# Patient Record
Sex: Male | Born: 1990 | Race: White | Hispanic: No | Marital: Single | Smoking: Current every day smoker
Health system: Southern US, Community
[De-identification: ages and names within clinical notes are randomized; demographics above are authoritative.]

## PROBLEM LIST (undated history)

## (undated) DIAGNOSIS — F1910 Other psychoactive substance abuse, uncomplicated: Secondary | ICD-10-CM

## (undated) DIAGNOSIS — F329 Major depressive disorder, single episode, unspecified: Secondary | ICD-10-CM

## (undated) DIAGNOSIS — R569 Unspecified convulsions: Secondary | ICD-10-CM

## (undated) DIAGNOSIS — F32A Depression, unspecified: Secondary | ICD-10-CM

## (undated) DIAGNOSIS — F419 Anxiety disorder, unspecified: Secondary | ICD-10-CM

## (undated) DIAGNOSIS — F1120 Opioid dependence, uncomplicated: Secondary | ICD-10-CM

## (undated) DIAGNOSIS — F1110 Opioid abuse, uncomplicated: Secondary | ICD-10-CM

## (undated) HISTORY — DX: Other psychoactive substance abuse, uncomplicated: F19.10

## (undated) HISTORY — PX: NO PAST SURGERIES: SHX2092

---

## 2015-12-03 ENCOUNTER — Encounter (HOSPITAL_COMMUNITY): Payer: Self-pay | Admitting: *Deleted

## 2015-12-03 ENCOUNTER — Observation Stay (HOSPITAL_COMMUNITY)
Admission: EM | Admit: 2015-12-03 | Discharge: 2015-12-03 | Disposition: A | Discharge status: Home / Self Care | Payer: Self-pay | Attending: Internal Medicine | Admitting: Internal Medicine

## 2015-12-03 DIAGNOSIS — F17210 Nicotine dependence, cigarettes, uncomplicated: Secondary | ICD-10-CM | POA: Insufficient documentation

## 2015-12-03 DIAGNOSIS — G928 Other toxic encephalopathy: Secondary | ICD-10-CM

## 2015-12-03 DIAGNOSIS — G929 Unspecified toxic encephalopathy: Secondary | ICD-10-CM | POA: Diagnosis present

## 2015-12-03 DIAGNOSIS — G92 Toxic encephalopathy: Secondary | ICD-10-CM | POA: Insufficient documentation

## 2015-12-03 DIAGNOSIS — T50902A Poisoning by unspecified drugs, medicaments and biological substances, intentional self-harm, initial encounter: Secondary | ICD-10-CM | POA: Diagnosis present

## 2015-12-03 DIAGNOSIS — T50992A Poisoning by other drugs, medicaments and biological substances, intentional self-harm, initial encounter: Principal | ICD-10-CM | POA: Insufficient documentation

## 2015-12-03 LAB — RAPID URINE DRUG SCREEN, HOSP PERFORMED
AMPHETAMINES: NOT DETECTED
BENZODIAZEPINES: NOT DETECTED
Barbiturates: NOT DETECTED
Cocaine: NOT DETECTED
Opiates: NOT DETECTED
TETRAHYDROCANNABINOL: NOT DETECTED

## 2015-12-03 LAB — COMPREHENSIVE METABOLIC PANEL
ALBUMIN: 4.8 g/dL (ref 3.5–5.0)
ALK PHOS: 57 U/L (ref 38–126)
ALT: 9 U/L — AB (ref 17–63)
AST: 19 U/L (ref 15–41)
Anion gap: 10 (ref 5–15)
BUN: 13 mg/dL (ref 6–20)
CALCIUM: 9.3 mg/dL (ref 8.9–10.3)
CHLORIDE: 105 mmol/L (ref 101–111)
CO2: 25 mmol/L (ref 22–32)
CREATININE: 1.08 mg/dL (ref 0.61–1.24)
GFR calc non Af Amer: >60 mL/min (ref >60)
GLUCOSE: 117 mg/dL — AB (ref 65–99)
Potassium: 4.3 mmol/L (ref 3.5–5.1)
SODIUM: 140 mmol/L (ref 135–145)
Total Bilirubin: 0.7 mg/dL (ref 0.3–1.2)
Total Protein: 7.7 g/dL (ref 6.5–8.1)

## 2015-12-03 LAB — CBC WITH DIFFERENTIAL/PLATELET
BASOS ABS: 0 10*3/uL (ref 0.0–0.1)
Basophils Relative: 0 %
EOS ABS: 0 10*3/uL (ref 0.0–0.7)
Eosinophils Relative: 0 %
HCT: 46.2 % (ref 39.0–52.0)
HEMOGLOBIN: 15.9 g/dL (ref 13.0–17.0)
LYMPHS ABS: 1.1 10*3/uL (ref 0.7–4.0)
Lymphocytes Relative: 19 %
MCH: 30.7 pg (ref 26.0–34.0)
MCHC: 34.4 g/dL (ref 30.0–36.0)
MCV: 89.2 fL (ref 78.0–100.0)
Monocytes Absolute: 0.4 10*3/uL (ref 0.1–1.0)
Monocytes Relative: 7 %
NEUTROS PCT: 74 %
Neutro Abs: 4.3 10*3/uL (ref 1.7–7.7)
Platelets: 247 10*3/uL (ref 150–400)
RBC: 5.18 MIL/uL (ref 4.22–5.81)
RDW: 12.1 % (ref 11.5–15.5)
WBC: 5.8 10*3/uL (ref 4.0–10.5)

## 2015-12-03 LAB — I-STAT CG4 LACTIC ACID, ED: Lactic Acid, Venous: 0.71 mmol/L (ref 0.5–2.0)

## 2015-12-03 LAB — ETHANOL: Alcohol, Ethyl (B): <5 mg/dL (ref <5)

## 2015-12-03 LAB — ACETAMINOPHEN LEVEL

## 2015-12-03 LAB — SALICYLATE LEVEL

## 2015-12-03 MED ORDER — SODIUM CHLORIDE 0.9 % IV SOLN
INTRAVENOUS | Status: DC
  Administered 2015-12-03: 08:00:00 via INTRAVENOUS

## 2015-12-03 MED ORDER — SODIUM CHLORIDE 0.9 % IV BOLUS (SEPSIS)
1000.0000 mL | Freq: Once | INTRAVENOUS | Status: AC
  Administered 2015-12-03: 1000 mL via INTRAVENOUS

## 2015-12-03 MED ORDER — ONDANSETRON HCL 4 MG/2ML IJ SOLN: 4.0000 mg | Freq: Four times a day (QID) | INTRAMUSCULAR | Status: DC | PRN

## 2015-12-03 MED ORDER — ENOXAPARIN SODIUM 40 MG/0.4ML ~~LOC~~ SOLN
40.0000 mg | SUBCUTANEOUS | Status: DC
  Filled 2015-12-03: qty 0.4

## 2015-12-03 MED ORDER — ALBUTEROL SULFATE (2.5 MG/3ML) 0.083% IN NEBU: 2.5000 mg | INHALATION_SOLUTION | RESPIRATORY_TRACT | Status: DC | PRN

## 2015-12-03 NOTE — ED Notes (Signed)
Pt comes to the ER for evaluation for ingesting a medication he ordered from a [facility]; pt states that he thinks it is "crystalized gabapentin"; pt staying at [facility] for [facility]; friends at house advised that pt was "not acting himself and was hallucinating"; pt states that it was liquid and he drank it; pt denies SI / HI; pt denies hallucinating at present; pt denies pain

## 2015-12-03 NOTE — Discharge Summary (Signed)
Mark Strickland, is a 24 y.o. male  DOB 23-Jul-1991  MRN [id number].  Admission date:  12/03/2015  Admitting Physician  Hillary BowJared M Gardner, DO  Discharge Date:  12/03/2015   Primary MD  No primary care provider on file.  Recommendations for primary care physician for things to follow:   Monitor recreational and prescription drug abuse   Admission Diagnosis  Drug overdose, intentional, initial encounter (HCC) [T50.902A] Drug-induced encephalopathy [G92]   Discharge Diagnosis  Drug overdose, intentional, initial encounter (HCC) [T50.902A] Drug-induced encephalopathy [G92]     Principal Problem:   Encephalopathy, toxic Active Problems:   Drug overdose, intentional (HCC)      History reviewed. No pertinent past medical history.  History reviewed. No pertinent past surgical history.     HPI  from the history and physical done on the day of admission:    Mark Strickland is a 24 y.o. male, with no past medical or surgical history except substance abuse, who was living in a group home which is called [facility] with his friends, apparently they ordered a mail order medication from [place] called Phenibut and he and his friend intentionally overdosed on this medication to get high, they had no intentions of hurting themselves whatsoever, he denies any depression, suicidal or homicidal ideation.  Thereafter he became more somnolent and was brought to the ER, in the ER his EKG, lab work were all unremarkable, liver panel was stable, salicylate and acetaminophen level were normal, ER physician discussed his case with poison control who recommended 36 hours monitoring for bradycardia and somnolence. Patient currently is minimally somnolent, easily arousable answering all questions and following all commands. Completely  symptom free. Again he denies any suicidal intent or ideation.     Hospital Course:     1. Intentional, recreational prescription medication overdose with Phenibut - per poison control this medication acts similarly to Neurontin - patient with a few other supportive care back to baseline, alert awake oriented 3, no problems or subjective complaints whatsoever. He again denies any suicidal ideation or intent. I also called and talked to his mother in detail. She agrees that this was recreational. He wishes to be discharged right away as he has to go for a job. Nurse as a witness in the room. He will be discharged with social work to counsel him prior to his discharge. I have consult him as well again states using any recreational drugs or abusing prescription drugs. He is already living at the addiction facility and he intends to go back there. He does not use this drug chronically he says. And it was given to him by a friend yesterday.  His case was closed by poison control as well.      Discharge Condition: Stable  Follow UP  Follow-up Information    Follow up with PCP and Psychiatry. Schedule an appointment as soon as possible for a visit in 1 week.       Consults obtained - S Work  Diet and Activity recommendation: See Discharge  Instructions below  Discharge Instructions       Discharge Instructions    Diet - low sodium heart healthy    Complete by:  As directed      Discharge instructions    Complete by:  As directed   Follow with Primary MD in 7 days   Get CBC, CMP, 2 view Chest X ray checked  by Primary MD next visit.    Activity: As tolerated with Full fall precautions use walker/cane & assistance as needed   Disposition Home     Diet:   Heart Healthy    For Heart failure patients - Check your Weight same time everyday, if you gain over 2 pounds, or you develop in leg swelling, experience more shortness of breath or chest pain, call your Primary MD immediately.  Follow Cardiac Low Salt Diet and 1.5 lit/day fluid restriction.   On your next visit with your primary care physician please Get Medicines reviewed and adjusted.   Please request your Prim.MD to go over all Hospital Tests and Procedure/Radiological results at the follow up, please get all Hospital records sent to your Prim MD by signing hospital release before you go home.   If you experience worsening of your admission symptoms, develop shortness of breath, life threatening emergency, suicidal or homicidal thoughts you must seek medical attention immediately by calling 911 or calling your MD immediately  if symptoms less severe.  You Must read complete instructions/literature along with all the possible adverse reactions/side effects for all the Medicines you take and that have been prescribed to you. Take any new Medicines after you have completely understood and accpet all the possible adverse reactions/side effects.   Do not drive, operating heavy machinery, perform activities at heights, swimming or participation in water activities or provide baby sitting services if your were admitted for syncope or siezures until you have seen by Primary MD or a Neurologist and advised to do so again.  Do not drive when taking Pain medications.    Do not take more than prescribed Pain, Sleep and Anxiety Medications  Special Instructions: If you have smoked or chewed Tobacco  in the last 2 yrs please stop smoking, stop any regular Alcohol  and or any Recreational drug use.  Wear Seat belts while driving.   Please note  You were cared for by a hospitalist during your hospital stay. If you have any questions about your discharge medications or the care you received while you were in the hospital after you are discharged, you can call the unit and asked to speak with the hospitalist on call if the hospitalist that took care of you is not available. Once you are discharged, your primary care physician  will handle any further medical issues. Please note that NO REFILLS for any discharge medications will be authorized once you are discharged, as it is imperative that you return to your primary care physician (or establish a relationship with a primary care physician if you do not have one) for your aftercare needs so that they can reassess your need for medications and monitor your lab values.     Increase activity slowly    Complete by:  As directed              Discharge Medications       Medication List    Notice    You have not been prescribed any medications.      Major procedures and Radiology Reports - PLEASE review detailed  and final reports for all details, in brief -    No results found.  Micro Results    No results found for this or any previous visit (from the past 240 hour(s)).  Today   Subjective    Mark Strickland today has no headache,no chest abdominal pain,no new weakness tingling or numbness, feels much better wants to go home today.     Objective   Blood pressure 123/70, pulse 83, temperature 98 F (36.7 C), temperature source Oral, resp. rate 15, weight 74.4 kg (164 lb 0.4 oz), SpO2 97 %.  No intake or output data in the 24 hours ending 12/03/15 1201  Exam Awake Alert, Oriented x 3, No new F.N deficits, Normal affect [place].AT,PERRAL Supple Neck,No JVD, No cervical lymphadenopathy appriciated.  Symmetrical Chest wall movement, Good air movement bilaterally, CTAB RRR,No Gallops,Rubs or new Murmurs, No Parasternal Heave +ve B.Sounds, Abd Soft, Non tender, No organomegaly appriciated, No rebound -guarding or rigidity. No Cyanosis, Clubbing or edema, No new Rash or bruise   Data Review   CBC w Diff:  Lab Results  Component Value Date   WBC 5.8 12/03/2015   HGB 15.9 12/03/2015   HCT 46.2 12/03/2015   PLT 247 12/03/2015   LYMPHOPCT 19 12/03/2015   MONOPCT 7 12/03/2015   EOSPCT 0 12/03/2015   BASOPCT 0 12/03/2015    CMP:  Lab Results    Component Value Date   NA 140 12/03/2015   K 4.3 12/03/2015   CL 105 12/03/2015   CO2 25 12/03/2015   BUN 13 12/03/2015   CREATININE 1.08 12/03/2015   PROT 7.7 12/03/2015   ALBUMIN 4.8 12/03/2015   BILITOT 0.7 12/03/2015   ALKPHOS 57 12/03/2015   AST 19 12/03/2015   ALT 9* 12/03/2015  .   Total Time in preparing paper work, data evaluation and todays exam - 35 minutes  Leroy Sea M.D on 12/03/2015 at 12:01 PM  [facility]   Office  [phone]

## 2015-12-03 NOTE — ED Provider Notes (Signed)
Patient presents from [facility] with a roommate after taking phenibut which they ordered online from [place]. They're roommates state they took it about 5 PM.  Patient is lying quietly in the stretcher. When he is addressed he keeps repeating the same sentence over and over again which sounds like "they went that away". When asked if he knows where he is he states yes however he states the same sentence over again.  Medical screening examination/treatment/procedure(s) were conducted as a shared visit with non-physician practitioner(s) and myself.  I personally evaluated the patient during the encounter.   EKG Interpretation   Date/Time:  Thursday December 03 2015 02:15:25 EST Ventricular Rate:  67 PR Interval:  128 QRS Duration: 97 QT Interval:  432 QTC Calculation: 456 R Axis:   76 Text Interpretation:  Sinus rhythm Baseline wander in lead(s) III aVL aVF  Otherwise within normal limits No old tracing to compare Confirmed by  Hermenegildo Clausen  MD-I, Kamarius Buckbee ([id number]) on 12/03/2015 2:25:49 AM       Devoria AlbeIva Deigo Alonso, MD, Concha PyoFACEP   Analiese Krupka, MD 12/03/15 [phone]

## 2015-12-03 NOTE — ED Notes (Signed)
Pt. Made aware for the need of urine. 

## 2015-12-03 NOTE — Progress Notes (Signed)
Mark Strickland to be D/C'd Home per MD order.  Discussed prescriptions and follow up appointments with the patient. Prescriptions given to patient, medication list explained in detail. Pt verbalized understanding.    Medication List    Notice    You have not been prescribed any medications.      Filed Vitals:   12/03/15 0600 12/03/15 0653  BP: 112/70 123/70  Pulse: 70 83  Temp:  98 F (36.7 C)  Resp: 11 15    Skin clean, dry and intact without evidence of skin break down, no evidence of skin tears noted. IV catheter discontinued intact. Site without signs and symptoms of complications. Dressing and pressure applied. Pt denies pain at this time. No complaints noted.  An After Visit Summary was printed and given to the patient. Patient escorted via WC, and D/C home via private auto.  Mark Strickland, Mark Strickland 12/03/2015 12:25 PM

## 2015-12-03 NOTE — H&P (Signed)
Patient Demographics:    Mark Strickland, is a 24 y.o. male  MRN: [id number]   DOB - [birth year]  Admit Date - 12/03/2015  Outpatient Primary MD for the patient is No primary care provider on file.   With History of -  History reviewed. No pertinent past medical history.    History reviewed. No pertinent past surgical history.  in for   Chief Complaint  Patient presents with  . Ingestion      HPI:    Mark Strickland  is a 24 y.o. male, with no past medical or surgical history except substance abuse, who was living in a group home which is called [facility] with his friends, apparently they ordered a mail order medication from [place] called Phenibut and he and his friend intentionally overdosed on this medication to get high, they had no intentions of hurting themselves whatsoever, he denies any depression, suicidal or homicidal ideation.  Thereafter he became more somnolent and was brought to the ER, in the ER his EKG, lab work were all unremarkable, liver panel was stable, salicylate and acetaminophen level were normal, ER physician discussed his case with poison control who recommended 36 hours monitoring for bradycardia and somnolence. Patient currently is minimally somnolent, easily arousable answering all questions and following all commands. Completely symptom free. Again he denies any suicidal intent or ideation.    Review of systems:    In addition to the HPI above,  No Fever-chills, No Headache, No changes with Vision or hearing, No problems swallowing food or Liquids, No Chest pain, Cough or Shortness of Breath, No Abdominal pain, No Nausea or Vommitting, Bowel movements are regular, No Blood in stool or Urine, No dysuria, No new skin rashes or bruises, No new joints pains-aches,  No new  weakness, tingling, numbness in any extremity, No recent weight gain or loss, No polyuria, polydypsia or polyphagia, No significant Mental Stressors.  A full 10 point Review of Systems was done, except as stated above, all other Review of Systems were negative.    Social History:     Social History  Substance Use Topics  . Smoking status: Current Every Day Smoker -- 1.00 packs/day    Types: Cigarettes  . Smokeless tobacco: Not on file  . Alcohol Use: Yes    Lives - at a sober house with friends      Family History :   Denies any depression history   Home Medications:   Prior to Admission medications   Not on File     Allergies:    No Known Allergies   Physical Exam:   Vitals  Blood pressure 123/70, pulse 83, temperature 98 F (36.7 C), temperature source Oral, resp. rate 15, weight 74.4 kg (164 lb 0.4 oz), SpO2 97 %.   1. General young white male lying in bed in NAD, mildly somnolent  2. Normal affect and insight, Not Suicidal or Homicidal, Awake  Alert, Oriented X 3.  3. No F.N deficits, ALL C.Nerves Intact, Strength 5/5 all 4 extremities, Sensation intact all 4 extremities, Plantars down going.  4. Ears and Eyes appear Normal, Conjunctivae clear, PERRLA. Moist Oral Mucosa.  5. Supple Neck, No JVD, No cervical lymphadenopathy appriciated, No Carotid Bruits.  6. Symmetrical Chest wall movement, Good air movement bilaterally, CTAB.  7. RRR, No Gallops, Rubs or Murmurs, No Parasternal Heave.  8. Positive Bowel Sounds, Abdomen Soft, No tenderness, No organomegaly appriciated,No rebound -guarding or rigidity.  9.  No Cyanosis, Normal Skin Turgor, No Skin Rash or Bruise.  10. Good muscle tone,  joints appear normal , no effusions, Normal ROM.  11. No Palpable Lymph Nodes in Neck or Axillae     Data Review:    CBC  Recent Labs Lab 12/03/15 0234  WBC 5.8  HGB 15.9  HCT 46.2  PLT 247  MCV 89.2  MCH 30.7  MCHC 34.4  RDW 12.1  LYMPHSABS 1.1    MONOABS 0.4  EOSABS 0.0  BASOSABS 0.0   ------------------------------------------------------------------------------------------------------------------  Chemistries   Recent Labs Lab 12/03/15 0234  NA 140  K 4.3  CL 105  CO2 25  GLUCOSE 117*  BUN 13  CREATININE 1.08  CALCIUM 9.3  AST 19  ALT 9*  ALKPHOS 57  BILITOT 0.7   ------------------------------------------------------------------------------------------------------------------ CrCl cannot be calculated (Unknown ideal weight.). ------------------------------------------------------------------------------------------------------------------ No results for input(s): TSH, T4TOTAL, T3FREE, THYROIDAB in the last 72 hours.  Invalid input(s): FREET3   Coagulation profile No results for input(s): INR, PROTIME in the last 168 hours. ------------------------------------------------------------------------------------------------------------------- No results for input(s): DDIMER in the last 72 hours. -------------------------------------------------------------------------------------------------------------------  Cardiac Enzymes No results for input(s): CKMB, TROPONINI, MYOGLOBIN in the last 168 hours.  Invalid input(s): CK ------------------------------------------------------------------------------------------------------------------ Invalid input(s): POCBNP   ---------------------------------------------------------------------------------------------------------------  Urinalysis No results found for: COLORURINE, APPEARANCEUR, LABSPEC, PHURINE, GLUCOSEU, HGBUR, BILIRUBINUR, KETONESUR, PROTEINUR, UROBILINOGEN, NITRITE, LEUKOCYTESUR  ----------------------------------------------------------------------------------------------------------------   Imaging Results:    No results found.  My personal review of EKG: Rhythm NSR,  no Acute ST changes   Assessment & Plan:     1. Intentional, recreational  prescription medication overdose with Phenibut - per poison control this medication acts similarly to Neurontin, patient to be monitored for 36 hours for somnolence and bradycardia. We will observe him for his 3 hours with IV fluids, keep him on clear liquids still he is more awake. Monitor on telemetry. Denies any suicidal ideation or intent. Counseled not to abuse medications. Likely discharge in the morning.   DVT Prophylaxis  Lovenox   AM Labs Ordered, also please review Full Orders  Family Communication: Admission, patients condition and plan of care including tests being ordered have been discussed with the patient who indicates understanding and agree with the plan and Code Status.  Code Status Full  Likely DC to  Home 1-2 days  Condition Fair  Time spent in minutes : 35    SINGH,PRASHANT K M.D on 12/03/2015 at 8:33 AM  Between 7am to 7pm - Pager - [phone]  After 7pm go to [email or web address] - password [email or web address]  [facility] - Office  [phone]

## 2015-12-03 NOTE — ED Notes (Addendum)
Spoke with Lorri from Poison Control, monitor -Medical clearance labs -EKG -Seizure precautions -Benzo's for any seizure activities -Lorri will call back with further information 

## 2015-12-03 NOTE — ED Provider Notes (Signed)
CSN: [id number]     Arrival date & time 12/03/15  0200 History   First MD Initiated Contact with Patient 12/03/15 0203     Chief Complaint  Patient presents with  . Ingestion    Level V caveat applies secondary to altered mental status from overdose  (Consider location/radiation/quality/duration/timing/severity/associated sxs/prior Treatment) HPI Comments: 24 year old male with no known past medical history presents to the emergency department secondary to overdose. Patient reports ingesting "crystallized gabapentin". It was later discovered from friends with whom the patient lives that he took a large amount of Phenibut. Friends report the patient taking "about 10 scoops mixed with water". Patient reports that ingestion was 8 hours ago. Friends state that he ingested Phenibut at approximately 1700 yesterday. Patient denies pain or hallucinations. He states he "doesn't know" why he ingested this, but he denies SI. Friends report that the patient is on multiple medications for depression and PTSD. Patient denies hospitalizations for such. He reports ETOH ingestion tonight, but friends "doubt it".   Patient is a 24 y.o. male presenting with Ingested Medication. The history is provided by the patient and a friend. No language interpreter was used.  Ingestion Pertinent negatives include no abdominal pain or chest pain.    History reviewed. No pertinent past medical history. History reviewed. No pertinent past surgical history. No family history on file. Social History  Substance Use Topics  . Smoking status: Current Every Day Smoker -- 1.00 packs/day    Types: Cigarettes  . Smokeless tobacco: None  . Alcohol Use: Yes    Review of Systems  Unable to perform ROS: Mental status change  Respiratory: Negative for shortness of breath.   Cardiovascular: Negative for chest pain.  Gastrointestinal: Negative for abdominal pain.  Psychiatric/Behavioral: Negative for suicidal ideas.  AMS  secondary to overdose   Allergies  Review of patient's allergies indicates no known allergies.  Home Medications   Prior to Admission medications   Not on File   BP 121/85 mmHg  Pulse 66  Temp(Src) 97.5 F (36.4 C) (Oral)  Resp 15  SpO2 97%   Physical Exam  Constitutional: He is oriented to person, place, and time. He appears well-developed and well-nourished. No distress.  Nontoxic/nonseptic appearing  HENT:  Head: Normocephalic and atraumatic.  Eyes: Conjunctivae and EOM are normal. No scleral icterus.  Dilated, reactive pupils b/l  Neck: Normal range of motion.  Cardiovascular: Normal rate, regular rhythm and intact distal pulses.   Pulmonary/Chest: Effort normal. No respiratory distress.  Respirations even and unlabored  Abdominal: Soft. He exhibits no distension. There is no tenderness.  Soft, nontender abdomen  Musculoskeletal: Normal range of motion.  Neurological: He is alert and oriented to person, place, and time. He exhibits normal muscle tone. Coordination normal.  Speech is goal oriented. Patient ambulatory, unassisted.  Skin: Skin is warm and dry. No rash noted. He is not diaphoretic. No erythema. No pallor.  Psychiatric: His speech is normal and behavior is normal. He exhibits a depressed mood. He expresses no homicidal and no suicidal ideation.  Patient appears dazed.  Nursing note and vitals reviewed.   ED Course  Procedures (including critical care time) Labs Review Labs Reviewed  COMPREHENSIVE METABOLIC PANEL - Abnormal; Notable for the following:    Glucose, Bld 117 (*)    ALT 9 (*)    All other components within normal limits  ACETAMINOPHEN LEVEL - Abnormal; Notable for the following:    Acetaminophen (Tylenol), Serum <10 (*)    All other components  within normal limits  CBC WITH DIFFERENTIAL/PLATELET  ETHANOL  SALICYLATE LEVEL  URINE RAPID DRUG SCREEN, HOSP PERFORMED  I-STAT CG4 LACTIC ACID, ED  I-STAT CG4 LACTIC ACID, ED    Imaging  Review No results found.   I have personally reviewed and evaluated these images and lab results as part of my medical decision-making.   EKG Interpretation   Date/Time:  Thursday December 03 2015 02:15:25 EST Ventricular Rate:  67 PR Interval:  128 QRS Duration: 97 QT Interval:  432 QTC Calculation: 456 R Axis:   76 Text Interpretation:  Sinus rhythm Baseline wander in lead(s) III aVL aVF  Otherwise within normal limits No old tracing to compare Confirmed by  KNAPP  MD-I, IVA ([id number]) on 12/03/2015 2:25:49 AM      0230 - Spoke with Patty of poison control. She reports that there is no concern for cardiac toxicity. She states that there are few cases of overdose on this substance, that supportive care is usually adequate. Poison control recommends admission to telemetry on pulse oximetry if patient is not back to baseline within 6 hours postingestion. Phenibut has been known to modulate the function of some epilepsy medications. Benzodiazepines recommended for any seizure-like activity.  [id number] - Patient easily arousable to voice. His speech is clear, though he still seems a bit altered; "dazed". Consulted with hospitalist for admission. Dr. Julian ReilGardner to assess; however on further thought, I believe the patient may be appropriate for discharge closer to 0600 if allowed to further sober and return to baseline.  Vestalis.[email or web address] - Patient has been reassessed. He will answer "yes"/"no" questions, but will freely speak in garbled speech that is nonsensical. I believe the patient has worsened since his arrival in the ED. I have spoken to Dr. Julian ReilGardner about this patient and his worsening condition as he is performing duties in our ED physician room. He will attempt to assess patient for admission. Patient with no respiratory distress or depression. No airway compromise.  MDM   Final diagnoses:  Drug overdose, intentional, initial encounter (HCC)  Drug-induced encephalopathy    Patient presenting for  Phenibut overdose. Ingestion at approximately 1700. Patient denies SI. He is in no respiratory distress and has no airway compromise, though his mentation and speech have worsened during ED observation. Will admit for monitoring until drug metabolized; can take up to 36 hours per poison control. Medication works similar to gabapentin and baclofen, per research. No concern for cardiac toxicity though bradycardia has been noted to be associated with overdose. Per poison control, intubation is a RARE occurrence and supportive care is usually adequate.   Filed Vitals:   12/03/15 0219  BP: 121/85  Pulse: 66  Temp: 97.5 F (36.4 C)  TempSrc: Oral  Resp: 15  SpO2: 97%     Antony MaduraKelly Edwina Grossberg, PA-C 12/03/15 0530  Devoria AlbeIva Knapp, MD 12/03/15 [phone]

## 2015-12-03 NOTE — ED Notes (Signed)
RN called floor to give report; no answer

## 2015-12-03 NOTE — Clinical Social Work Note (Signed)
Clinical Social Work Assessment  Patient Details  Name: Mark Strickland MRN: [id number] Date of Birth: 03-08-1991  Date of referral:  12/03/15               Reason for consult:  Substance Use/ETOH Abuse                Permission sought to share information with:  Other (Not Applicable ) Permission granted to share information::  No  Name::        Agency::     Relationship::     Contact Information:     Housing/Transportation Living arrangements for the past 2 months:  [place] (Pt resides the [facility]) Source of Information:  Patient Patient Interpreter Needed:  None Criminal Activity/Legal Involvement Pertinent to Current Situation/Hospitalization:  No - Comment as needed Significant Relationships:  Parents Lives with:  Roommate Do you feel safe going back to the place where you live?  Yes Need for family participation in patient care:  No (Coment)  Care giving concerns: Pt does not have any care giving concerns at this time.   Social Worker assessment / plan: CSW met with the Pt at the bedside for assessment and d/c planning. CSW introduced self and reason for referral. Pt was very receptive to CSW and was willing to explain the circumstances for this admission. Pt began explaining reason for hospitalization and immediately became tearful. Pt stated that he was with his roommate yesterday evening after work. Pt stated that when he came home there was a package for his roommate on the doorstep and Pt brought the package in to his roommate. Pt denied any knowledge of what "herbal supplement" was in the package. Pt stated that Pt's roommate offered the Pt some of the supplement to assist with his anxiety. Pt's roommate "instructed the Pt on how much to take" and Pt did not read medication/herbal supplement label. Pt stated that medication/supplement was in a powder form and was to be mixed with water. Pt stated he added "10 one ounce scoops to water and drank it."Pt claimed he was,  "able to go to work and complete his shift." Pt stated he began to feel "uneasy" and began having some unsteadiness. Pt went home after his shift and thru up several times. Pt then stated he went to his nightly meeting. Pt was not able to remain at the meeting and returned home where he began throwing up again. Pt stated that while he was talking with someone at the [facility] he began having a difficult time speaking and "became worse." Pt allowed someone to call the ambulance for him. Pt was crying while describing the events and when CSW asked what was upsetting him, Pt explained that Pt did not want anyone to think he "relapsed." Pt did express several close relationships with the house members and is concerned with what their view of him might be. CSW provided solution focused interventions that will assist with interacting appropriately with those house members. Pt does want to continue with his sobriety and also wants to return to the [facility]. Pt denies any use of alcohol at the facility and while in the program. Pt is able to continue working and has strong relational bond with his mother. Pt is able to be d/c'd back to facility today. CSW sees no barriers to discharge.  Employment status:  [employer] information:  Self Pay (Medicaid Pending) PT Recommendations:  Not assessed at this time Information / Referral to community resources:  Other (Comment Required) (None needed as Pt will be returning to a treatment facility for further rehab and support with sobriety)  Patient/Family's Response to care: Pt is visibly distraught about the decision to take medication and was tearful throughout the assessment.   Patient/Family's Understanding of and Emotional Response to Diagnosis, Current Treatment, and Prognosis:  Pt understands the seriousness of what happened and "knows now not to trust his roommate."   Emotional Assessment Appearance:  Appears stated age Attitude/Demeanor/Rapport:   Crying, Other (Feeling shame and regret. Pt feels as though he will "be looked down on" when he returns. ) Affect (typically observed):  Anxious, Sad, Tearful/Crying Orientation:  Oriented to Self, Oriented to Place, Oriented to  Time, Oriented to Situation Alcohol / Substance use:  Illicit Drugs (Pt stated he believed "he had taken a herbal supliment and did not know , "it was what it was." Pt did not claim to know what the medication was he had taken. ) Psych involvement (Current and /or in the community):  No (Comment)  Discharge Needs  Concerns to be addressed:  Decision making concerns, Coping/Stress Concerns, Substance Abuse Concerns Readmission within the last 30 days:  No Current discharge risk:  None Barriers to Discharge:  No Barriers Identified   Mark Strickland 12/03/2015, 12:35 PM

## 2015-12-03 NOTE — Discharge Instructions (Signed)

## 2016-06-21 ENCOUNTER — Emergency Department
Admission: EM | Admit: 2016-06-21 | Discharge: 2016-06-21 | Disposition: A | Discharge status: Home / Self Care | Payer: MEDICAID | Attending: Emergency Medicine | Admitting: Emergency Medicine

## 2016-06-21 ENCOUNTER — Encounter: Payer: Self-pay | Admitting: *Deleted

## 2016-06-21 DIAGNOSIS — F17210 Nicotine dependence, cigarettes, uncomplicated: Secondary | ICD-10-CM | POA: Insufficient documentation

## 2016-06-21 DIAGNOSIS — Z79899 Other long term (current) drug therapy: Secondary | ICD-10-CM | POA: Insufficient documentation

## 2016-06-21 DIAGNOSIS — F1320 Sedative, hypnotic or anxiolytic dependence, uncomplicated: Secondary | ICD-10-CM

## 2016-06-21 DIAGNOSIS — F1120 Opioid dependence, uncomplicated: Secondary | ICD-10-CM | POA: Insufficient documentation

## 2016-06-21 DIAGNOSIS — F329 Major depressive disorder, single episode, unspecified: Secondary | ICD-10-CM | POA: Insufficient documentation

## 2016-06-21 HISTORY — DX: Anxiety disorder, unspecified: F41.9

## 2016-06-21 HISTORY — DX: Major depressive disorder, single episode, unspecified: F32.9

## 2016-06-21 HISTORY — DX: Depression, unspecified: F32.A

## 2016-06-21 LAB — CBC WITH DIFFERENTIAL/PLATELET
Basophils Absolute: 0 10*3/uL (ref 0–0.1)
Basophils Relative: 0 %
EOS ABS: 0 10*3/uL (ref 0–0.7)
Eosinophils Relative: 1 %
HCT: 39.8 % — ABNORMAL LOW (ref 40.0–52.0)
HEMOGLOBIN: 13.3 g/dL (ref 13.0–18.0)
LYMPHS ABS: 1.5 10*3/uL (ref 1.0–3.6)
LYMPHS PCT: 27 %
MCH: 29.5 pg (ref 26.0–34.0)
MCHC: 33.4 g/dL (ref 32.0–36.0)
MCV: 88.4 fL (ref 80.0–100.0)
Monocytes Absolute: 0.5 10*3/uL (ref 0.2–1.0)
Monocytes Relative: 10 %
NEUTROS PCT: 62 %
Neutro Abs: 3.4 10*3/uL (ref 1.4–6.5)
Platelets: 355 10*3/uL (ref 150–440)
RBC: 4.5 MIL/uL (ref 4.40–5.90)
RDW: 12.9 % (ref 11.5–14.5)
WBC: 5.5 10*3/uL (ref 3.8–10.6)

## 2016-06-21 LAB — URINE DRUG SCREEN, QUALITATIVE (ARMC ONLY)
AMPHETAMINES, UR SCREEN: NOT DETECTED
Barbiturates, Ur Screen: NOT DETECTED
Benzodiazepine, Ur Scrn: POSITIVE — AB
CANNABINOID 50 NG, UR ~~LOC~~: NOT DETECTED
COCAINE METABOLITE, UR ~~LOC~~: NOT DETECTED
MDMA (ECSTASY) UR SCREEN: NOT DETECTED
Methadone Scn, Ur: NOT DETECTED
Opiate, Ur Screen: POSITIVE — AB
Phencyclidine (PCP) Ur S: NOT DETECTED
TRICYCLIC, UR SCREEN: NOT DETECTED

## 2016-06-21 LAB — BASIC METABOLIC PANEL
ANION GAP: 8 (ref 5–15)
BUN: 20 mg/dL (ref 6–20)
CHLORIDE: 101 mmol/L (ref 101–111)
CO2: 28 mmol/L (ref 22–32)
Calcium: 8.6 mg/dL — ABNORMAL LOW (ref 8.9–10.3)
Creatinine, Ser: 1.53 mg/dL — ABNORMAL HIGH (ref 0.61–1.24)
GFR calc Af Amer: >60 mL/min (ref >60)
GFR calc non Af Amer: >60 mL/min (ref >60)
Glucose, Bld: 98 mg/dL (ref 65–99)
POTASSIUM: 4.2 mmol/L (ref 3.5–5.1)
SODIUM: 137 mmol/L (ref 135–145)

## 2016-06-21 LAB — ETHANOL: Alcohol, Ethyl (B): <5 mg/dL (ref <5)

## 2016-06-21 LAB — SALICYLATE LEVEL

## 2016-06-21 LAB — ACETAMINOPHEN LEVEL

## 2016-06-21 MED ORDER — ACETAMINOPHEN 500 MG PO TABS: 1000.0000 mg | ORAL_TABLET | Freq: Once | ORAL | Status: DC

## 2016-06-21 MED ORDER — NICOTINE 21 MG/24HR TD PT24
21.0000 mg | MEDICATED_PATCH | Freq: Once | TRANSDERMAL | Status: DC
  Administered 2016-06-21: 21 mg via TRANSDERMAL
  Filled 2016-06-21: qty 1

## 2016-06-21 NOTE — ED Notes (Signed)
States he is looking for medical clearance for [facility], states he is seeking detox for benzos and opiates, states he last took pills this AM

## 2016-06-21 NOTE — BH Assessment (Signed)
Tele Assessment Note   Mark Strickland is an 25 y.o. maleCaucasian, single who [facility] for medical clearance for acceptance detox/ residential at [facility]. Patient states does have history of depression and is prescribed anti-depressant. Patient primary compliant is of S.A. And inability to prevent relapse. Patient currently resides at [facility]. Patient states that he relapsed and started on heroin, and has been using heroin intravenously for 2 weeks with 1 gram per day and last use on 06-20-16 with 1 gram heroin. Patient states that he uses Xanex x 4 pills per day for years since the age of 25 with last use on 06-20-16.  Patient reports current change in sleep patterns with loss of sleep nightly.  Patient denies current SI/HI or history of and AVH. Patient acknowledges history of substance abuse with no history of inpatient or outpatient psychiatric treatment.  Patient is dressed in scrubs and is alert and oriented x4. Patient speech was within normal limits and motor behavior appeared normal. Patient thought process is coherent. Patient does not appear to be responding to internal stimuli. Patient was cooperative throughout the assessment.  Diagnosis: Major Depressive Disorder, Opiate Use, Severe, Benzo Use Disorder, Severe  Past Medical History:  Past Medical History  Diagnosis Date  . Depressed   . Anxiety     History reviewed. No pertinent past surgical history.  Family History: History reviewed. No pertinent family history.  Social History:  reports that he has been smoking Cigarettes.  He has been smoking about 1.00 pack per day. He does not have any smokeless tobacco history on file. He reports that he drinks alcohol. He reports that he uses illicit drugs.  Additional Social History:  Alcohol / Drug Use Pain Medications: SEE MAR Prescriptions: SEE MAR Over the Counter: SEE MAR History of alcohol / drug use?: Yes Longest period of sobriety (when/how long): 9  months 2016 Negative Consequences of Use: Financial, Personal relationships, Armed forces operational officerLegal, Work / School Substance #1 Name of Substance 1: heroine 1 - Age of First Use: 25 1 - Amount (size/oz): 1 gram daily intravenously 1 - Frequency: daily 1 - Duration: month 1 - Last Use / Amount: 06-20-16 Substance #2 Name of Substance 2: Benzos Xanex 2 - Age of First Use: 13 2 - Amount (size/oz): x 4 pills 2 - Frequency: daily 2 - Duration: ongoing years 2 - Last Use / Amount: 06-20-16  CIWA: CIWA-Ar BP: 126/69 mmHg Pulse Rate: 94 COWS:    PATIENT STRENGTHS: (choose at least two) Active sense of humor Average or above average intelligence Capable of independent living  Allergies: No Known Allergies  Home Medications:  (Not in a hospital admission)  OB/GYN Status:  No LMP for male patient.  General Assessment Data [facility] Assessment: In system Is this a Tele or Face-to-Face Assessment?: Face-to-Face Is this an Initial Assessment or a Re-assessment for this encounter?: Initial Assessment Marital status: Single Maiden name: n/a Is patient pregnant?: No Pregnancy Status: No Living Arrangements: Other (Comment) ([facility]) Can pt return to current living arrangement?: Yes Admission Status: Voluntary Is patient capable of signing voluntary admission?: Yes Referral Source: Self/Family/Friend Insurance type: SP  Medical Screening Exam [facility] Walk-in ONLY) Medical Exam completed: Yes  Crisis Care Plan Living Arrangements: Other (Comment) ([facility]) Name of Psychiatrist: none Name of Therapist: none  Education Status Is patient currently in school?: Yes Current Grade: college Highest grade of school patient has completed: some college Name of  school: unspecified Contact person: mother, Marisa SeverinYolanda Collins  Risk to self with the past 6 months Suicidal Ideation: No Has patient been a risk to self within the past 6 months prior to  admission? : No Suicidal Intent: No-Not Currently/Within Last 6 Months Has patient had any suicidal intent within the past 6 months prior to admission? : No Is patient at risk for suicide?: No Suicidal Plan?: No Has patient had any suicidal plan within the past 6 months prior to admission? : No Access to Means: No What has been your use of drugs/alcohol within the last 12 months?: yes Previous Attempts/Gestures: No How many times?: 0 Other Self Harm Risks: none noted Triggers for Past Attempts: Unknown Intentional Self Injurious Behavior: None Family Suicide History: No Recent stressful life event(s): Conflict (Comment) Persecutory voices/beliefs?: No Depression: Yes Depression Symptoms: Despondent, Insomnia, Tearfulness, Isolating, Fatigue, Guilt, Loss of interest in usual pleasures, Feeling worthless/self pity Substance abuse history and/or treatment for substance abuse?: Yes Suicide prevention information given to non-admitted patients: Not applicable  Risk to Others within the past 6 months Homicidal Ideation: No Does patient have any lifetime risk of violence toward others beyond the six months prior to admission? : No Thoughts of Harm to Others: No Current Homicidal Intent: No Current Homicidal Plan: No Access to Homicidal Means: No Identified Victim: n/a History of harm to others?: No Assessment of Violence: None Noted Violent Behavior Description: n/a Does patient have access to weapons?: No Criminal Charges Pending?: No Does patient have a court date: No Is patient on probation?: No  Psychosis Hallucinations: None noted Delusions: None noted  Mental Status Report Appearance/Hygiene: In scrubs Eye Contact: Poor Motor Activity: Unsteady Speech: Logical/coherent Level of Consciousness: Alert Mood: Depressed, Anxious Affect: Anxious, Depressed Anxiety Level: Panic Attacks Panic attack frequency: weekly Most recent panic attack: 06-18-16 Thought Processes:  Coherent, Relevant Judgement: Partial Orientation: Person, Place, Time, Situation, Appropriate for developmental age Obsessive Compulsive Thoughts/Behaviors: None  Cognitive Functioning Concentration: Decreased Memory: Recent Intact, Remote Intact IQ: Average Insight: Poor Impulse Control: Poor Appetite: Fair Weight Loss: 0 Weight Gain: 0 Sleep: Decreased Total Hours of Sleep: 4 Vegetative Symptoms: None  ADLScreening [facility] Assessment Services) Patient's cognitive ability adequate to safely complete daily activities?: Yes Patient able to express need for assistance with ADLs?: Yes Independently performs ADLs?: Yes (appropriate for developmental age)  Prior Inpatient Therapy Prior Inpatient Therapy: No Prior Therapy Dates: n/a Prior Therapy Facilty/Provider(s): n/a Reason for Treatment: n/a  Prior Outpatient Therapy Prior Outpatient Therapy: No Prior Therapy Dates: n/a Prior Therapy Facilty/Provider(s): n/a Reason for Treatment: n/a Does patient have an ACCT team?: No Does patient have Intensive In-House Services?  : No Does patient have [facility] services? : No Does patient have P4CC services?: No  ADL Screening (condition at time of admission) Patient's cognitive ability adequate to safely complete daily activities?: Yes Is the patient deaf or have difficulty hearing?: No Does the patient have difficulty seeing, even when wearing glasses/contacts?: Yes Does the patient have difficulty concentrating, remembering, or making decisions?: No Patient able to express need for assistance with ADLs?: Yes Does the patient have difficulty dressing or bathing?: No Independently performs ADLs?: Yes (appropriate for developmental age) Does the patient have difficulty walking or climbing stairs?: No Weakness of Legs: None Weakness of Arms/Hands: None  Home Assistive Devices/Equipment Home Assistive Devices/Equipment: None    Abuse/Neglect Assessment (Assessment to be complete  while patient is alone) Physical Abuse: Denies Verbal Abuse: Denies Sexual Abuse: Denies Exploitation of patient/patient's resources: Denies  Self-Neglect: Denies Values / Beliefs Cultural Requests During Hospitalization: None Spiritual Requests During Hospitalization: None   Advance Directives (For Healthcare) Does patient have an advance directive?: No Would patient like information on creating an advanced directive?: No - patient declined information    Additional Information 1:1 In Past 12 Months?: No CIRT Risk: No Elopement Risk: No Does patient have medical clearance?: Yes     Disposition:  Disposition Initial Assessment Completed for this Encounter: Yes Disposition of Patient: Outpatient treatment Type of outpatient treatment: Adult  Mark Strickland 06/21/2016 7:36 PM

## 2016-06-21 NOTE — ED Notes (Signed)
Pt in via triage; pt reports being here for medical clearance to go to [facility].  Pt reports use of heroin and xanax.  Pt A/Ox4, calm, cooperative, no immediate distress at this time.

## 2016-06-21 NOTE — Discharge Instructions (Signed)
You have been seen in the Emergency Department (ED) today for substance abuse.  You have been evaluated by the behavioral medicine specialists and are being [facility]  Please return to the ED immediately if you have ANY thoughts of hurting yourself or anyone else, so that we may help you.  Please avoid alcohol and drug use.  Follow up with your doctor and/or therapist as soon as possible regarding today's ED  visit.   Please follow up any other recommendations and clinic appointments provided by the psychiatry team that saw you in the Emergency Department.   Opioid Use Disorder Opioid use disorder is a mental disorder. It is the continued nonmedical use of opioids in spite of risks to health and well-being. Misused opioids include the street drug heroin. They also include pain medicines such as morphine, hydrocodone, oxycodone, and fentanyl. Opioids are very addictive. People who misuse opioids get an exaggerated feeling of well-being. Opioid use disorder often disrupts activities at home, work, or school. It may cause mental or physical problems.  A family history of opioid use disorder puts you at higher risk of it. People with opioid use disorder often misuse other drugs or have mental illness such as depression, posttraumatic stress disorder, or antisocial personality disorder. They also are at risk of suicide and death from overdose. SIGNS AND SYMPTOMS  Signs and symptoms of opioid use disorder include:  Use of opioids in larger amounts or over a longer period than intended.  Unsuccessful attempts to cut down or control opioid use.  A lot of time spent obtaining, using, or recovering from the effects of opioids.  A strong desire or urge to use opioids (craving).  Continued use of opioids in spite of major problems at work, school, or home because of use.  Continued use of opioids in spite of relationship problems because of use.  Giving up or  cutting down on important life activities because of opioid use.  Use of opioids over and over in situations when it is physically hazardous, such as driving a car.  Continued use of opioids in spite of a physical problem that is likely related to use. Physical problems can include:  Severe constipation.  Poor nutrition.  Infertility.  Tuberculosis.  Aspiration pneumonia.  Infections such as human immunodeficiency virus (HIV) and hepatitis (from injecting opioids).  Continued use of opioids in spite of a mental problem that is likely related to use. Mental problems can include:  Depression.  Anxiety.  Hallucinations.  Sleep problems.  Loss of sexual function.  Need to use more and more opioids to get the same effect, or lessened effect over time with use of the same amount (tolerance).  Having withdrawal symptoms when opioid use is stopped, or using opioids to reduce or avoid withdrawal symptoms. Withdrawal symptoms include:  Depressed, anxious, or irritable mood.  Nausea, vomiting, diarrhea, or intestinal cramping.  Muscle aches or spasms.  Excessive tearing or runny nose.  Dilated pupils, sweating, or hairs standing on end.  Yawning.  Fever, raised blood pressure, or fast pulse.  Restlessness or trouble sleeping. This does not apply to people taking opioids for medical reasons only. DIAGNOSIS Opioid use disorder is diagnosed by your health care provider. You may be asked questions about your opioid use and and how it affects your life. A physical exam may be done. A drug screen may be ordered. You may be referred to a mental health professional. The diagnosis of opioid use disorder requires  at least two symptoms within 12 months. The type of opioid use disorder you have depends on the number of signs and symptoms you have. The type may be:  Mild. Two or three signs and symptoms.   Moderate. Four or five signs and symptoms.   Severe. Six or more signs and  symptoms. TREATMENT  Treatment is usually provided by mental health professionals with training in substance use disorders.The following options are available:  Detoxification.This is the first step in treatment for withdrawal. It is medically supervised withdrawal with the use of medicines. These medicines lessen withdrawal symptoms. They also raise the chance of becoming opioid free.  Counseling, also known as talk therapy. Talk therapy addresses the reasons you use opioids. It also addresses ways to keep you from using again (relapse). The goals of talk therapy are to avoid relapse by:  Identifying and avoiding triggers for use.  Finding healthy ways to cope with stress.  Learning how to handle cravings.  Support groups. Support groups provide emotional support, advice, and guidance.  A medicine that blocks opioid receptors in your brain. This medicine can reduce opioid cravings that lead to relapse. This medicine also blocks the desired opioid effect when relapse occurs.  Opioids that are taken by mouth in place of the misused opioid (opioid maintenance treatment). These medicines satisfy cravings but are safer than commonly misused opioids. This often is the best option for people who continue to relapse with other treatments. HOME CARE INSTRUCTIONS   Take medicines only as directed by your health care provider.  Check with your health care provider before starting new medicines.  Keep all follow-up visits as directed by your health care provider. SEEK MEDICAL CARE IF:  You are not able to take your medicines as directed.  Your symptoms get worse. SEEK IMMEDIATE MEDICAL CARE IF:  You have serious thoughts about hurting yourself or others.  You may have taken an overdose of opioids. FOR MORE INFORMATION  National Institute on Drug Abuse: http://www.price-smith.com/www.drugabuse.gov  Substance Abuse and Mental Health Services Administration: SkateOasis.com.ptwww.samhsa.gov   This information is not intended to  replace advice given to you by your health care provider. Make sure you discuss any questions you have with your health care provider.   Document Released: 10/02/2007 Document Revised: 12/26/2014 Document Reviewed: 12/18/2013 Elsevier Interactive Patient Education 2016 Elsevier Inc.  Chemical Dependency Chemical dependency is an addiction to drugs or alcohol. It is characterized by the repeated behavior of seeking out and using drugs and alcohol despite harmful consequences to the health and safety of ones self and others.  RISK FACTORS There are certain situations or behaviors that increase a person's risk for chemical dependency. These include:  A family history of chemical dependency.  A history of mental health issues, including depression and anxiety.  A home environment where drugs and alcohol are easily available to you.  Drug or alcohol use at a young age. SYMPTOMS  The following symptoms can indicate chemical dependency:  Inability to limit the use of drugs or alcohol.  Nausea, sweating, shakiness, and anxiety that occurs when alcohol or drugs are not being used.  An increase in amount of drugs or alcohol that is necessary to get drunk or high. People who experience these symptoms can assess their use of drugs and alcohol by asking themselves the following questions:  Have you been told by friends or family that they are worried about your use of alcohol or drugs?  Do friends and family ever tell you  about things you did while drinking alcohol or using drugs that you do not remember?  Do you lie about using alcohol or drugs or about the amounts you use?  Do you have difficulty completing daily tasks unless you use alcohol or drugs?  Is the level of your work or school performance lower because of your drug or alcohol use?  Do you get sick from using drugs or alcohol but keep using anyway?  Do you feel uncomfortable in social situations unless you use alcohol or  drugs?  Do you use drugs or alcohol to help forget problems? An answer of yes to any of these questions may indicate chemical dependency. Professional evaluation is suggested.   This information is not intended to replace advice given to you by your health care provider. Make sure you discuss any questions you have with your health care provider.   Document Released: 11/29/2001 Document Revised: 02/27/2012 Document Reviewed: 02/10/2011 Elsevier Interactive Patient Education 2016 [employer].  Finding Treatment for Addiction WHAT IS ADDICTION? Addiction is a complex disease of the brain. It causes an uncontrollable (compulsive) need for a substance. You can be addicted to alcohol, illegal drugs, or prescription medicines such as painkillers. Addiction can also be a behavior, like gambling or shopping. The need for the drug or activity can become so strong that you think about it all the time. You can also become physically dependent on a substance. Addiction can change the way your brain works. Because of these changes, getting more of whatever you are addicted to becomes the most important thing to you and feels better than other activities or relationships. Addiction can lead to changes in health, behavior, emotions, relationships, and choices that affect you and everyone around you. HOW DO I KNOW IF I NEED TREATMENT FOR ADDICTION? Addiction is a progressive disease. Without treatment, addiction can get worse. Living with addiction puts you at higher risk for injury, poor health, lost employment, loss of money, and even death. You might need treatment for addiction if:  You have tried to stop or cut down, but you cannot.  Your addiction is causing physical health problems.  You find it annoying that your friends and family are concerned about your alcohol or substance use.  You feel guilty about substance abuse or a compulsive behavior.  You have lied or tried to hide your  addiction.  You need a particular substance or activity to start your day or to calm down.  You are getting in trouble at school, work, home, or with the police.  You have done something illegal to support your addiction.  You are running out of money because of your addiction.  You have no time for anything other than your addiction. WHAT TYPES OF TREATMENT ARE AVAILABLE? The treatment program that is right for you will depend on many factors, including the type of addiction you have. Treatment programs can be outpatient or inpatient. In an outpatient program, you live at home and go to work or school, but you also go to a clinic for treatment. With an inpatient program, you live and sleep at the program facility during treatment. After treatment, you might need a plan for support during recovery. Other treatment options include:   Medicine.  Some addictions may be treated with prescription medicines.  You might also need medicine to treat anxiety or depression.  Counseling and behavior therapy. Therapy can help individuals and families behave in healthier ways and relate more effectively.  Support groups. Confidential  group therapy, such as a 12-step program, can help individuals and families during treatment and recovery. No single type of program is right for everyone. Many treatment programs involve a combination of education, counseling, and a 12-step, spiritually-based approach. Some treatment programs are government sponsored. They are geared for patients who do not have private insurance. Treatment programs can vary in many respects, such as:  Cost and types of insurance that are accepted.  Types of on-site medical services that are offered.  Length of stay, setting, and size.  Overall philosophy of treatment. WHAT SHOULD I CONSIDER WHEN SELECTING A TREATMENT PROGRAM? It is important to think about your individual requirements when selecting a treatment program. There are a  number of things to consider, such as:  If the program is certified by the appropriate government agency. Even private programs must be certified and employ certified professionals.  If the program is covered by your insurance. If finances are a concern, the first call you should make is to your insurance company, if you have health insurance. Ask for a list of treatment programs that are in your network, and confirm any copayments and deductibles that you may have to pay.  If you do not have insurance, or if you choose to attend a program that does not accept your insurance, discuss whether a payment plan can be set up.  If treatment is available in languages other than English, if needed.  If the program offers detoxification treatment, if needed.  If 12-step meetings are held at the center or if transport is available for patients to attend meetings at other locations.  If the program is professional, organized, and clean.  If the program meets all of your needs, including physical and cultural needs.  If the facility offers specific treatment for your particular addiction.  If support continues to be offered after you have left the program.  If your treatment plan is continually looked at to make sure you are receiving the right treatment at the right time.  If mental health counseling is part of your treatment.  If medicine is included in treatment, if needed.  If your family is included in your treatment plan and if support is offered to them throughout the treatment process.  How the treatment works to prevent relapse. WHERE ELSE CAN I GET HELP?  Your health care provider. Ask him or her to help you find addiction treatment. These discussions are confidential.  The [employer] Council on Alcoholism and Drug Dependence (NCADD). This group has information about treatment centers and programs for people who have an addiction and for family members.  The telephone number is  1-800-NCA-CALL (850-313-60671-828 773 7140).  The website is https://ncadd.org/about-ncadd/our-affiliates  The Substance Abuse and Mental Health Services Administration [facility](SAMHSA). This group will help you find publicly funded treatment centers, help hotlines, and counseling services near you.  The telephone number is 1-800-662-HELP ((507)631-16211-(606) 368-1610).  The website is www.findtreatment.RockToxic.plsamhsa.gov In countries outside of the [place], look in [employer] directories for contact information for services in your area.   This information is not intended to replace advice given to you by your health care provider. Make sure you discuss any questions you have with your health care provider.   Document Released: 11/03/2005 Document Revised: 08/26/2015 Document Reviewed: 09/23/2014 Elsevier Interactive Patient Education [employer]2016 Elsevier Inc.

## 2016-06-21 NOTE — [facility] Counselor (Signed)
Referral packet submitted to [facility] (fax #[phone])

## 2016-06-21 NOTE — ED Provider Notes (Signed)
[facility] Emergency Department Provider Note  ____________________________________________  Time seen: Approximately 5:09 PM  I have reviewed the triage vital signs and the nursing notes.   HISTORY  Chief Complaint Medical Clearance    HPI Mark Strickland is a 25 y.o. 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  He presents today after calling [facility] for help and being told that he has to come to the [facility] emergency department for medical clearance before they can offer him a bed.  He states that he had been clean for a few months but then relapsed about a week and a half ago and that is negatively affecting his life and he knows that everything mall "falling apart" if he does not quit using again.  He states that he takes pills and shoots heroin.  He last took some pills this morning.  He also smokes tobacco.  He denies any other drug abuse and any other substance ingestion including alcohol.  He denies having any other chronic medical issues.  He was hospitalized at [facility] about 7 months ago after an overdose for what was described as drug-induced encephalopathy.  He denies wanting to kill himself or anyone else and denies hallucinations.  Past Medical History  Diagnosis Date  . Depressed   . Anxiety     Patient Active Problem List   Diagnosis Date Noted  . Drug overdose, intentional (HCC) 12/03/2015  . Encephalopathy, toxic 12/03/2015    History reviewed. No pertinent past surgical history.  Current Outpatient Rx  Name  Route  Sig  Dispense  Refill  . gabapentin (NEURONTIN) 600 MG tablet   Oral   Take 600 mg by mouth 3 (three) times daily.         . sertraline (ZOLOFT) 100 MG tablet   Oral   Take 150 mg by mouth every morning.           Allergies Review of patient's allergies indicates no known allergies.  History reviewed. No pertinent family history.  Social History Social  History  Substance Use Topics  . Smoking status: Current Every Day Smoker -- 1.00 packs/day    Types: Cigarettes  . Smokeless tobacco: None  . Alcohol Use: Yes    Review of Systems Constitutional: No fever/chills Eyes: No visual changes. ENT: No sore throat. Cardiovascular: Denies chest pain. Respiratory: Denies shortness of breath. Gastrointestinal: No abdominal pain.  No nausea, no vomiting.  No diarrhea.  No constipation. Genitourinary: Negative for dysuria. Musculoskeletal: Negative for back pain. Skin: Negative for rash. Neurological: Negative for headaches, focal weakness or numbness. Psych:  Ongoing drug abuse, denies SI/HI  10-point ROS otherwise negative.  ____________________________________________   PHYSICAL EXAM:  VITAL SIGNS: ED Triage Vitals  Enc Vitals Group     BP 06/21/16 1653 126/69 mmHg     Pulse Rate 06/21/16 1653 94     Resp 06/21/16 1653 18     Temp 06/21/16 1653 98.4 F (36.9 C)     Temp Source 06/21/16 1653 Oral     SpO2 06/21/16 1653 95 %     Weight 06/21/16 1653 173 lb (78.472 kg)     Height 06/21/16 1653  (1.803 m)     Head Cir --      Peak Flow --      Pain Score --      Pain Loc --      Pain Edu? --      Excl.  in GC? --     Constitutional: Alert and oriented. Well appearing and in no acute distress. Eyes: Conjunctivae are normal. PERRL. EOMI. Head: Atraumatic. Nose: No congestion/rhinnorhea. Mouth/Throat: Mucous membranes are moist.  Oropharynx non-erythematous. Neck: No stridor.  No meningeal signs.   Cardiovascular: Normal rate, regular rhythm. Good peripheral circulation. Grossly normal heart sounds.   Respiratory: Normal respiratory effort.  No retractions. Lungs CTAB. Gastrointestinal: Soft and nontender. No distention.  Musculoskeletal: No lower extremity tenderness nor edema. No gross deformities of extremities. Neurologic:  Normal speech and language. No gross focal neurologic deficits are appreciated.  Skin:  Skin  is warm, dry and intact. No rash noted. Psychiatric: Mood and affect are normal. Speech and behavior are normal. Capacity to make his own decision.  No SI/HI.  Good insight and judgment.  ____________________________________________   LABS (all labs ordered are listed, but only abnormal results are displayed)  Labs Reviewed  CBC WITH DIFFERENTIAL/PLATELET - Abnormal; Notable for the following:    HCT 39.8 (*)    All other components within normal limits  BASIC METABOLIC PANEL - Abnormal; Notable for the following:    Creatinine, Ser 1.53 (*)    Calcium 8.6 (*)    All other components within normal limits  URINE DRUG SCREEN, QUALITATIVE ([facility] ONLY) - Abnormal; Notable for the following:    Opiate, Ur Screen POSITIVE (*)    Benzodiazepine, Ur Scrn POSITIVE (*)    All other components within normal limits  ACETAMINOPHEN LEVEL - Abnormal; Notable for the following:    Acetaminophen (Tylenol), Serum <10 (*)    All other components within normal limits  ETHANOL  SALICYLATE LEVEL   ____________________________________________  EKG  None ____________________________________________  RADIOLOGY   No results found.  ____________________________________________   PROCEDURES  Procedure(s) performed:   Procedures   ____________________________________________   INITIAL IMPRESSION / ASSESSMENT AND PLAN / ED COURSE  Pertinent labs & imaging results that were available during my care of the patient were reviewed by me and considered in my medical decision making (see chart for details).  Shean with intake assessment called and spoke with [facility].  A representative named Misty StanleyLisa confirmed that although they may have a bed for him, they will not take him unless we perform "medical clearance" and labs that at a minimum include a metabolic panel and a urine drug screen.  In an effort to assist the patient with his recovery I will order these tests although from my perspective he is  medically clear; his vital signs are normal and stable, he is alert and oriented 4, he has no tremor, he has good insight and judgment, and he has the capacity to make his own decisions.  His mother has accompanied him to the emergency department and they both understand the plan.   (Note that documentation was delayed due to multiple ED patients requiring immediate care.)   Lab work is unremarkable. He has a mild creatinine  Elevation of about 1.5 but he is young and muscular and still has a GFR greater than 60.Marland Kitchen. He is capable of tolerating PO fluids without difficulty.  There is no evidence of acute renal failure.  The patient has been discharged to go directly to [facility]. ____________________________________________  FINAL CLINICAL IMPRESSION(S) / ED DIAGNOSES  Final diagnoses:  Uncomplicated opioid dependence (HCC)  Benzodiazepine dependence (HCC)     MEDICATIONS GIVEN DURING THIS VISIT:  Medications - No data to display   NEW OUTPATIENT MEDICATIONS STARTED DURING THIS VISIT:  Discharge Medication  List as of 06/21/2016  8:37 PM        Note:  This document was prepared using Dragon voice recognition software and may include unintentional dictation errors.   Loleta Roseory Dana Dorner, MD 06/22/16 0200

## 2016-09-28 ENCOUNTER — Encounter (HOSPITAL_BASED_OUTPATIENT_CLINIC_OR_DEPARTMENT_OTHER): Payer: Self-pay | Admitting: *Deleted

## 2016-09-28 ENCOUNTER — Emergency Department (HOSPITAL_BASED_OUTPATIENT_CLINIC_OR_DEPARTMENT_OTHER)
Admission: EM | Admit: 2016-09-28 | Discharge: 2016-09-28 | Disposition: A | Discharge status: Home / Self Care | Payer: Self-pay | Attending: Emergency Medicine | Admitting: Emergency Medicine

## 2016-09-28 DIAGNOSIS — Z0000 Encounter for general adult medical examination without abnormal findings: Secondary | ICD-10-CM | POA: Insufficient documentation

## 2016-09-28 DIAGNOSIS — F17210 Nicotine dependence, cigarettes, uncomplicated: Secondary | ICD-10-CM | POA: Insufficient documentation

## 2016-09-28 HISTORY — DX: Opioid dependence, uncomplicated: F11.20

## 2016-09-28 NOTE — Discharge Instructions (Signed)
Your blood pressures here have all been normal. I suspect that your blood pressure with a systolic in the 90s was likely normal as well. Young healthy people can have blood pressures in the 90s without cause or being abnormal as they fluctuate throughout the day. I have attached a copy of all the blood pressures that we have done here and none were anywhere near abnormal.  You also have no symptoms of low blood pressures so I suspect these are all your normal.  I would consider you medically [facility].

## 2016-09-28 NOTE — ED Triage Notes (Addendum)
Pt states sent here from [facility] for low bp , during admission to [facility] . [facility] is holding a bed.

## 2016-09-29 NOTE — ED Provider Notes (Signed)
[facility]-EMERGENCY DEPT Provider Note   CSN: [id number] Arrival date & time: 09/28/16  1300     History   Chief Complaint Chief Complaint  Patient presents with  . Medical Clearance    HPI Mark Strickland is a 25 y.o. male.  Sent from [facility] to have an one low blood pressure for medical clearance. Patient without any cough, shortness breath, chest pain, abdominal pain, nausea, vomiting, sweating, back pain or other new symptoms. No history of low blood pressures. Is reportedly 98 systolic over 63 diastolic. No other symptoms. No associated symptoms. No modifying factors. No history of the same.      Past Medical History:  Diagnosis Date  . Anxiety   . Depressed   . Opiate addiction [facility])     Patient Active Problem List   Diagnosis Date Noted  . Drug overdose, intentional (HCC) 12/03/2015  . Encephalopathy, toxic 12/03/2015    History reviewed. No pertinent surgical history.     Home Medications    Prior to Admission medications   Medication Sig Start Date End Date Taking? Authorizing Provider  gabapentin (NEURONTIN) 600 MG tablet Take 400 mg by mouth 3 (three) times daily.     Historical Provider, MD  sertraline (ZOLOFT) 100 MG tablet Take 200 mg by mouth every morning.     Historical Provider, MD    Family History History reviewed. No pertinent family history.  Social History Social History  Substance Use Topics  . Smoking status: Current Every Day Smoker    Packs/day: 1.00    Types: Cigarettes  . Smokeless tobacco: Not on file  . Alcohol use Yes     Allergies   Review of patient's allergies indicates no known allergies.   Review of Systems Review of Systems  All other systems reviewed and are negative.    Physical Exam Updated Vital Signs BP 114/73 (BP Location: Right Arm)   Pulse 71   Temp 98.2 F (36.8 C) (Oral)   Resp 16   Ht 5\' 11"  (1.803 m)   Wt 162 lb (73.5 kg)   SpO2 96%   BMI 22.59 kg/m   Physical Exam    Constitutional: He is oriented to person, place, and time. He appears well-developed and well-nourished.  HENT:  Head: Normocephalic and atraumatic.  Eyes: Conjunctivae and EOM are normal.  Neck: Normal range of motion.  Cardiovascular: Normal rate.   Pulmonary/Chest: Effort normal. No respiratory distress.  Abdominal: He exhibits no distension.  Musculoskeletal: Normal range of motion. He exhibits no deformity.  Neurological: He is alert and oriented to person, place, and time.  No altered mental status, able to give full seemingly accurate history.  Face is symmetric, EOM's intact, pupils equal and reactive, vision intact, tongue and uvula midline without deviation Upper and Lower extremity motor 5/5, intact pain perception in distal extremities, 2+ reflexes in biceps, patella and achilles tendons. Finger to nose normal, heel to shin normal. Walks without assistance or evident ataxia.   Nursing note and vitals reviewed.    ED Treatments / Results  Labs (all labs ordered are listed, but only abnormal results are displayed) Labs Reviewed - No data to display  EKG  EKG Interpretation None       Radiology No results found.  Procedures Procedures (including critical care time)  Medications Ordered in ED Medications - No data to display   Initial Impression / Assessment and Plan / ED Course  I have reviewed the triage vital signs and the nursing notes.  Pertinent labs & imaging results that were available during my care of the patient were reviewed by me and considered in my medical decision making (see chart for details).  Clinical Course    No e/o low BP here. No indication for further workup. Medically cleared to go back to [facility] for continued treatment.   Final Clinical Impressions(s) / ED Diagnoses   Final diagnoses:  Well adult exam    New Prescriptions Discharge Medication List as of 09/28/2016  3:42 PM       Marily MemosJason Ledia Hanford, MD 09/29/16 1549

## 2016-12-07 ENCOUNTER — Encounter (HOSPITAL_COMMUNITY): Payer: Self-pay | Admitting: Emergency Medicine

## 2016-12-07 ENCOUNTER — Emergency Department (HOSPITAL_COMMUNITY)
Admission: EM | Admit: 2016-12-07 | Discharge: 2016-12-07 | Discharge status: Against Medical Advice | Payer: Self-pay | Attending: Emergency Medicine | Admitting: Emergency Medicine

## 2016-12-07 DIAGNOSIS — F19922 Other psychoactive substance use, unspecified with intoxication with perceptual disturbance: Secondary | ICD-10-CM | POA: Insufficient documentation

## 2016-12-07 DIAGNOSIS — F17210 Nicotine dependence, cigarettes, uncomplicated: Secondary | ICD-10-CM | POA: Insufficient documentation

## 2016-12-07 DIAGNOSIS — Z79899 Other long term (current) drug therapy: Secondary | ICD-10-CM | POA: Insufficient documentation

## 2016-12-07 LAB — CBC WITH DIFFERENTIAL/PLATELET
Basophils Absolute: 0 10*3/uL (ref 0.0–0.1)
Basophils Relative: 0 %
EOS ABS: 0.2 10*3/uL (ref 0.0–0.7)
Eosinophils Relative: 4 %
HEMATOCRIT: 37.2 % — AB (ref 39.0–52.0)
HEMOGLOBIN: 12.1 g/dL — AB (ref 13.0–17.0)
LYMPHS ABS: 1.4 10*3/uL (ref 0.7–4.0)
LYMPHS PCT: 31 %
MCH: 27.2 pg (ref 26.0–34.0)
MCHC: 32.5 g/dL (ref 30.0–36.0)
MCV: 83.6 fL (ref 78.0–100.0)
MONOS PCT: 10 %
Monocytes Absolute: 0.5 10*3/uL (ref 0.1–1.0)
NEUTROS PCT: 55 %
Neutro Abs: 2.6 10*3/uL (ref 1.7–7.7)
Platelets: 395 10*3/uL (ref 150–400)
RBC: 4.45 MIL/uL (ref 4.22–5.81)
RDW: 15.6 % — ABNORMAL HIGH (ref 11.5–15.5)
WBC: 4.7 10*3/uL (ref 4.0–10.5)

## 2016-12-07 LAB — COMPREHENSIVE METABOLIC PANEL
ALK PHOS: 60 U/L (ref 38–126)
ALT: 273 U/L — AB (ref 17–63)
ANION GAP: 8 (ref 5–15)
AST: 125 U/L — ABNORMAL HIGH (ref 15–41)
Albumin: 3.8 g/dL (ref 3.5–5.0)
BILIRUBIN TOTAL: 0.9 mg/dL (ref 0.3–1.2)
BUN: 12 mg/dL (ref 6–20)
CALCIUM: 9 mg/dL (ref 8.9–10.3)
CO2: 29 mmol/L (ref 22–32)
CREATININE: 1.47 mg/dL — AB (ref 0.61–1.24)
Chloride: 103 mmol/L (ref 101–111)
Glucose, Bld: 100 mg/dL — ABNORMAL HIGH (ref 65–99)
Potassium: 4.7 mmol/L (ref 3.5–5.1)
Sodium: 140 mmol/L (ref 135–145)
TOTAL PROTEIN: 7 g/dL (ref 6.5–8.1)

## 2016-12-07 LAB — ACETAMINOPHEN LEVEL

## 2016-12-07 LAB — SALICYLATE LEVEL

## 2016-12-07 MED ORDER — SODIUM CHLORIDE 0.9 % IV BOLUS (SEPSIS): 1000.0000 mL | Freq: Once | INTRAVENOUS | Status: DC

## 2016-12-07 NOTE — ED Notes (Signed)
Bed: [id number] Expected date:  Expected time:  Means of arrival:  Comments: EMS- 25yo M, opiate OD

## 2016-12-07 NOTE — ED Notes (Signed)
Patient stated he wanted to leave against medical advise.  Stated he did not want any treatment or further testing.  Patient signed out AMA.

## 2016-12-07 NOTE — ED Provider Notes (Addendum)
[facility]-EMERGENCY DEPT Provider Note   CSN: [id number] Arrival date & time: 12/07/16  1719     History   Chief Complaint Chief Complaint  Patient presents with  . Altered Mental Status    HPI Hanley Benicolas Carolan is a 25 y.o. male.  25 yo M with intoxication.  Picked up by police for DUI.  Patient denies etoh or illegal drug use.  Denies narcotic use.  Denies fevers, chills.  Denies heroin use.     The history is provided by the patient.  Altered Mental Status   This is a new problem. The current episode started 3 to 5 hours ago. The problem has not changed since onset.Pertinent negatives include no confusion. Risk factors include illicit drug use.  Illness  Pertinent negatives include no chest pain, no abdominal pain, no headaches and no shortness of breath. Nothing aggravates the symptoms. Nothing relieves the symptoms. He has tried nothing for the symptoms.    Past Medical History:  Diagnosis Date  . Anxiety   . Depressed   . Opiate addiction [facility](HCC)     Patient Active Problem List   Diagnosis Date Noted  . Drug overdose, intentional (HCC) 12/03/2015  . Encephalopathy, toxic 12/03/2015    History reviewed. No pertinent surgical history.     Home Medications    Prior to Admission medications   Medication Sig Start Date End Date Taking? Authorizing Provider  ClonazePAM (KLONOPIN PO) Take by mouth.   Yes Historical Provider, MD  gabapentin (NEURONTIN) 600 MG tablet Take 400 mg by mouth 3 (three) times daily.     Historical Provider, MD  sertraline (ZOLOFT) 100 MG tablet Take 200 mg by mouth every morning.     Historical Provider, MD    Family History No family history on file.  Social History Social History  Substance Use Topics  . Smoking status: Current Every Day Smoker    Packs/day: 1.00    Types: Cigarettes  . Smokeless tobacco: Never Used  . Alcohol use Yes     Allergies   Patient has no known allergies.   Review of Systems Review of Systems    Constitutional: Negative for chills and fever.  HENT: Negative for congestion and facial swelling.   Eyes: Negative for discharge and visual disturbance.  Respiratory: Negative for shortness of breath.   Cardiovascular: Negative for chest pain and palpitations.  Gastrointestinal: Negative for abdominal pain, diarrhea and vomiting.  Musculoskeletal: Negative for arthralgias and myalgias.  Skin: Negative for color change and rash.  Neurological: Negative for tremors, syncope and headaches.  Psychiatric/Behavioral: Negative for confusion and dysphoric mood.     Physical Exam Updated Vital Signs BP 129/79 (BP Location: Right Arm)   Pulse 92   Temp 98.7 F (37.1 C)   Resp 13   Wt 165 lb (74.8 kg)   SpO2 (!) 89%   BMI 23.01 kg/m   Physical Exam  Constitutional: He is oriented to person, place, and time. He appears well-developed and well-nourished.  Appears intoxicated  HENT:  Head: Normocephalic and atraumatic.  Pin point pupils  Eyes: EOM are normal. Pupils are equal, round, and reactive to light.  Neck: Normal range of motion. Neck supple. No JVD present.  Cardiovascular: Normal rate and regular rhythm.  Exam reveals no gallop and no friction rub.   No murmur heard. Pulmonary/Chest: No respiratory distress. He has no wheezes.  Abdominal: He exhibits no distension and no mass. There is no tenderness. There is no rebound and no guarding.  Musculoskeletal: Normal range of motion.  Neurological: He is alert and oriented to person, place, and time.  Mild sleepiness  Skin: No rash noted. No pallor.  Psychiatric: He has a normal mood and affect. His behavior is normal.  Nursing note and vitals reviewed.    ED Treatments / Results  Labs (all labs ordered are listed, but only abnormal results are displayed) Labs Reviewed  CBC WITH DIFFERENTIAL/PLATELET - Abnormal; Notable for the following:       Result Value   Hemoglobin 12.1 (*)    HCT 37.2 (*)    RDW 15.6 (*)    All  other components within normal limits  COMPREHENSIVE METABOLIC PANEL - Abnormal; Notable for the following:    Glucose, Bld 100 (*)    Creatinine, Ser 1.47 (*)    AST 125 (*)    ALT 273 (*)    All other components within normal limits  ACETAMINOPHEN LEVEL - Abnormal; Notable for the following:    Acetaminophen (Tylenol), Serum <10 (*)    All other components within normal limits  SALICYLATE LEVEL  RAPID URINE DRUG SCREEN, HOSP PERFORMED    EKG  EKG Interpretation  Date/Time:  Wednesday December 07 2016 17:32:31 EST Ventricular Rate:  105 PR Interval:    QRS Duration: 88 QT Interval:  317 QTC Calculation: 419 R Axis:   49 Text Interpretation:  Sinus tachycardia No significant change since last tracing Confirmed by Jahniya Duzan MD, Reuel BoomANIEL ([id number]) on 12/07/2016 6:16:16 PM       Radiology No results found.  Procedures Procedures (including critical care time)  Medications Ordered in ED Medications  sodium chloride 0.9 % bolus 1,000 mL (1,000 mLs Intravenous Refused 12/07/16 1905)     Initial Impression / Assessment and Plan / ED Course  I have reviewed the triage vital signs and the nursing notes.  Pertinent labs & imaging results that were available during my care of the patient were reviewed by me and considered in my medical decision making (see chart for details).  Clinical Course     25 yo M with intoxication.  Labs drawn as has hx of opiate overdose with admission and patient refusing that he took anything new.  Labs unremarkable.  Patient left [facility] once the police left his side.     Medications given during this visit Medications  sodium chloride 0.9 % bolus 1,000 mL (1,000 mLs Intravenous Refused 12/07/16 1905)     The patient appears reasonably screen and/or stabilized for discharge and I doubt any other medical condition or other [facility] requiring further screening, evaluation, or treatment in the ED at this time prior to discharge.    Final Clinical  Impressions(s) / ED Diagnoses   Final diagnoses:  Drug intoxication with perceptual disturbance [facility](HCC)    New Prescriptions Discharge Medication List as of 12/07/2016  7:08 PM       Melene Planan Leler Brion, DO 12/07/16 2126    Melene Planan Khamryn Calderone, DO 12/07/16 2127

## 2016-12-07 NOTE — ED Notes (Signed)
OFFICER ASKED TO HOLD OFF ON BLOOD IN CASE WE NEED TO USE HIS BLOOD KIT.  

## 2016-12-07 NOTE — ED Triage Notes (Signed)
Pt brought in by [employer] after being pulled for driving under apparent influence.  Pt states he did not take anything other than what he is prescribed. States he has taken 1.5 mg of klonopin today since he woke up at noon.  [employer] at bedside.  Pt very drowsy.  Saturating at 88% and placed on [place] 2 L.  Pt's pupils pinpoint and non reactive.

## 2016-12-07 NOTE — ED Notes (Signed)
Blood drawn for police purposes as per, Emergency planning/management officerolice Officer [employer]

## 2017-01-14 ENCOUNTER — Emergency Department (HOSPITAL_COMMUNITY): Payer: Self-pay

## 2017-01-14 ENCOUNTER — Emergency Department (HOSPITAL_COMMUNITY)
Admission: EM | Admit: 2017-01-14 | Discharge: 2017-01-14 | Disposition: A | Discharge status: Home / Self Care | Payer: Self-pay | Attending: Emergency Medicine | Admitting: Emergency Medicine

## 2017-01-14 ENCOUNTER — Encounter (HOSPITAL_COMMUNITY): Payer: Self-pay | Admitting: Emergency Medicine

## 2017-01-14 DIAGNOSIS — F17210 Nicotine dependence, cigarettes, uncomplicated: Secondary | ICD-10-CM | POA: Insufficient documentation

## 2017-01-14 DIAGNOSIS — J0110 Acute frontal sinusitis, unspecified: Secondary | ICD-10-CM

## 2017-01-14 DIAGNOSIS — Z79899 Other long term (current) drug therapy: Secondary | ICD-10-CM | POA: Insufficient documentation

## 2017-01-14 MED ORDER — METOCLOPRAMIDE HCL 5 MG/ML IJ SOLN
10.0000 mg | Freq: Once | INTRAMUSCULAR | Status: AC
  Administered 2017-01-14: 10 mg via INTRAVENOUS
  Filled 2017-01-14: qty 2

## 2017-01-14 MED ORDER — SODIUM CHLORIDE 0.9 % IV BOLUS (SEPSIS)
1000.0000 mL | Freq: Once | INTRAVENOUS | Status: AC
  Administered 2017-01-14: 1000 mL via INTRAVENOUS

## 2017-01-14 MED ORDER — DIPHENHYDRAMINE HCL 50 MG/ML IJ SOLN
12.5000 mg | Freq: Once | INTRAMUSCULAR | Status: AC
  Administered 2017-01-14: 12.5 mg via INTRAVENOUS
  Filled 2017-01-14: qty 1

## 2017-01-14 MED ORDER — KETOROLAC TROMETHAMINE 30 MG/ML IJ SOLN
30.0000 mg | Freq: Once | INTRAMUSCULAR | Status: AC
  Administered 2017-01-14: 30 mg via INTRAVENOUS
  Filled 2017-01-14: qty 1

## 2017-01-14 MED ORDER — HALOPERIDOL LACTATE 5 MG/ML IJ SOLN
5.0000 mg | Freq: Once | INTRAMUSCULAR | Status: DC
  Filled 2017-01-14: qty 1

## 2017-01-14 MED ORDER — DEXAMETHASONE SODIUM PHOSPHATE 10 MG/ML IJ SOLN
10.0000 mg | Freq: Once | INTRAMUSCULAR | Status: AC
  Administered 2017-01-14: 10 mg via INTRAVENOUS
  Filled 2017-01-14: qty 1

## 2017-01-14 NOTE — ED Triage Notes (Signed)
Pt began having headache 4 days ago.  No injury.  Rates pain 10/10 with rare nausea, no vomiting.  Denies light sensitivity.

## 2017-01-14 NOTE — ED Provider Notes (Signed)
[facility]-EMERGENCY DEPT Provider Note   CSN: [id number] Arrival date & time: 01/14/17  1047     History   Chief Complaint Chief Complaint  Patient presents with  . Headache    HPI Mark Strickland is a 26 y.o. male.   Headache   Associated symptoms include nausea. Pertinent negatives include no fever, no shortness of breath and no vomiting.    Mark Strickland is a 26 y.o. male  who presents to the Emergency Department complaining of progressively worsening, throbbing right-sided headache x 4 days. Worse when bending over. No alleviating factors noted. Patient has taken ibuprofen and Sudafed for 2 days with no relief. He fell and tried these powder which did not help. He then tried extra strength Tylenol as well as Excedrin Migraine. He took a few Percocet from a prior procedure which also provided little relief. He states that he has a history of migraines, however has not had one in several years and this does not feel similar to his previous. He endorses associated nausea, but has had no episodes of emesis. No visual changes or fevers. No neck pain.  Past Medical History:  Diagnosis Date  . Anxiety   . Depressed   . Opiate addiction [facility](HCC)     Patient Active Problem List   Diagnosis Date Noted  . Drug overdose, intentional (HCC) 12/03/2015  . Encephalopathy, toxic 12/03/2015    History reviewed. No pertinent surgical history.     Home Medications    Prior to Admission medications   Medication Sig Start Date End Date Taking? Authorizing Provider  ClonazePAM (KLONOPIN PO) Take by mouth.    Historical Provider, MD  gabapentin (NEURONTIN) 600 MG tablet Take 400 mg by mouth 3 (three) times daily.     Historical Provider, MD  sertraline (ZOLOFT) 100 MG tablet Take 200 mg by mouth every morning.     Historical Provider, MD    Family History History reviewed. No pertinent family history.  Social History Social History  Substance Use Topics  . Smoking status: Current Every  Day Smoker    Packs/day: 1.00    Types: Cigarettes  . Smokeless tobacco: Never Used  . Alcohol use Yes     Allergies   Patient has no known allergies.   Review of Systems Review of Systems  Constitutional: Negative for chills and fever.  HENT: Positive for sinus pressure. Negative for sore throat.   Eyes: Negative for visual disturbance.  Respiratory: Negative for cough and shortness of breath.   Cardiovascular: Negative.   Gastrointestinal: Positive for nausea. Negative for abdominal pain, blood in stool, diarrhea and vomiting.  Musculoskeletal: Negative for back pain and neck pain.  Skin: Negative for rash.  Neurological: Positive for headaches. Negative for dizziness and weakness.  Hematological: Does not bruise/bleed easily.     Physical Exam Updated Vital Signs BP 116/81 (BP Location: Right Arm)   Pulse 110   Temp 97.7 F (36.5 C) (Oral)   Resp 18   Ht 5\' 11"  (1.803 m)   Wt 79.4 kg   SpO2 98%   BMI 24.41 kg/m   Physical Exam  Constitutional: He is oriented to person, place, and time. He appears well-developed and well-nourished. No distress.  HENT:  Head: Normocephalic and atraumatic.  Mouth/Throat: Oropharynx is clear and moist.  Eyes: Conjunctivae and EOM are normal. Pupils are equal, round, and reactive to light. No scleral icterus.  No nystagmus   Neck: Normal range of motion. Neck supple.  Full active and passive  ROM without pain.  No midline or paraspinal tenderness. No nuchal rigidity or meningeal signs.  Cardiovascular: Normal rate, regular rhythm, normal heart sounds and intact distal pulses.   Pulmonary/Chest: Effort normal and breath sounds normal. No respiratory distress. He has no wheezes. He has no rales.  Abdominal: Soft. Bowel sounds are normal. There is no tenderness. There is no rebound and no guarding.  Musculoskeletal: Normal range of motion.  Lymphadenopathy:    He has no cervical adenopathy.  Neurological: He is alert and oriented to  person, place, and time. He has normal reflexes. No cranial nerve deficit. Coordination normal.  Mental Status: Alert, oriented, and thought content is appropriate. Speech is fluent without evidence of aphasia. Able to follow two-step commands without difficulty.  Cranial Nerves:  II - Peripheral visual fields grossly normal, pupils equal, round, reactive to light III, IV, VI - Bilateral EOM intact, no ptosis V - Facial light touch sensation intact and equal VII - Facial symmetry: smile, raised eyebrows ; Eyelids kept closed against resistance VIII - Hearing grossly normal bilaterally  IX, X - Uvula midline XI - Bilateral shoulder shrug equal and strong XII - Tongue extension midline Motor:  5/5 muscle strength of upper and lower extremities bilaterally including strong and equal grip strength and plantar/dorsiflexion.  Sensory:  Light touch sensory intact.   Skin: Skin is warm and dry. No rash noted. He is not diaphoretic.  Psychiatric: He has a normal mood and affect. His behavior is normal. Judgment and thought content normal.  Nursing note and vitals reviewed.    ED Treatments / Results  Labs (all labs ordered are listed, but only abnormal results are displayed) Labs Reviewed - No data to display  EKG  EKG Interpretation None       Radiology No results found.  Procedures Procedures (including critical care time)  Medications Ordered in ED Medications - No data to display   Initial Impression / Assessment and Plan / ED Course  I have reviewed the triage vital signs and the nursing notes.  Pertinent labs & imaging results that were available during my care of the patient were reviewed by me and considered in my medical decision making (see chart for details).    Mark Strickland is a 26 y.o. male who presents to ED for headache. No focal neuro deficits on exam. Patient also complaining of sinus pressure / congestion which is likely contributory. Migraine cocktail  given with little relief. Offered Haldol for continued relief, but patient declined. CT head reassuring. Continue sudafed. Follow up with PCP encouraged. All questions answered.    Final Clinical Impressions(s) / ED Diagnoses   Final diagnoses:  None    New Prescriptions New Prescriptions   No medications on file     [facility] Ward, PA-C 01/14/17 1606    Lyndal Pulley, MD 01/16/17 [phone]

## 2017-01-14 NOTE — ED Triage Notes (Signed)
Pt did not want to to wait for discharge instructions, Asher MuirJamie PA verbally discussed discharge with pt, he verbalized understanding, left prior to signing and receiving written instructions.

## 2017-01-15 ENCOUNTER — Emergency Department (HOSPITAL_COMMUNITY)
Admission: EM | Admit: 2017-01-15 | Discharge: 2017-01-15 | Disposition: A | Discharge status: Home / Self Care | Payer: No Typology Code available for payment source | Attending: Emergency Medicine | Admitting: Emergency Medicine

## 2017-01-15 ENCOUNTER — Encounter (HOSPITAL_COMMUNITY): Payer: Self-pay | Admitting: Nurse Practitioner

## 2017-01-15 ENCOUNTER — Emergency Department (HOSPITAL_COMMUNITY): Payer: No Typology Code available for payment source

## 2017-01-15 DIAGNOSIS — Y939 Activity, unspecified: Secondary | ICD-10-CM | POA: Diagnosis not present

## 2017-01-15 DIAGNOSIS — Y999 Unspecified external cause status: Secondary | ICD-10-CM | POA: Insufficient documentation

## 2017-01-15 DIAGNOSIS — Y92410 Unspecified street and highway as the place of occurrence of the external cause: Secondary | ICD-10-CM | POA: Insufficient documentation

## 2017-01-15 DIAGNOSIS — S39012A Strain of muscle, fascia and tendon of lower back, initial encounter: Secondary | ICD-10-CM | POA: Insufficient documentation

## 2017-01-15 DIAGNOSIS — F17210 Nicotine dependence, cigarettes, uncomplicated: Secondary | ICD-10-CM | POA: Insufficient documentation

## 2017-01-15 DIAGNOSIS — S3992XA Unspecified injury of lower back, initial encounter: Secondary | ICD-10-CM | POA: Diagnosis present

## 2017-01-15 DIAGNOSIS — S161XXA Strain of muscle, fascia and tendon at neck level, initial encounter: Secondary | ICD-10-CM | POA: Diagnosis not present

## 2017-01-15 MED ORDER — METHOCARBAMOL 500 MG PO TABS: 500.0000 mg | ORAL_TABLET | Freq: Two times a day (BID) | ORAL | 0 refills | Status: DC

## 2017-01-15 MED ORDER — IBUPROFEN 800 MG PO TABS: 800.0000 mg | ORAL_TABLET | Freq: Once | ORAL | Status: DC
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
## 2017-01-15 NOTE — ED Notes (Addendum)
Patient is upset that MD will not give him a narcotic. Patient states he does not want Ibuprofen.
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
## 2017-01-15 NOTE — Discharge Instructions (Signed)
1. Medications: robaxin for muscle spasm, usual home medications 2. Treatment: rest, drink plenty of fluids, gentle stretching as discussed, alternate ice and heat 3. Follow Up: Please followup with your primary doctor in 3 days for discussion of your diagnoses and further evaluation after today's visit; if you do not have a primary care doctor use the resource guide provided to find one;  Return to the ER for worsening back pain, difficulty walking, loss of bowel or bladder control or other concerning symptoms

## 2017-01-20 ENCOUNTER — Emergency Department (HOSPITAL_COMMUNITY): Payer: Self-pay

## 2017-01-20 ENCOUNTER — Encounter (HOSPITAL_COMMUNITY): Payer: Self-pay | Admitting: *Deleted

## 2017-01-20 ENCOUNTER — Emergency Department (HOSPITAL_COMMUNITY)
Admission: EM | Admit: 2017-01-20 | Discharge: 2017-01-20 | Disposition: A | Discharge status: Home / Self Care | Payer: Self-pay | Attending: Emergency Medicine | Admitting: Emergency Medicine

## 2017-01-20 DIAGNOSIS — J0100 Acute maxillary sinusitis, unspecified: Secondary | ICD-10-CM | POA: Insufficient documentation

## 2017-01-20 DIAGNOSIS — F17210 Nicotine dependence, cigarettes, uncomplicated: Secondary | ICD-10-CM | POA: Insufficient documentation

## 2017-01-20 DIAGNOSIS — R519 Headache, unspecified: Secondary | ICD-10-CM

## 2017-01-20 DIAGNOSIS — R51 Headache: Secondary | ICD-10-CM | POA: Insufficient documentation

## 2017-01-20 MED ORDER — DEXAMETHASONE SODIUM PHOSPHATE 10 MG/ML IJ SOLN
10.0000 mg | Freq: Once | INTRAMUSCULAR | Status: AC
  Administered 2017-01-20: 10 mg via INTRAMUSCULAR
  Filled 2017-01-20: qty 1

## 2017-01-20 MED ORDER — AMOXICILLIN-POT CLAVULANATE 875-125 MG PO TABS: 1.0000 | ORAL_TABLET | Freq: Two times a day (BID) | ORAL | 0 refills | Status: DC

## 2017-01-20 MED ORDER — BUTALBITAL-APAP-CAFFEINE 50-325-40 MG PO TABS: 1.0000 | ORAL_TABLET | Freq: Four times a day (QID) | ORAL | 0 refills | Status: DC | PRN

## 2017-01-20 MED ORDER — BUTALBITAL-APAP-CAFFEINE 50-325-40 MG PO TABS
1.0000 | ORAL_TABLET | Freq: Once | ORAL | Status: AC
  Administered 2017-01-20: 1 via ORAL
  Filled 2017-01-20: qty 1

## 2017-01-20 NOTE — ED Notes (Signed)
Joselyn Glassmanyler, [place] made aware of patient's Neuro findings.

## 2017-01-20 NOTE — Discharge Instructions (Signed)
Please read and follow all provided instructions.  Your diagnoses today include:  1. Nonintractable headache, unspecified chronicity pattern, unspecified headache type   2. Acute non-recurrent maxillary sinusitis     Tests performed today include: CT of your head which was normal and did not show any serious cause of your headache Vital signs. See below for your results today.   Medications:  In the Emergency Department you received: Reglan - antinausea/headache medication Benadryl - antihistamine to counteract potential side effects of reglan Toradol - NSAID medication similar to ibuprofen  Take any prescribed medications only as directed.  Additional information:  Follow any educational materials contained in this packet.  You are having a headache. No specific cause was found today for your headache. It may have been a migraine or other cause of headache. Stress, anxiety, fatigue, and depression are common triggers for headaches.   Your headache today does not appear to be life-threatening or require hospitalization, but often the exact cause of headaches is not determined in the emergency department. Therefore, follow-up with your doctor is very important to find out what may have caused your headache and whether or not you need any further diagnostic testing or treatment.   Sometimes headaches can appear benign (not harmful), but then more serious symptoms can develop which should prompt an immediate re-evaluation by your doctor or the emergency department.  BE VERY CAREFUL not to take multiple medicines containing Tylenol (also called acetaminophen). Doing so can lead to an overdose which can damage your liver and cause liver failure and possibly death.   Follow-up instructions: Please follow-up with your primary care provider in the next 3 days for further evaluation of your symptoms.   Return instructions:  Please return to the Emergency Department if you experience  worsening symptoms. Return if the medications do not resolve your headache, if it recurs, or if you have multiple episodes of vomiting or cannot keep down fluids. Return if you have a change from the usual headache. RETURN IMMEDIATELY IF you: Develop a sudden, severe headache Develop confusion or become poorly responsive or faint Develop a fever above 100.43F or problem breathing Have a change in speech, vision, swallowing, or understanding Develop new weakness, numbness, tingling, incoordination in your arms or legs Have a seizure Please return if you have any other emergent concerns.  Additional Information:  Your vital signs today were: BP 131/91 (BP Location: Right Arm)    Pulse 96    Temp 99.1 F (37.3 C) (Oral)    Resp 18    SpO2 98%  If your blood pressure (BP) was elevated above 135/85 this visit, please have this repeated by your doctor within one month. --------------

## 2017-01-20 NOTE — ED Notes (Signed)
Pt went to CT
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
## 2017-07-14 ENCOUNTER — Emergency Department (HOSPITAL_COMMUNITY)
Admission: EM | Admit: 2017-07-14 | Discharge: 2017-07-14 | Disposition: A | Discharge status: Home / Self Care | Payer: Self-pay | Attending: Emergency Medicine | Admitting: Emergency Medicine

## 2017-07-14 ENCOUNTER — Encounter (HOSPITAL_COMMUNITY): Payer: Self-pay | Admitting: Nurse Practitioner

## 2017-07-14 DIAGNOSIS — Y939 Activity, unspecified: Secondary | ICD-10-CM | POA: Insufficient documentation

## 2017-07-14 DIAGNOSIS — M25552 Pain in left hip: Secondary | ICD-10-CM | POA: Insufficient documentation

## 2017-07-14 DIAGNOSIS — Y999 Unspecified external cause status: Secondary | ICD-10-CM | POA: Insufficient documentation

## 2017-07-14 DIAGNOSIS — M25562 Pain in left knee: Secondary | ICD-10-CM | POA: Insufficient documentation

## 2017-07-14 DIAGNOSIS — Y92410 Unspecified street and highway as the place of occurrence of the external cause: Secondary | ICD-10-CM | POA: Insufficient documentation

## 2017-07-14 DIAGNOSIS — S91101A Unspecified open wound of right great toe without damage to nail, initial encounter: Secondary | ICD-10-CM | POA: Insufficient documentation

## 2017-07-14 DIAGNOSIS — F17210 Nicotine dependence, cigarettes, uncomplicated: Secondary | ICD-10-CM | POA: Insufficient documentation

## 2017-07-14 DIAGNOSIS — Z79899 Other long term (current) drug therapy: Secondary | ICD-10-CM | POA: Insufficient documentation

## 2017-07-14 HISTORY — DX: Unspecified convulsions: R56.9

## 2017-07-14 MED ORDER — METHOCARBAMOL 500 MG PO TABS: 500.0000 mg | ORAL_TABLET | Freq: Every evening | ORAL | 0 refills | Status: DC | PRN
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
## 2017-07-14 NOTE — Discharge Instructions (Signed)
Take Ibuprofen three times daily for the next week. Take this medicine with food. °Take muscle relaxer at bedtime to help you sleep. This medicine makes you drowsy so do not take before driving or work °Use a heating pad for sore muscles - use for 20 minutes several times a day °Return for worsening symptoms ° °
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
## 2017-08-06 ENCOUNTER — Encounter (HOSPITAL_COMMUNITY): Payer: Self-pay | Admitting: Nurse Practitioner

## 2017-08-06 ENCOUNTER — Emergency Department (HOSPITAL_COMMUNITY)
Admission: EM | Admit: 2017-08-06 | Discharge: 2017-08-06 | Disposition: A | Discharge status: Home / Self Care | Payer: Self-pay | Attending: Emergency Medicine | Admitting: Emergency Medicine

## 2017-08-06 DIAGNOSIS — F17210 Nicotine dependence, cigarettes, uncomplicated: Secondary | ICD-10-CM | POA: Insufficient documentation

## 2017-08-06 DIAGNOSIS — R4182 Altered mental status, unspecified: Secondary | ICD-10-CM | POA: Insufficient documentation

## 2017-08-06 DIAGNOSIS — Z79899 Other long term (current) drug therapy: Secondary | ICD-10-CM | POA: Insufficient documentation

## 2017-08-06 DIAGNOSIS — F1110 Opioid abuse, uncomplicated: Secondary | ICD-10-CM | POA: Insufficient documentation

## 2017-08-06 LAB — RAPID URINE DRUG SCREEN, HOSP PERFORMED
Amphetamines: NOT DETECTED
BENZODIAZEPINES: NOT DETECTED
Barbiturates: NOT DETECTED
Cocaine: NOT DETECTED
Opiates: POSITIVE — AB
Tetrahydrocannabinol: NOT DETECTED

## 2017-08-06 LAB — CBC WITH DIFFERENTIAL/PLATELET
BASOS ABS: 0 10*3/uL (ref 0.0–0.1)
BASOS PCT: 0 %
EOS PCT: 0 %
Eosinophils Absolute: 0 10*3/uL (ref 0.0–0.7)
HCT: 38.9 % — ABNORMAL LOW (ref 39.0–52.0)
Hemoglobin: 13.1 g/dL (ref 13.0–17.0)
Lymphocytes Relative: 25 %
Lymphs Abs: 1.9 10*3/uL (ref 0.7–4.0)
MCH: 29.7 pg (ref 26.0–34.0)
MCHC: 33.7 g/dL (ref 30.0–36.0)
MCV: 88.2 fL (ref 78.0–100.0)
MONO ABS: 0.7 10*3/uL (ref 0.1–1.0)
Monocytes Relative: 9 %
Neutro Abs: 4.9 10*3/uL (ref 1.7–7.7)
Neutrophils Relative %: 66 %
PLATELETS: 296 10*3/uL (ref 150–400)
RBC: 4.41 MIL/uL (ref 4.22–5.81)
RDW: 12.7 % (ref 11.5–15.5)
WBC: 7.5 10*3/uL (ref 4.0–10.5)

## 2017-08-06 LAB — BASIC METABOLIC PANEL
ANION GAP: 7 (ref 5–15)
BUN: 15 mg/dL (ref 6–20)
CALCIUM: 9.2 mg/dL (ref 8.9–10.3)
CO2: 28 mmol/L (ref 22–32)
Chloride: 106 mmol/L (ref 101–111)
Creatinine, Ser: 1.38 mg/dL — ABNORMAL HIGH (ref 0.61–1.24)
GLUCOSE: 103 mg/dL — AB (ref 65–99)
Potassium: 4.1 mmol/L (ref 3.5–5.1)
SODIUM: 141 mmol/L (ref 135–145)

## 2017-08-06 LAB — URINALYSIS, ROUTINE W REFLEX MICROSCOPIC
Bilirubin Urine: NEGATIVE
Glucose, UA: NEGATIVE mg/dL
HGB URINE DIPSTICK: NEGATIVE
Ketones, ur: NEGATIVE mg/dL
Leukocytes, UA: NEGATIVE
Nitrite: NEGATIVE
Protein, ur: NEGATIVE mg/dL
Specific Gravity, Urine: 1.016 (ref 1.005–1.030)
pH: 5 (ref 5.0–8.0)

## 2017-08-06 LAB — I-STAT TROPONIN, ED: TROPONIN I, POC: 0 ng/mL (ref 0.00–0.08)

## 2017-08-06 MED ORDER — SODIUM CHLORIDE 0.9 % IV BOLUS (SEPSIS)
1000.0000 mL | Freq: Once | INTRAVENOUS | Status: AC
  Administered 2017-08-06: 1000 mL via INTRAVENOUS

## 2017-08-06 MED ORDER — NALOXONE HCL 0.4 MG/ML IJ SOLN
0.4000 mg | Freq: Once | INTRAMUSCULAR | Status: AC
Start: 2017-08-06 — End: 2017-08-06
  Administered 2017-08-06: 0.4 mg via INTRAVENOUS
  Filled 2017-08-06: qty 1

## 2017-08-06 MED ORDER — NALOXONE HCL 0.4 MG/ML IJ SOLN
0.4000 mg | Freq: Once | INTRAMUSCULAR | Status: AC
  Administered 2017-08-06: 0.4 mg via INTRAVENOUS
  Filled 2017-08-06: qty 1

## 2017-08-06 MED ORDER — ONDANSETRON HCL 4 MG/2ML IJ SOLN
4.0000 mg | Freq: Once | INTRAMUSCULAR | Status: AC
  Administered 2017-08-06: 4 mg via INTRAVENOUS
  Filled 2017-08-06: qty 2

## 2017-08-06 NOTE — ED Notes (Addendum)
Pt provided with another tray per PA and provided with urinal

## 2017-08-06 NOTE — ED Provider Notes (Signed)
[facility]-EMERGENCY DEPT Provider Note   CSN: [id number] Arrival date & time: 08/06/17  0009     History   Chief Complaint Chief Complaint  Patient presents with  . Drug Overdose    LEVEL 5 CAVEAT SECONDARY TO AMS  HPI Mark Strickland is a 26 y.o. male.  26 year old male with history of opiate addiction, seizures during childhood, depression, and anxiety presents to the emergency department for alleged overdose. He was brought in by a friend to states that the patient began acting bizarre this afternoon. Patient with known history of IV heroin abuse, but friend is not convinced that the patient use this today. He notes similar types of symptoms with a Phenibut overdose in December 2017. Patient unable to contribute to history. He will wake to sternal rubbing and loud voice.   The history is provided by the patient. No language interpreter was used.  Drug Overdose     Past Medical History:  Diagnosis Date  . Anxiety   . Depressed   . Opiate addiction (HCC)   . Seizures (HCC)    childhood    Patient Active Problem List   Diagnosis Date Noted  . Drug overdose, intentional (HCC) 12/03/2015  . Encephalopathy, toxic 12/03/2015    History reviewed. No pertinent surgical history.     Home Medications    Prior to Admission medications   Medication Sig Start Date End Date Taking? Authorizing Provider  acetaminophen (TYLENOL) 500 MG tablet Take 500 mg by mouth every 6 (six) hours as needed for moderate pain.    [provider]  amoxicillin-clavulanate (AUGMENTIN) 875-125 MG tablet Take 1 tablet by mouth every 12 (twelve) hours. 01/20/17   Audry Pili, PA-C  butalbital-acetaminophen-caffeine (FIORICET, ESGIC) [phone] MG tablet Take 1 tablet by mouth every 6 (six) hours as needed for headache. 01/20/17 01/20/18  Audry Pili, PA-C  ibuprofen (ADVIL,MOTRIN) 200 MG tablet Take 400-600 mg by mouth every 6 (six) hours as needed for moderate pain.    [provider]    methocarbamol (ROBAXIN) 500 MG tablet Take 1 tablet (500 mg total) by mouth at bedtime and may repeat dose one time if needed. 07/14/17   Bethel Born, PA-C  sertraline (ZOLOFT) 100 MG tablet Take 200 mg by mouth every morning.     [provider]    Family History History reviewed. No pertinent family history.  Social History Social History  Substance Use Topics  . Smoking status: Current Every Day Smoker    Packs/day: 1.00    Types: Cigarettes  . Smokeless tobacco: Never Used  . Alcohol use Yes     Allergies   Patient has no known allergies.   Review of Systems Review of Systems  Unable to perform ROS: Mental status change     Physical Exam Updated Vital Signs BP (!) 99/53   Pulse 60   Resp 13   SpO2 94%   Physical Exam  Constitutional: He appears well-developed and well-nourished. No distress.  Patient somnolent, nontoxic appearing  HENT:  Head: Normocephalic and atraumatic.  Eyes: Conjunctivae and EOM are normal. No scleral icterus.  Neck: Normal range of motion.  Cardiovascular: Normal rate, regular rhythm and intact distal pulses.   Pulmonary/Chest: No respiratory distress. He has no wheezes.  Bradypnea without hypoxia.  Abdominal: Soft. He exhibits no distension. There is no tenderness.  Musculoskeletal: Normal range of motion.  Neurological: He is alert. He exhibits normal muscle tone. Coordination normal.  Alert to sternal rubbing and loud voice. Moving  all extremities.  Skin: Skin is warm and dry. No rash noted. He is not diaphoretic. No erythema. No pallor.  Psychiatric: He has a normal mood and affect. His behavior is normal.  Nursing note and vitals reviewed.    ED Treatments / Results  Labs (all labs ordered are listed, but only abnormal results are displayed) Labs Reviewed  CBC WITH DIFFERENTIAL/PLATELET - Abnormal; Notable for the following:       Result Value   HCT 38.9 (*)    All other components within normal limits   BASIC METABOLIC PANEL - Abnormal; Notable for the following:    Glucose, Bld 103 (*)    Creatinine, Ser 1.38 (*)    All other components within normal limits  RAPID URINE DRUG SCREEN, HOSP PERFORMED  URINALYSIS, ROUTINE W REFLEX MICROSCOPIC  I-STAT TROPONIN, ED    EKG  EKG Interpretation  Date/Time:  Sunday August 06 2017 00:33:04 EDT Ventricular Rate:  77 PR Interval:    QRS Duration: 103 QT Interval:  385 QTC Calculation: 436 R Axis:   60 Text Interpretation:  Sinus rhythm Baseline wander in lead(s) III Rate is slower Confirmed by Molpus, John ([id number]) on 08/06/2017 12:41:23 AM       Radiology No results found.  Procedures Procedures (including critical care time)  Medications Ordered in ED Medications  naloxone [facility]) injection 0.4 mg (0.4 mg Intravenous Given 08/06/17 0045)  ondansetron (ZOFRAN) injection 4 mg (4 mg Intravenous Given 08/06/17 0049)  sodium chloride 0.9 % bolus 1,000 mL (0 mLs Intravenous Stopped 08/06/17 0200)  naloxone (NARCAN) injection 0.4 mg (0.4 mg Intravenous Given 08/06/17 0049)  sodium chloride 0.9 % bolus 1,000 mL (0 mLs Intravenous Stopped 08/06/17 0330)  sodium chloride 0.9 % bolus 1,000 mL (0 mLs Intravenous Stopped 08/06/17 0610)     Initial Impression / Assessment and Plan / ED Course  I have reviewed the triage vital signs and the nursing notes.  Pertinent labs & imaging results that were available during my care of the patient were reviewed by me and considered in my medical decision making (see chart for details).     1:15 AM Patient presents for acute intoxication from an unknown substance. Friend brought the patient to the emergency department, noting that he has not been acting himself since this afternoon. He does have a history of IV heroin abuse. Pupils 5 mm bilaterally, minimally reactive. Patient also with mild respiratory depression, though he did not respond to Narcan 2. Will hydrate and monitor in the ED. Laboratory workup  at this time is reassuring.  6:45 AM Patient still with slurred speech, though he is more alert and easily awoken. He is able to tolerate water by mouth without difficulty. He has been well hydrated with multiple liters of IV fluids. Patient now A&Ox3, but is still obviously intoxicated; under the influence of an unknown substance. He will still not admit to any ingestion or overdose. Will allow to further sober in the ED. Anticipate eventual discharge.   7:10 AM Patient signed out to Leary Roca, PA-C at shift change who will assume care and disposition appropriately.   Final Clinical Impressions(s) / ED Diagnoses   Final diagnoses:  Altered mental status, unspecified altered mental status type    New Prescriptions New Prescriptions   No medications on file     Antony Madura, Cordelia Poche 08/06/17 0388    Paula Libra, MD 08/06/17 [phone]

## 2017-08-06 NOTE — Discharge Instructions (Signed)
If you develop worsening or new concerning symptoms you can return to the emergency department for re-evaluation.  ° °

## 2017-08-06 NOTE — ED Notes (Signed)
Pt brother at bedside.

## 2017-08-06 NOTE — ED Notes (Signed)
Pt sleeping quietly at this time with even, unlabored resp. Will continue to monitor 

## 2017-08-06 NOTE — ED Notes (Signed)
Pt pulled IV out. When questioned by staff regarding why he did that, he said "That was my only option". Pt also attempting to leave.

## 2017-08-06 NOTE — ED Notes (Signed)
Pt unable to use urinal on his own and refusing assistance. Staff attempted in and out catheter x3 but pt is Warden/ranger.

## 2017-08-06 NOTE — ED Notes (Signed)
Pt stated that he was ready to go and felt fine. Left AMA

## 2017-08-06 NOTE — ED Provider Notes (Signed)
This patient was handed off to me during change of shift by Antony Madura, PA-C.   Pertinent review is a 26 year old male who was brought in by friend for not acting himself. It was noted that these symptoms are similar to his Phenibut overdose in December 2017. At presentation the patient was able to wake to sternal rubbing and loud voice. Patient was noted to have 5 mm pupils bilaterally and minimally reactive. He was unresponsive to 2 rounds of Narcan. He was hydrated in the emergency department. At change of shift the patient was more alert with slurred speech, and at 3 and able to tolerate food and water by mouth. Patient signed out with the anticipation of eventual discharge.  Patient speech improved. He will not admit to what he took last night. Patient able to ambulate with difficulty.  Patient without signs of head trauma. Mentation appropriate. Follows commands. Pupils dilated but reactive. EOMI. Mild nystagmus noted. Normal peripheral fields. CN III-XII intact. Grossly moves all extremities 4.   Lungs clear. Heart RRR. Abdomen soft and non-tender.   10:23 AM Patient ambulates appropriately. The evaluation does not show pathology that would require ongoing emergent intervention or inpatient treatment. I am providing the patient resources for drug abuse. I advised the patient to return to the emergency department with new or worsening symptoms or new concerns. Specific return precautions discussed. The patient verbalized understanding and agreement with plan. All questions answered. No further questions at this time. The patient is hemodynamically stable, mentating appropriately and appears safe for discharge.      Jacinto Halim, PA-C 08/06/17 1104    Jacalyn Lefevre, MD 08/06/17 1501

## 2017-08-06 NOTE — ED Triage Notes (Signed)
Pt is brought in by a friend who reports that pt has been acting bizarre for several hours. Friend states is a known IVDU but his friend states he has never seen his like this before. He is non-verbal and shaking extremities during this triage assessment.

## 2017-08-06 NOTE — ED Notes (Addendum)
Pt ambulated to BR and back to room per hallway sitter w/o difficulty. Urine was not obtained

## 2017-08-14 ENCOUNTER — Emergency Department (HOSPITAL_COMMUNITY): Payer: Self-pay

## 2017-08-14 ENCOUNTER — Encounter (HOSPITAL_COMMUNITY): Payer: Self-pay

## 2017-08-14 ENCOUNTER — Inpatient Hospital Stay (HOSPITAL_COMMUNITY)
Admission: EM | Admit: 2017-08-14 | Discharge: 2017-08-17 | DRG: 917 | Disposition: A | Discharge status: Home / Self Care | Payer: Self-pay | Attending: Internal Medicine | Admitting: Internal Medicine

## 2017-08-14 DIAGNOSIS — G929 Unspecified toxic encephalopathy: Secondary | ICD-10-CM | POA: Diagnosis present

## 2017-08-14 DIAGNOSIS — E860 Dehydration: Secondary | ICD-10-CM | POA: Diagnosis present

## 2017-08-14 DIAGNOSIS — J9601 Acute respiratory failure with hypoxia: Secondary | ICD-10-CM | POA: Diagnosis present

## 2017-08-14 DIAGNOSIS — R945 Abnormal results of liver function studies: Secondary | ICD-10-CM

## 2017-08-14 DIAGNOSIS — R502 Drug induced fever: Secondary | ICD-10-CM

## 2017-08-14 DIAGNOSIS — R4182 Altered mental status, unspecified: Secondary | ICD-10-CM

## 2017-08-14 DIAGNOSIS — M6282 Rhabdomyolysis: Secondary | ICD-10-CM | POA: Diagnosis present

## 2017-08-14 DIAGNOSIS — Z791 Long term (current) use of non-steroidal anti-inflammatories (NSAID): Secondary | ICD-10-CM

## 2017-08-14 DIAGNOSIS — T424X2A Poisoning by benzodiazepines, intentional self-harm, initial encounter: Secondary | ICD-10-CM | POA: Diagnosis present

## 2017-08-14 DIAGNOSIS — F1120 Opioid dependence, uncomplicated: Secondary | ICD-10-CM | POA: Diagnosis present

## 2017-08-14 DIAGNOSIS — T402X2A Poisoning by other opioids, intentional self-harm, initial encounter: Principal | ICD-10-CM | POA: Diagnosis present

## 2017-08-14 DIAGNOSIS — F17210 Nicotine dependence, cigarettes, uncomplicated: Secondary | ICD-10-CM | POA: Diagnosis present

## 2017-08-14 DIAGNOSIS — T50902A Poisoning by unspecified drugs, medicaments and biological substances, intentional self-harm, initial encounter: Secondary | ICD-10-CM | POA: Diagnosis present

## 2017-08-14 DIAGNOSIS — T50901A Poisoning by unspecified drugs, medicaments and biological substances, accidental (unintentional), initial encounter: Secondary | ICD-10-CM | POA: Diagnosis present

## 2017-08-14 DIAGNOSIS — R509 Fever, unspecified: Secondary | ICD-10-CM | POA: Diagnosis present

## 2017-08-14 DIAGNOSIS — G9340 Encephalopathy, unspecified: Secondary | ICD-10-CM

## 2017-08-14 DIAGNOSIS — R7989 Other specified abnormal findings of blood chemistry: Secondary | ICD-10-CM | POA: Diagnosis present

## 2017-08-14 DIAGNOSIS — R946 Abnormal results of thyroid function studies: Secondary | ICD-10-CM | POA: Diagnosis present

## 2017-08-14 DIAGNOSIS — T50904A Poisoning by unspecified drugs, medicaments and biological substances, undetermined, initial encounter: Secondary | ICD-10-CM

## 2017-08-14 DIAGNOSIS — E876 Hypokalemia: Secondary | ICD-10-CM | POA: Diagnosis present

## 2017-08-14 DIAGNOSIS — G40909 Epilepsy, unspecified, not intractable, without status epilepticus: Secondary | ICD-10-CM | POA: Diagnosis present

## 2017-08-14 DIAGNOSIS — G92 Toxic encephalopathy: Secondary | ICD-10-CM | POA: Diagnosis present

## 2017-08-14 DIAGNOSIS — J690 Pneumonitis due to inhalation of food and vomit: Secondary | ICD-10-CM | POA: Diagnosis present

## 2017-08-14 LAB — URINALYSIS, ROUTINE W REFLEX MICROSCOPIC
BACTERIA UA: NONE SEEN
Glucose, UA: NEGATIVE mg/dL
KETONES UR: 20 mg/dL — AB
Leukocytes, UA: NEGATIVE
Nitrite: NEGATIVE
PROTEIN: 100 mg/dL — AB
RENAL EPITHELIAL (UACOMP): 15
SQUAMOUS EPITHELIAL / LPF: NONE SEEN
Specific Gravity, Urine: 1.02 (ref 1.005–1.030)
WBC UA: NONE SEEN WBC/hpf (ref 0–5)
pH: 8.5 — ABNORMAL HIGH (ref 5.0–8.0)

## 2017-08-14 LAB — CBC WITH DIFFERENTIAL/PLATELET
BASOS ABS: 0 10*3/uL (ref 0.0–0.1)
BASOS PCT: 0 %
EOS ABS: 0 10*3/uL (ref 0.0–0.7)
Eosinophils Relative: 0 %
HEMATOCRIT: 37.9 % — AB (ref 39.0–52.0)
HEMOGLOBIN: 12.4 g/dL — AB (ref 13.0–17.0)
Lymphocytes Relative: 6 %
Lymphs Abs: 0.6 10*3/uL — ABNORMAL LOW (ref 0.7–4.0)
MCH: 30 pg (ref 26.0–34.0)
MCHC: 32.7 g/dL (ref 30.0–36.0)
MCV: 91.5 fL (ref 78.0–100.0)
Monocytes Absolute: 0.9 10*3/uL (ref 0.1–1.0)
Monocytes Relative: 10 %
NEUTROS ABS: 7.7 10*3/uL (ref 1.7–7.7)
NEUTROS PCT: 84 %
Platelets: 258 10*3/uL (ref 150–400)
RBC: 4.14 MIL/uL — ABNORMAL LOW (ref 4.22–5.81)
RDW: 13.2 % (ref 11.5–15.5)
WBC: 9.2 10*3/uL (ref 4.0–10.5)

## 2017-08-14 LAB — RAPID URINE DRUG SCREEN, HOSP PERFORMED
AMPHETAMINES: NOT DETECTED
BARBITURATES: NOT DETECTED
Benzodiazepines: POSITIVE — AB
Cocaine: NOT DETECTED
Opiates: POSITIVE — AB
TETRAHYDROCANNABINOL: NOT DETECTED

## 2017-08-14 LAB — SALICYLATE LEVEL

## 2017-08-14 LAB — COMPREHENSIVE METABOLIC PANEL
ALK PHOS: 52 U/L (ref 38–126)
ALT: 94 U/L — AB (ref 17–63)
AST: 438 U/L — AB (ref 15–41)
Albumin: 4.1 g/dL (ref 3.5–5.0)
Anion gap: 9 (ref 5–15)
BILIRUBIN TOTAL: 0.8 mg/dL (ref 0.3–1.2)
BUN: 25 mg/dL — AB (ref 6–20)
CALCIUM: 9.2 mg/dL (ref 8.9–10.3)
CO2: 27 mmol/L (ref 22–32)
CREATININE: 1.24 mg/dL (ref 0.61–1.24)
Chloride: 106 mmol/L (ref 101–111)
GFR calc Af Amer: >60 mL/min (ref >60)
GLUCOSE: 107 mg/dL — AB (ref 65–99)
POTASSIUM: 3.9 mmol/L (ref 3.5–5.1)
Sodium: 142 mmol/L (ref 135–145)
TOTAL PROTEIN: 7.7 g/dL (ref 6.5–8.1)

## 2017-08-14 LAB — AMMONIA: AMMONIA: 14 umol/L (ref 9–35)

## 2017-08-14 LAB — ETHANOL: Alcohol, Ethyl (B): <5 mg/dL (ref <5)

## 2017-08-14 LAB — ACETAMINOPHEN LEVEL: Acetaminophen (Tylenol), Serum: <10 ug/mL — ABNORMAL LOW (ref 10–30)

## 2017-08-14 MED ORDER — LORAZEPAM 2 MG/ML IJ SOLN
INTRAMUSCULAR | Status: AC
  Filled 2017-08-14: qty 1

## 2017-08-14 MED ORDER — LIDOCAINE HCL (PF) 1 % IJ SOLN
INTRAMUSCULAR | Status: AC
  Administered 2017-08-15: 01:00:00
  Filled 2017-08-14: qty 30

## 2017-08-14 MED ORDER — LORAZEPAM 2 MG/ML IJ SOLN
1.0000 mg | Freq: Once | INTRAMUSCULAR | Status: AC
  Administered 2017-08-14: 1 mg via INTRAVENOUS
  Filled 2017-08-14: qty 1

## 2017-08-14 MED ORDER — THIAMINE HCL 100 MG/ML IJ SOLN
100.0000 mg | Freq: Every day | INTRAMUSCULAR | Status: DC
  Administered 2017-08-15: 100 mg via INTRAVENOUS
  Filled 2017-08-14: qty 2

## 2017-08-14 MED ORDER — SODIUM CHLORIDE 0.9 % IV SOLN: INTRAVENOUS | Status: DC

## 2017-08-14 NOTE — ED Notes (Signed)
Pt  ambulated with max assist.  Pt not able to void.

## 2017-08-14 NOTE — ED Notes (Signed)
Pt continually attempting to get out of bed, needing constant redirection. Patient is extremely unsteady on his feet. Explained to patient why he is unable to get up, continually attempting to get up.

## 2017-08-14 NOTE — ED Provider Notes (Signed)
[facility]-EMERGENCY DEPT Provider Note   CSN: [id number] Arrival date & time: 08/14/17  1707     History   Chief Complaint Chief Complaint  Patient presents with  . lethargic  . possible overdose    HPI Mark Strickland is a 26 y.o. male.  Patient is a 26 year old male with a history of opioid abuse who presents with altered mental status. Per EMS he was found lying on his side the road. He had pinpoint pupils at that time. [employer] found a bag with white crystals in it. Patient told EMS that he did take something but he does not know what it was although he denies any ingestions to me. Patient states he doesn't know what happened. He doesn't know why he was on the side of the road. He currently denies any complaints other than he is having some pain along his left ribs. History is limited due to altered mental status.      Past Medical History:  Diagnosis Date  . Anxiety   . Depressed   . Opiate addiction (HCC)   . Seizures (HCC)    childhood    Patient Active Problem List   Diagnosis Date Noted  . Elevated LFTs 08/14/2017  . Fever 08/14/2017  . Drug overdose, intentional (HCC) 12/03/2015  . Encephalopathy, toxic 12/03/2015    History reviewed. No pertinent surgical history.     Home Medications    Prior to Admission medications   Medication Sig Start Date End Date Taking? Authorizing Provider  acetaminophen (TYLENOL) 500 MG tablet Take 500 mg by mouth every 6 (six) hours as needed for moderate pain.   Yes [provider]  ibuprofen (ADVIL,MOTRIN) 200 MG tablet Take 400-600 mg by mouth every 6 (six) hours as needed for moderate pain.   Yes [provider]  amoxicillin-clavulanate (AUGMENTIN) 875-125 MG tablet Take 1 tablet by mouth every 12 (twelve) hours. Patient not taking: Reported on 08/14/2017 01/20/17   Audry Pili, PA-C  butalbital-acetaminophen-caffeine (FIORICET, ESGIC) [phone] MG tablet Take 1 tablet by mouth every 6 (six) hours as needed for  headache. Patient not taking: Reported on 08/14/2017 01/20/17 01/20/18  Audry Pili, PA-C  methocarbamol (ROBAXIN) 500 MG tablet Take 1 tablet (500 mg total) by mouth at bedtime and may repeat dose one time if needed. Patient not taking: Reported on 08/14/2017 07/14/17   Bethel Born, PA-C    Family History History reviewed. No pertinent family history.  Social History Social History  Substance Use Topics  . Smoking status: Current Every Day Smoker    Packs/day: 1.00    Types: Cigarettes  . Smokeless tobacco: Never Used  . Alcohol use Yes     Allergies   Patient has no known allergies.   Review of Systems Review of Systems  Unable to perform ROS: Mental status change     Physical Exam Updated Vital Signs BP 140/88 (BP Location: Left Arm)   Pulse 82   Temp (!) 100.8 F (38.2 C) (Rectal)   Resp 18   Ht 5\' 11"  (1.803 m)   Wt 79.4 kg (175 lb)   SpO2 97%   BMI 24.41 kg/m   Physical Exam  Constitutional: He appears well-developed and well-nourished.  HENT:  Head: Normocephalic and atraumatic.  Eyes: Pupils are equal, round, and reactive to light.  Pupils are 6 mm bilaterally  Neck: Normal range of motion. Neck supple.  No meningismus, no pain along the cervical thoracic or lumbosacral spine  Cardiovascular: Normal rate, regular rhythm and  normal heart sounds.   Pulmonary/Chest: Effort normal and breath sounds normal. No respiratory distress. He has no wheezes. He has no rales. He exhibits tenderness.  Patient has tenderness along the left mid and lower rib cage. There is no crepitus or deformity. No bruising or other signs of external trauma.  Abdominal: Soft. Bowel sounds are normal. There is no tenderness. There is no rebound and no guarding.  Musculoskeletal: Normal range of motion. He exhibits no edema.  Lymphadenopathy:    He has no cervical adenopathy.  Neurological:  Patient is responsive to verbal stimuli. He is alert and oriented only to person. He moves  all extremities symmetrically without focal deficits. No dysarthria. +clonus and mild hyperreflexia  Skin: Skin is warm and dry. No rash noted.  Psychiatric: He has a normal mood and affect.     ED Treatments / Results  Labs (all labs ordered are listed, but only abnormal results are displayed) Labs Reviewed  COMPREHENSIVE METABOLIC PANEL - Abnormal; Notable for the following:       Result Value   Glucose, Bld 107 (*)    BUN 25 (*)    AST 438 (*)    ALT 94 (*)    All other components within normal limits  ACETAMINOPHEN LEVEL - Abnormal; Notable for the following:    Acetaminophen (Tylenol), Serum <10 (*)    All other components within normal limits  RAPID URINE DRUG SCREEN, HOSP PERFORMED - Abnormal; Notable for the following:    Opiates POSITIVE (*)    Benzodiazepines POSITIVE (*)    All other components within normal limits  CBC WITH DIFFERENTIAL/PLATELET - Abnormal; Notable for the following:    RBC 4.14 (*)    Hemoglobin 12.4 (*)    HCT 37.9 (*)    Lymphs Abs 0.6 (*)    All other components within normal limits  URINALYSIS, ROUTINE W REFLEX MICROSCOPIC - Abnormal; Notable for the following:    APPearance CLOUDY (*)    pH 8.5 (*)    Hgb urine dipstick LARGE (*)    Bilirubin Urine SMALL (*)    Ketones, ur 20 (*)    Protein, ur 100 (*)    All other components within normal limits  CULTURE, BLOOD (ROUTINE X 2)  CULTURE, BLOOD (ROUTINE X 2)  CSF CULTURE  GRAM STAIN  SALICYLATE LEVEL  ETHANOL  AMMONIA  CK  LACTIC ACID, PLASMA  HIV ANTIBODY (ROUTINE TESTING)  HEPATITIS PANEL, ACUTE  PROTIME-INR  CSF CELL COUNT WITH DIFFERENTIAL  CSF CELL COUNT WITH DIFFERENTIAL  GLUCOSE, CSF  PROTEIN, CSF  HERPES SIMPLEX VIRUS(HSV) DNA BY PCR    EKG  EKG Interpretation  Date/Time:  Monday August 14 2017 18:00:05 EDT Ventricular Rate:  92 PR Interval:  132 QRS Duration: 88 QT Interval:  360 QTC Calculation: 445 R Axis:   72 Text Interpretation:  Normal sinus rhythm  Normal ECG since last tracing no significant change Confirmed by Rolan Bucco [phone]) on 08/14/2017 6:09:08 PM       Radiology Dg Ribs Unilateral W/chest Left  Result Date: 08/14/2017 CLINICAL DATA:  Lethargic, left-sided rib pain EXAM: LEFT RIBS AND CHEST - 3+ VIEW COMPARISON:  None. FINDINGS: AP semi-erect view of the chest demonstrates no acute infiltrate or effusion. Normal cardiomediastinal silhouette. No pneumothorax. Bilateral nipple rings. Left rib series demonstrates no acute displaced left rib fracture IMPRESSION: 1. Negative single-view chest 2. No acute displaced left rib fracture. Electronically Signed   By: Jasmine Pang M.D.   On:  08/14/2017 18:51   Ct Head Wo Contrast  Result Date: 08/14/2017 CLINICAL DATA:  Found lying the side of the road. Altered mental status. EXAM: CT HEAD WITHOUT CONTRAST CT CERVICAL SPINE WITHOUT CONTRAST TECHNIQUE: Multidetector CT imaging of the head and cervical spine was performed following the standard protocol without intravenous contrast. Multiplanar CT image reconstructions of the cervical spine were also generated. COMPARISON:  Head CT 01/14/2017 and 01/20/2017. FINDINGS: CT HEAD FINDINGS Brain: No mass lesion, intraparenchymal hemorrhage or extra-axial collection. No evidence of acute cortical infarct. Brain parenchyma and CSF-containing spaces are normal for age. Vascular: No hyperdense vessel or unexpected calcification. Skull: Normal visualized skull base, calvarium and extracranial soft tissues. Sinuses/Orbits: Partial opacification of the right frontal and anterior right ethmoid sinuses. Complete opacification of the right maxillary sinus. No mastoid or middle ear effusion. Normal orbits. CT CERVICAL SPINE FINDINGS Alignment: No static subluxation. Facets are aligned. Occipital condyles are normally positioned. Skull base and vertebrae: No acute fracture. Soft tissues and spinal canal: No prevertebral fluid or swelling. No visible canal hematoma.  Disc levels: No advanced spinal canal or neural foraminal stenosis. Upper chest: No pneumothorax, pulmonary nodule or pleural effusion. Other: Normal visualized paraspinal cervical soft tissues. IMPRESSION: 1. No acute intracranial abnormality. 2. No acute fracture or static subluxation of the cervical spine. 3. Right ostiomeatal complex pattern sinusitis. Electronically Signed   By: Deatra Robinson M.D.   On: 08/14/2017 23:37   Ct Cervical Spine Wo Contrast  Result Date: 08/14/2017 CLINICAL DATA:  Found lying the side of the road. Altered mental status. EXAM: CT HEAD WITHOUT CONTRAST CT CERVICAL SPINE WITHOUT CONTRAST TECHNIQUE: Multidetector CT imaging of the head and cervical spine was performed following the standard protocol without intravenous contrast. Multiplanar CT image reconstructions of the cervical spine were also generated. COMPARISON:  Head CT 01/14/2017 and 01/20/2017. FINDINGS: CT HEAD FINDINGS Brain: No mass lesion, intraparenchymal hemorrhage or extra-axial collection. No evidence of acute cortical infarct. Brain parenchyma and CSF-containing spaces are normal for age. Vascular: No hyperdense vessel or unexpected calcification. Skull: Normal visualized skull base, calvarium and extracranial soft tissues. Sinuses/Orbits: Partial opacification of the right frontal and anterior right ethmoid sinuses. Complete opacification of the right maxillary sinus. No mastoid or middle ear effusion. Normal orbits. CT CERVICAL SPINE FINDINGS Alignment: No static subluxation. Facets are aligned. Occipital condyles are normally positioned. Skull base and vertebrae: No acute fracture. Soft tissues and spinal canal: No prevertebral fluid or swelling. No visible canal hematoma. Disc levels: No advanced spinal canal or neural foraminal stenosis. Upper chest: No pneumothorax, pulmonary nodule or pleural effusion. Other: Normal visualized paraspinal cervical soft tissues. IMPRESSION: 1. No acute intracranial  abnormality. 2. No acute fracture or static subluxation of the cervical spine. 3. Right ostiomeatal complex pattern sinusitis. Electronically Signed   By: Deatra Robinson M.D.   On: 08/14/2017 23:37    Procedures .Lumbar Puncture Date/Time: 08/15/2017 12:13 AM Performed by: Melvinia Ashby Authorized by: Rolan Bucco   Consent:    Consent obtained:  Emergent situation Pre-procedure details:    Procedure purpose:  Diagnostic Anesthesia (see MAR for exact dosages):    Anesthesia method:  Local infiltration   Local anesthetic:  Lidocaine 1% w/o epi Procedure details:    Lumbar space:  L3-L4 interspace   Patient position:  R lateral decubitus   Needle gauge:  22   Needle type:  Diamond point   Needle length (in):  3.5   Ultrasound guidance: no     Number of attempts:  1   Fluid appearance:  Clear   Tubes of fluid:  4   Total volume (ml):  4 Post-procedure:    Puncture site:  Adhesive bandage applied   Patient tolerance of procedure:  Tolerated well, no immediate complications   (including critical care time)  Medications Ordered in ED Medications  0.9 %  sodium chloride infusion (not administered)  thiamine (B-1) injection 100 mg (not administered)  lidocaine (PF) (XYLOCAINE) 1 % injection (not administered)  LORazepam (ATIVAN) 2 MG/ML injection (not administered)  LORazepam (ATIVAN) injection 1 mg (1 mg Intravenous Given 08/14/17 2301)     Initial Impression / Assessment and Plan / ED Course  I have reviewed the triage vital signs and the nursing notes.  Pertinent labs & imaging results that were available during my care of the patient were reviewed by me and considered in my medical decision making (see chart for details).     Patient presents with a possible drug overdose. He became anxious and tremulous in the ED. Is continuing to be confused. He is awake but is confused. He is maintaining his airway. Head CT doesn't show any acute abnormalities other than possible  sinusitis. An LP was performed by me. Results are pending. Differential includes a drug overdose versus other ingestion versus encephalitis versus serotonin syndrome. He doesn't have any tachycardia. He has a mildly elevated rectal 10. No WBC count. Patient to be admitted by Dr. Adela Glimpse  Final Clinical Impressions(s) / ED Diagnoses   Final diagnoses:  Encephalopathy    New Prescriptions New Prescriptions   No medications on file     Rolan Bucco, MD 08/15/17 [phone]

## 2017-08-14 NOTE — ED Notes (Addendum)
Pt still continuing to attempt to get out of bed. Still unable to ambulate patient independently. Patient still shakey and unsteady on his feet. EDp notified.

## 2017-08-14 NOTE — ED Notes (Signed)
Bed: [facility] Expected date:  Expected time:  Means of arrival:  Comments: 
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
## 2017-08-15 ENCOUNTER — Observation Stay (HOSPITAL_COMMUNITY): Payer: Self-pay

## 2017-08-15 DIAGNOSIS — M6282 Rhabdomyolysis: Secondary | ICD-10-CM | POA: Diagnosis present

## 2017-08-15 DIAGNOSIS — G92 Toxic encephalopathy: Secondary | ICD-10-CM

## 2017-08-15 DIAGNOSIS — R410 Disorientation, unspecified: Secondary | ICD-10-CM

## 2017-08-15 DIAGNOSIS — T50901A Poisoning by unspecified drugs, medicaments and biological substances, accidental (unintentional), initial encounter: Secondary | ICD-10-CM | POA: Diagnosis present

## 2017-08-15 LAB — CSF CELL COUNT WITH DIFFERENTIAL
RBC COUNT CSF: 1 /mm3 — AB
RBC Count, CSF: 0 /mm3
Tube #: 1
Tube #: 4
WBC CSF: 1 /mm3 (ref 0–5)
WBC CSF: 1 /mm3 (ref 0–5)

## 2017-08-15 LAB — COMPREHENSIVE METABOLIC PANEL
ALBUMIN: 3 g/dL — AB (ref 3.5–5.0)
ALT: 87 U/L — AB (ref 17–63)
AST: 444 U/L — AB (ref 15–41)
Alkaline Phosphatase: 39 U/L (ref 38–126)
Anion gap: 7 (ref 5–15)
BUN: 21 mg/dL — AB (ref 6–20)
CHLORIDE: 110 mmol/L (ref 101–111)
CO2: 21 mmol/L — AB (ref 22–32)
CREATININE: 0.93 mg/dL (ref 0.61–1.24)
Calcium: 7.6 mg/dL — ABNORMAL LOW (ref 8.9–10.3)
GFR calc Af Amer: >60 mL/min (ref >60)
GLUCOSE: 118 mg/dL — AB (ref 65–99)
POTASSIUM: 3.7 mmol/L (ref 3.5–5.1)
Sodium: 138 mmol/L (ref 135–145)
Total Bilirubin: 0.6 mg/dL (ref 0.3–1.2)
Total Protein: 5.9 g/dL — ABNORMAL LOW (ref 6.5–8.1)

## 2017-08-15 LAB — CBC
HEMATOCRIT: 31.7 % — AB (ref 39.0–52.0)
Hemoglobin: 10.4 g/dL — ABNORMAL LOW (ref 13.0–17.0)
MCH: 29.6 pg (ref 26.0–34.0)
MCHC: 32.8 g/dL (ref 30.0–36.0)
MCV: 90.3 fL (ref 78.0–100.0)
PLATELETS: 184 10*3/uL (ref 150–400)
RBC: 3.51 MIL/uL — ABNORMAL LOW (ref 4.22–5.81)
RDW: 13 % (ref 11.5–15.5)
WBC: 7.2 10*3/uL (ref 4.0–10.5)

## 2017-08-15 LAB — PROTEIN, CSF: TOTAL PROTEIN, CSF: 19 mg/dL (ref 15–45)

## 2017-08-15 LAB — CK
Total CK: 23204 U/L — ABNORMAL HIGH (ref 49–397)
Total CK: 15727 U/L — ABNORMAL HIGH (ref 49–397)

## 2017-08-15 LAB — MRSA PCR SCREENING: MRSA BY PCR: NEGATIVE

## 2017-08-15 LAB — TSH: TSH: 0.055 u[IU]/mL — AB (ref 0.350–4.500)

## 2017-08-15 LAB — PROTIME-INR
INR: 1.08
Prothrombin Time: 14.1 seconds (ref 11.4–15.2)

## 2017-08-15 LAB — GLUCOSE, CSF: Glucose, CSF: 80 mg/dL — ABNORMAL HIGH (ref 40–70)

## 2017-08-15 LAB — PHOSPHORUS: PHOSPHORUS: 1.8 mg/dL — AB (ref 2.5–4.6)

## 2017-08-15 LAB — PROCALCITONIN: Procalcitonin: 0.16 ng/mL

## 2017-08-15 LAB — MAGNESIUM: Magnesium: 1.5 mg/dL — ABNORMAL LOW (ref 1.7–2.4)

## 2017-08-15 LAB — HIV ANTIBODY (ROUTINE TESTING W REFLEX): HIV SCREEN 4TH GENERATION: NONREACTIVE

## 2017-08-15 LAB — LACTIC ACID, PLASMA: LACTIC ACID, VENOUS: 1.1 mmol/L (ref 0.5–1.9)

## 2017-08-15 MED ORDER — LORAZEPAM 2 MG/ML IJ SOLN
0.0000 mg | Freq: Two times a day (BID) | INTRAMUSCULAR | Status: DC
  Filled 2017-08-15: qty 1

## 2017-08-15 MED ORDER — POTASSIUM PHOSPHATES 15 MMOLE/5ML IV SOLN
20.0000 mmol | Freq: Once | INTRAVENOUS | Status: AC
  Administered 2017-08-15: 20 mmol via INTRAVENOUS
  Filled 2017-08-15: qty 6.67

## 2017-08-15 MED ORDER — GADOBENATE DIMEGLUMINE 529 MG/ML IV SOLN
15.0000 mL | Freq: Once | INTRAVENOUS | Status: AC | PRN
  Administered 2017-08-15: 15 mL via INTRAVENOUS

## 2017-08-15 MED ORDER — HALOPERIDOL LACTATE 5 MG/ML IJ SOLN
2.0000 mg | Freq: Four times a day (QID) | INTRAMUSCULAR | Status: DC | PRN
  Administered 2017-08-15 – 2017-08-17 (×4): 2 mg via INTRAVENOUS
  Filled 2017-08-15 (×4): qty 1

## 2017-08-15 MED ORDER — FOLIC ACID 1 MG PO TABS
1.0000 mg | ORAL_TABLET | Freq: Every day | ORAL | Status: DC
  Administered 2017-08-15 – 2017-08-17 (×3): 1 mg via ORAL
  Filled 2017-08-15 (×3): qty 1

## 2017-08-15 MED ORDER — SODIUM CHLORIDE 0.9 % IV SOLN
3.0000 g | Freq: Four times a day (QID) | INTRAVENOUS | Status: DC
  Administered 2017-08-15 – 2017-08-17 (×8): 3 g via INTRAVENOUS
  Filled 2017-08-15 (×9): qty 3

## 2017-08-15 MED ORDER — IBUPROFEN 200 MG PO TABS
400.0000 mg | ORAL_TABLET | Freq: Four times a day (QID) | ORAL | Status: DC | PRN
  Administered 2017-08-15 – 2017-08-17 (×5): 400 mg via ORAL
  Filled 2017-08-15 (×5): qty 2

## 2017-08-15 MED ORDER — LORAZEPAM 2 MG/ML IJ SOLN
1.0000 mg | Freq: Once | INTRAMUSCULAR | Status: AC
  Administered 2017-08-15: 1 mg via INTRAVENOUS
  Filled 2017-08-15: qty 1

## 2017-08-15 MED ORDER — LORAZEPAM 2 MG/ML IJ SOLN
1.0000 mg | Freq: Four times a day (QID) | INTRAMUSCULAR | Status: DC | PRN
  Administered 2017-08-16 – 2017-08-17 (×2): 1 mg via INTRAVENOUS
  Filled 2017-08-15 (×2): qty 0.5

## 2017-08-15 MED ORDER — HALOPERIDOL LACTATE 5 MG/ML IJ SOLN
10.0000 mg | Freq: Once | INTRAMUSCULAR | Status: AC
  Administered 2017-08-15: 10 mg via INTRAVENOUS
  Filled 2017-08-15: qty 2

## 2017-08-15 MED ORDER — SODIUM CHLORIDE 0.9 % IV SOLN: INTRAVENOUS | Status: DC

## 2017-08-15 MED ORDER — HALOPERIDOL LACTATE 5 MG/ML IJ SOLN
2.5000 mg | Freq: Once | INTRAMUSCULAR | Status: AC
  Administered 2017-08-15: 2.5 mg via INTRAVENOUS
  Filled 2017-08-15: qty 1

## 2017-08-15 MED ORDER — MAGNESIUM SULFATE 4 GM/100ML IV SOLN
4.0000 g | Freq: Once | INTRAVENOUS | Status: AC
  Administered 2017-08-15: 4 g via INTRAVENOUS
  Filled 2017-08-15: qty 100

## 2017-08-15 MED ORDER — NICOTINE 21 MG/24HR TD PT24
21.0000 mg | MEDICATED_PATCH | Freq: Every day | TRANSDERMAL | Status: DC
  Administered 2017-08-15 – 2017-08-17 (×3): 21 mg via TRANSDERMAL
  Filled 2017-08-15 (×3): qty 1

## 2017-08-15 MED ORDER — HALOPERIDOL LACTATE 5 MG/ML IJ SOLN
2.5000 mg | Freq: Once | INTRAMUSCULAR | Status: AC | PRN
  Administered 2017-08-15: 2.5 mg via INTRAVENOUS
  Filled 2017-08-15: qty 1

## 2017-08-15 MED ORDER — LORAZEPAM 1 MG PO TABS
1.0000 mg | ORAL_TABLET | Freq: Four times a day (QID) | ORAL | Status: DC | PRN
  Administered 2017-08-15: 1 mg via ORAL
  Filled 2017-08-15 (×2): qty 1

## 2017-08-15 MED ORDER — VITAMIN B-1 100 MG PO TABS: 100.0000 mg | ORAL_TABLET | Freq: Every day | ORAL | Status: DC

## 2017-08-15 MED ORDER — KCL IN DEXTROSE-NACL 10-5-0.45 MEQ/L-%-% IV SOLN
INTRAVENOUS | Status: DC
  Administered 2017-08-15 – 2017-08-17 (×5): via INTRAVENOUS
  Filled 2017-08-15 (×7): qty 1000

## 2017-08-15 MED ORDER — LORAZEPAM 2 MG/ML IJ SOLN
0.0000 mg | Freq: Four times a day (QID) | INTRAMUSCULAR | Status: AC
  Administered 2017-08-16: 2 mg via INTRAVENOUS
  Filled 2017-08-15 (×2): qty 1

## 2017-08-15 MED ORDER — ONDANSETRON HCL 4 MG/2ML IJ SOLN
4.0000 mg | Freq: Four times a day (QID) | INTRAMUSCULAR | Status: DC | PRN
  Administered 2017-08-15: 4 mg via INTRAVENOUS
  Filled 2017-08-15: qty 2

## 2017-08-15 MED ORDER — ACETAMINOPHEN 325 MG PO TABS
650.0000 mg | ORAL_TABLET | Freq: Four times a day (QID) | ORAL | Status: DC | PRN
  Administered 2017-08-15: 650 mg via ORAL
  Filled 2017-08-15 (×2): qty 2

## 2017-08-15 MED ORDER — SODIUM CHLORIDE 0.9 % IV BOLUS (SEPSIS)
4000.0000 mL | Freq: Once | INTRAVENOUS | Status: AC
  Administered 2017-08-15: 4000 mL via INTRAVENOUS

## 2017-08-15 MED ORDER — ADULT MULTIVITAMIN W/MINERALS CH
1.0000 | ORAL_TABLET | Freq: Every day | ORAL | Status: DC
  Administered 2017-08-15 – 2017-08-17 (×3): 1 via ORAL
  Filled 2017-08-15 (×3): qty 1

## 2017-08-15 MED ORDER — HALOPERIDOL LACTATE 5 MG/ML IJ SOLN: 2.0000 mg | Freq: Once | INTRAMUSCULAR | Status: DC

## 2017-08-15 MED ORDER — HALOPERIDOL LACTATE 5 MG/ML IJ SOLN
2.0000 mg | Freq: Once | INTRAMUSCULAR | Status: AC
  Administered 2017-08-15: 2 mg via INTRAVENOUS
  Filled 2017-08-15: qty 1

## 2017-08-15 NOTE — Progress Notes (Signed)
Pharmacy Antibiotic Note  Mark Strickland is a 26 y.o. male admitted on 08/14/2017 with overdose.  Pharmacy has been consulted for Unasyn dosing for aspiration PNA. WBC WNL, Cr WNL. Wt 75 kg.   Plan: Unasysn 3 gm IV q6h  Height: 5\' 11"  (180.3 cm) Weight: 166 lb 10.7 oz (75.6 kg) IBW/kg (Calculated) : 75.3  Temp (24hrs), Avg:99.6 F (37.6 C), Min:98.2 F (36.8 C), Max:100.8 F (38.2 C)   Recent Labs Lab 08/14/17 1754 08/15/17 0029 08/15/17 0614 08/15/17 0638  WBC 9.2  --   --  7.2  CREATININE 1.24  --  0.93  --   LATICACIDVEN  --  1.1  --   --     Estimated Creatinine Clearance: 127.1 mL/min (by C-G formula based on SCr of 0.93 mg/dL).    No Known Allergies  Thank you for allowing pharmacy to be a part of this patient's care.  Herby Abraham, Pharm.D. [phone] 08/15/2017 4:18 PM

## 2017-08-15 NOTE — Progress Notes (Addendum)
PROGRESS NOTE  Mark Strickland  ZOX:[id number] DOB: 02/01/1991 DOA: 08/14/2017 PCP: Patient, No Pcp Per  Brief Narrative:   The patient is a 26 year old male with history of IV drug use, particularly heroin and also benzodiazepines and other substances, seizures, depression and anxiety. The patient was found lying on the side of the road with pinpoint pupils, talking nonsensically. In the emergency department, his urine drug screen was positive for opiates and benzodiazepines. He admitted that he had taken something else but could not state what that was.  He had a low-grade fever, was hyperreflexic, pinpoint pupils, urinary retention. He was admitted to the stepdown unit and started on IV fluids.  He was also found to have rhabdomyolysis which likely explains his elevated LFTs.  He had an LP to exclude meningitis and there is low suspicion for meningitis at this point. His neck was supple. He had recurrent fever today chest x-ray reveals a possible aspiration pneumonia. Alternatively, he has recently been using IV drugs and is at risk for bacteremia and endocarditis. Repeating blood cultures and starting antibiotics for aspiration pneumonia and can escalate if any of his blood cultures become positive.  Increasing back pain over the course of the day today. Ordered lumbar MRI to evaluate for epidural abscess, discitis, osteomyelitis.  Assessment & Plan:   Active Problems:   Drug overdose, intentional (HCC)   Encephalopathy, toxic   Elevated LFTs   Fever   Polysubstance overdose   Rhabdomyolysis   Toxic encephalopathy secondary to drug use, starting to wake up some -  Continue when necessary Haldol -  QTc on tele -  Start CIWA protocol for possible benzodiazepine withdrawal -  Restraints as needed -  Discontinue Foley catheter for voiding trial, if he retains we will replace the Foley catheter  Acute hypoxic respiratory failure, likely secondary to hypoventilation during period of  acute intoxication but may also represent aspiration pneumonia -  Attempt to wean off oxygen -  Unasyn -  Incentive spirometry once able to be compliant with instructions, currently too confused  Fevers -  Repeat blood culture today and again in the morning -  Treating as above for aspiration pneumonia -  Lumbar spine MRI  Back pain could be due to rhabdo, discitis/osteomyelitis -  Ibuprofen and tylenol -  Work up as above  Rhabdomyolysis with elevated LFTs -  IV fluids -  Repeat CMP in a.m. as well as CK -  Hepatitis panel pending  History of IVDA -  Patient pre-contemplative -  Family would like him to enter inpatient rehabilitation, advised them to call around but that our staff does not arrange for transfer to inpatient rehabilitation from the hospital -  HIV NR and hepatitis panel  Hypophosphatemia -  Potassium phosphate   Seizure disorder -  Seizure precautions, patient not on AEDs  TSH low -  Check free t4 in AM  DVT prophylaxis:  lovenox Code Status:  full Family Communication:  Patient and his parents Disposition Plan:  Likely home once bacteremia excluded, CK trending down, patient alert and able to eat and drink   Consultants:   none  Procedures:  none  Antimicrobials:  Anti-infectives    Start     Dose/Rate Route Frequency Ordered Stop   08/15/17 1800  Ampicillin-Sulbactam (UNASYN) 3 g in sodium chloride 0.9 % 100 mL IVPB     3 g 200 mL/hr over 30 Minutes Intravenous Every 6 hours 08/15/17 1616  Subjective:  Confused, not able to contribute reliably to history due to intoxication.  C/o back pains  Objective: Vitals:   08/15/17 1306 08/15/17 1400 08/15/17 1544 08/15/17 1602  BP: 134/78 (!) 138/104  (!) 116/51  Pulse: (!) 55 69    Resp: 15     Temp: 98.2 F (36.8 C)  100.1 F (37.8 C)   TempSrc: Oral  Oral   SpO2: 93% 99%    Weight:      Height:        Intake/Output Summary (Last 24 hours) at 08/15/17 1626 Last data filed at  08/15/17 1505  Gross per 24 hour  Intake               60 ml  Output             1475 ml  Net            -1415 ml   Filed Weights   08/14/17 1724 08/15/17 0153  Weight: 79.4 kg (175 lb) 75.6 kg (166 lb 10.7 oz)    Examination:  General exam:  Adult male, diaphoretic and flushed in the face, not maintaining good eye contact and talking nonsensically.  No acute distress.  HEENT:  NCAT, MMM Respiratory system:  Clear anteriorly, no wheezes, rales, or rhonchi Cardiovascular system: Regular rate and rhythm, normal S1/S2. No murmurs, rubs, gallops or clicks.  Warm extremities Gastrointestinal system: Normal active bowel sounds, soft, nondistended, nontender. MSK:  Normal tone and bulk, no lower extremity edema.  Very tender along the lumbar spinous processes and a left paraspinous muscles.   Neuro:  Grossly intact    Data Reviewed: I have personally reviewed following labs and imaging studies  CBC:  Recent Labs Lab 08/14/17 1754 08/15/17 0638  WBC 9.2 7.2  NEUTROABS 7.7  --   HGB 12.4* 10.4*  HCT 37.9* 31.7*  MCV 91.5 90.3  PLT 258 184   Basic Metabolic Panel:  Recent Labs Lab 08/14/17 1754 08/15/17 0614  NA 142 138  K 3.9 3.7  CL 106 110  CO2 27 21*  GLUCOSE 107* 118*  BUN 25* 21*  CREATININE 1.24 0.93  CALCIUM 9.2 7.6*  MG  --  1.5*  PHOS  --  1.8*   GFR: Estimated Creatinine Clearance: 127.1 mL/min (by C-G formula based on SCr of 0.93 mg/dL). Liver Function Tests:  Recent Labs Lab 08/14/17 1754 08/15/17 0614  AST 438* 444*  ALT 94* 87*  ALKPHOS 52 39  BILITOT 0.8 0.6  PROT 7.7 5.9*  ALBUMIN 4.1 3.0*   No results for input(s): LIPASE, AMYLASE in the last 168 hours.  Recent Labs Lab 08/14/17 2318  AMMONIA 14   Coagulation Profile:  Recent Labs Lab 08/15/17 0029  INR 1.08   Cardiac Enzymes:  Recent Labs Lab 08/14/17 1754 08/15/17 0614  CKTOTAL 23,204* 15,727*   BNP (last 3 results) No results for input(s): PROBNP in the last 8760  hours. HbA1C: No results for input(s): HGBA1C in the last 72 hours. CBG: No results for input(s): GLUCAP in the last 168 hours. Lipid Profile: No results for input(s): CHOL, HDL, LDLCALC, TRIG, CHOLHDL, LDLDIRECT in the last 72 hours. Thyroid Function Tests:  Recent Labs  08/15/17 0638  TSH 0.055*   Anemia Panel: No results for input(s): VITAMINB12, FOLATE, FERRITIN, TIBC, IRON, RETICCTPCT in the last 72 hours. Urine analysis:    Component Value Date/Time   COLORURINE YELLOW 08/14/2017 1924   APPEARANCEUR CLOUDY (A) 08/14/2017 1924   LABSPEC  1.020 08/14/2017 1924   PHURINE 8.5 (H) 08/14/2017 1924   GLUCOSEU NEGATIVE 08/14/2017 1924   HGBUR LARGE (A) 08/14/2017 1924   BILIRUBINUR SMALL (A) 08/14/2017 1924   KETONESUR 20 (A) 08/14/2017 1924   PROTEINUR 100 (A) 08/14/2017 1924   NITRITE NEGATIVE 08/14/2017 1924   LEUKOCYTESUR NEGATIVE 08/14/2017 1924   Sepsis Labs: @LABRCNTIP (procalcitonin:4,lacticidven:4)  ) Recent Results (from the past 240 hour(s))  CSF culture     Status: None (Preliminary result)   Collection Time: 08/15/17 12:04 AM  Result Value Ref Range Status   Specimen Description CSF  Final   Special Requests Normal  Final   Gram Stain   Final    NO ORGANISMS SEEN NO WBC SEEN CYTOSPIN Gram Stain Report Called to,Read Back By and Verified With: Larina Earthly [phone] @ 0130 BY J SCOTTON    Culture PENDING  Incomplete   Report Status PENDING  Incomplete  MRSA PCR Screening     Status: None   Collection Time: 08/15/17 12:50 PM  Result Value Ref Range Status   MRSA by PCR NEGATIVE NEGATIVE Final    Comment:        The GeneXpert MRSA Assay (FDA approved for NASAL specimens only), is one component of a comprehensive MRSA colonization surveillance program. It is not intended to diagnose MRSA infection nor to guide or monitor treatment for MRSA infections.       Radiology Studies: Dg Ribs Unilateral W/chest Left  Result Date: 08/14/2017 CLINICAL  DATA:  Lethargic, left-sided rib pain EXAM: LEFT RIBS AND CHEST - 3+ VIEW COMPARISON:  None. FINDINGS: AP semi-erect view of the chest demonstrates no acute infiltrate or effusion. Normal cardiomediastinal silhouette. No pneumothorax. Bilateral nipple rings. Left rib series demonstrates no acute displaced left rib fracture IMPRESSION: 1. Negative single-view chest 2. No acute displaced left rib fracture. Electronically Signed   By: Jasmine Pang M.D.   On: 08/14/2017 18:51   Ct Head Wo Contrast  Result Date: 08/14/2017 CLINICAL DATA:  Found lying the side of the road. Altered mental status. EXAM: CT HEAD WITHOUT CONTRAST CT CERVICAL SPINE WITHOUT CONTRAST TECHNIQUE: Multidetector CT imaging of the head and cervical spine was performed following the standard protocol without intravenous contrast. Multiplanar CT image reconstructions of the cervical spine were also generated. COMPARISON:  Head CT 01/14/2017 and 01/20/2017. FINDINGS: CT HEAD FINDINGS Brain: No mass lesion, intraparenchymal hemorrhage or extra-axial collection. No evidence of acute cortical infarct. Brain parenchyma and CSF-containing spaces are normal for age. Vascular: No hyperdense vessel or unexpected calcification. Skull: Normal visualized skull base, calvarium and extracranial soft tissues. Sinuses/Orbits: Partial opacification of the right frontal and anterior right ethmoid sinuses. Complete opacification of the right maxillary sinus. No mastoid or middle ear effusion. Normal orbits. CT CERVICAL SPINE FINDINGS Alignment: No static subluxation. Facets are aligned. Occipital condyles are normally positioned. Skull base and vertebrae: No acute fracture. Soft tissues and spinal canal: No prevertebral fluid or swelling. No visible canal hematoma. Disc levels: No advanced spinal canal or neural foraminal stenosis. Upper chest: No pneumothorax, pulmonary nodule or pleural effusion. Other: Normal visualized paraspinal cervical soft tissues.  IMPRESSION: 1. No acute intracranial abnormality. 2. No acute fracture or static subluxation of the cervical spine. 3. Right ostiomeatal complex pattern sinusitis. Electronically Signed   By: Deatra Robinson M.D.   On: 08/14/2017 23:37   Ct Cervical Spine Wo Contrast  Result Date: 08/14/2017 CLINICAL DATA:  Found lying the side of the road. Altered mental status. EXAM: CT HEAD WITHOUT  CONTRAST CT CERVICAL SPINE WITHOUT CONTRAST TECHNIQUE: Multidetector CT imaging of the head and cervical spine was performed following the standard protocol without intravenous contrast. Multiplanar CT image reconstructions of the cervical spine were also generated. COMPARISON:  Head CT 01/14/2017 and 01/20/2017. FINDINGS: CT HEAD FINDINGS Brain: No mass lesion, intraparenchymal hemorrhage or extra-axial collection. No evidence of acute cortical infarct. Brain parenchyma and CSF-containing spaces are normal for age. Vascular: No hyperdense vessel or unexpected calcification. Skull: Normal visualized skull base, calvarium and extracranial soft tissues. Sinuses/Orbits: Partial opacification of the right frontal and anterior right ethmoid sinuses. Complete opacification of the right maxillary sinus. No mastoid or middle ear effusion. Normal orbits. CT CERVICAL SPINE FINDINGS Alignment: No static subluxation. Facets are aligned. Occipital condyles are normally positioned. Skull base and vertebrae: No acute fracture. Soft tissues and spinal canal: No prevertebral fluid or swelling. No visible canal hematoma. Disc levels: No advanced spinal canal or neural foraminal stenosis. Upper chest: No pneumothorax, pulmonary nodule or pleural effusion. Other: Normal visualized paraspinal cervical soft tissues. IMPRESSION: 1. No acute intracranial abnormality. 2. No acute fracture or static subluxation of the cervical spine. 3. Right ostiomeatal complex pattern sinusitis. Electronically Signed   By: Deatra Robinson M.D.   On: 08/14/2017 23:37   Dg  Chest Port 1 View  Result Date: 08/15/2017 CLINICAL DATA:  Fever.  Altered mental status.  Drug overdose. EXAM: PORTABLE CHEST 1 VIEW COMPARISON:  08/14/2017. FINDINGS: Mild fullness of the upper mediastinum most likely related to AP portable technique and slight lordotic projection. Cardiomegaly. Scratched it mild right mid lung field subsegmental atelectasis. Mild infiltrate left lung base cannot be excluded. No pleural effusion or pneumothorax . IMPRESSION: Mild right mid lung field subsegmental atelectasis. Mild infiltrate left lung base cannot be excluded . Electronically Signed   By: Maisie Fus  Register   On: 08/15/2017 10:06     Scheduled Meds: . folic acid  1 mg Oral Daily  . LORazepam  0-4 mg Intravenous Q6H   Followed by  . [START ON 08/17/2017] LORazepam  0-4 mg Intravenous Q12H  . multivitamin with minerals  1 tablet Oral Daily  . nicotine  21 mg Transdermal Daily  . thiamine injection  100 mg Intravenous Daily   Continuous Infusions: . ampicillin-sulbactam (UNASYN) IV    . dextrose 5 % and 0.45 % NaCl with KCl 10 mEq/L 150 mL/hr at 08/15/17 0906  . potassium PHOSPHATE IVPB (mmol) 20 mmol (08/15/17 1550)     LOS: 0 days    Time spent: 30 min    Renae Fickle, MD [facility] Pager [phone]  If 7PM-7AM, please contact night-coverage [email or web address] Password [facility] 08/15/2017, 4:26 PM

## 2017-08-15 NOTE — Care Management Note (Signed)
Case Management Note  Patient Details  Name: Yaasir Trotta MRN: [id number] Date of Birth: 23-Jun-1991  Subjective/Objective:                  overdose  Action/Plan: Date:  August 15, 2017 Chart reviewed for concurrent status and case management needs. Will continue to follow patient progress. Discharge Planning: following for needs Expected discharge date: [id number] Marcelle Smiling, BSN, [place], [place]   [phone]  Expected Discharge Date:                  Expected Discharge Plan:  Home/Self Care  In-House Referral:     Discharge planning Services  CM Consult  Post Acute Care Choice:    Choice offered to:     DME Arranged:    DME Agency:     HH Arranged:    HH Agency:     Status of Service:  In process, will continue to follow  If discussed at Long Length of Stay Meetings, dates discussed:    Additional Comments:  Golda Acre, RN 08/15/2017, 8:32 AM

## 2017-08-16 LAB — COMPREHENSIVE METABOLIC PANEL
ALBUMIN: 3 g/dL — AB (ref 3.5–5.0)
ALK PHOS: 40 U/L (ref 38–126)
ALT: 91 U/L — ABNORMAL HIGH (ref 17–63)
ANION GAP: 6 (ref 5–15)
AST: 384 U/L — AB (ref 15–41)
BILIRUBIN TOTAL: 0.7 mg/dL (ref 0.3–1.2)
BUN: 11 mg/dL (ref 6–20)
CO2: 25 mmol/L (ref 22–32)
Calcium: 8.1 mg/dL — ABNORMAL LOW (ref 8.9–10.3)
Chloride: 109 mmol/L (ref 101–111)
Creatinine, Ser: 0.81 mg/dL (ref 0.61–1.24)
GFR calc Af Amer: >60 mL/min (ref >60)
GFR calc non Af Amer: >60 mL/min (ref >60)
GLUCOSE: 123 mg/dL — AB (ref 65–99)
POTASSIUM: 3.3 mmol/L — AB (ref 3.5–5.1)
SODIUM: 140 mmol/L (ref 135–145)
TOTAL PROTEIN: 6.1 g/dL — AB (ref 6.5–8.1)

## 2017-08-16 LAB — CBC
HEMATOCRIT: 32.5 % — AB (ref 39.0–52.0)
HEMOGLOBIN: 10.9 g/dL — AB (ref 13.0–17.0)
MCH: 29.7 pg (ref 26.0–34.0)
MCHC: 33.5 g/dL (ref 30.0–36.0)
MCV: 88.6 fL (ref 78.0–100.0)
Platelets: 189 10*3/uL (ref 150–400)
RBC: 3.67 MIL/uL — ABNORMAL LOW (ref 4.22–5.81)
RDW: 12.8 % (ref 11.5–15.5)
WBC: 9.2 10*3/uL (ref 4.0–10.5)

## 2017-08-16 LAB — HEPATITIS PANEL, ACUTE
HEP B S AG: NEGATIVE
Hep A IgM: NEGATIVE
Hep B C IgM: NEGATIVE

## 2017-08-16 LAB — MAGNESIUM: Magnesium: 1.9 mg/dL (ref 1.7–2.4)

## 2017-08-16 LAB — HERPES SIMPLEX VIRUS(HSV) DNA BY PCR
HSV 1 DNA: NEGATIVE
HSV 2 DNA: NEGATIVE

## 2017-08-16 LAB — CK: CK TOTAL: 16150 U/L — AB (ref 49–397)

## 2017-08-16 LAB — T4, FREE: Free T4: 1.03 ng/dL (ref 0.61–1.12)

## 2017-08-16 MED ORDER — ENOXAPARIN SODIUM 40 MG/0.4ML ~~LOC~~ SOLN
40.0000 mg | SUBCUTANEOUS | Status: DC
  Filled 2017-08-16: qty 0.4

## 2017-08-16 MED ORDER — LORAZEPAM 2 MG/ML IJ SOLN
2.0000 mg | Freq: Once | INTRAMUSCULAR | Status: AC
  Administered 2017-08-16: 2 mg via INTRAVENOUS
  Filled 2017-08-16: qty 1

## 2017-08-16 MED ORDER — VITAMIN B-1 100 MG PO TABS
100.0000 mg | ORAL_TABLET | Freq: Every day | ORAL | Status: DC
  Administered 2017-08-16 – 2017-08-17 (×2): 100 mg via ORAL
  Filled 2017-08-16 (×2): qty 1

## 2017-08-16 MED ORDER — POTASSIUM CHLORIDE CRYS ER 20 MEQ PO TBCR
40.0000 meq | EXTENDED_RELEASE_TABLET | Freq: Once | ORAL | Status: AC
  Administered 2017-08-16: 40 meq via ORAL
  Filled 2017-08-16: qty 2

## 2017-08-16 MED ORDER — METHOCARBAMOL 500 MG PO TABS
500.0000 mg | ORAL_TABLET | Freq: Three times a day (TID) | ORAL | Status: DC | PRN
  Administered 2017-08-16 – 2017-08-17 (×2): 500 mg via ORAL
  Filled 2017-08-16 (×2): qty 1

## 2017-08-16 NOTE — Progress Notes (Signed)
Pt wanted to leave tonight. Very restless and anxious. Called PA and received orders for Ativan to be given.  Pt agreed to try this and wait to talk to doctor in the morning.  At this time he is resting in his bed. Will continue to monitor.

## 2017-08-16 NOTE — Progress Notes (Signed)
PROGRESS NOTE  Mark Strickland  YHC:[id number] DOB: 1991-10-15 DOA: 08/14/2017   PCP: Patient, No Pcp Per  Brief Narrative:   The patient is a 26 year old male with history of IV drug use, particularly heroin and also benzodiazepines and other substances, seizures, depression and anxiety. The patient was found lying on the side of the road with pinpoint pupils, talking nonsensically. In the emergency department, his urine drug screen was positive for opiates and benzodiazepines. He admitted that he had taken something else but could not state what that was.  He had a low-grade fever, was hyperreflexic, pinpoint pupils, urinary retention. He was admitted to the stepdown unit and started on IV fluids.  He was also found to have rhabdomyolysis which likely explains his elevated LFTs.  He had an LP to exclude meningitis and there was low suspicion for meningitis at this point. His neck was supple. He had recurrent fevers, chest x-ray revealed a possible aspiration pneumonia. Alternatively, he has recently been using IV drugs and is at risk for bacteremia and endocarditis. Patient was started on IV antibiotics for aspiration pneumonia, MRI of the lumbar spine consistent with myositis and rhabdomyolysis, no other infectious etiology identified.  Assessment & Plan:   Toxic encephalopathy secondary to drug use - overall improving, More alert this morning but still not at baseline per his mom who is at bedside - patient still requiring as needed Haldol - Patient also on CIWA protocol for possible benzos withdrawal - No restraints placed this morning, sitter is at bedside  Acute hypoxic respiratory failure - Suspect secondary to hypoventilation during. Of acute intoxication, questionable new aspiration pneumonia, left lower lobe - Still unable to wean off oxygen - Patient has been started on Unasyn IV, continue same regimen day #2 - Provide incentive spirometry while awake  Fevers - T max in the past  24 hours 100.6 F  - Suspect left lower lobe aspiration pneumonia however given patient's high risk for bacteremia and endocarditis, this is still not entirely ruled out - repeat blood cultures obtained 08/15/2017, still pending - Lumbar spine MRI notable for myositis and rhabdomyolysis with no other infectious etiology identified - Repeat blood culture today and again in the morning - Antibiotics as noted above   Back pain  - Secondary to rhabdomyolysis and myositis as confirmed with MRI -  no evidence of osteomyelitis or discitis - Continue Advil and Tylenol as needed  Rhabdomyolysis with elevated LFTs - Continue to provide IV fluids -  monitor LFTs as well as CK levels   History of IVDA -  will discuss further cessation once patient able to protect his to date in meaningful conversation   Hypophosphatemia,  Hypokalemia, hypomagnesemia - Supplement and repeat in the morning   Seizure disorder -  seizure precautions, patient is not on any antiepileptic regimen   TSH low -  free T4 pending   DVT prophylaxis:  lovenox Code Status:  full Family Communication:  mother at bedside  Disposition Plan:  will likely go home once rhabdomyolysis resolved and patient back to his baseline    Consultants:   None   Procedures:   None   Antimicrobials:  Anti-infectives    Start     Dose/Rate Route Frequency Ordered Stop   08/15/17 1800  Ampicillin-Sulbactam (UNASYN) 3 g in sodium chloride 0.9 % 100 mL IVPB     3 g 200 mL/hr over 30 Minutes Intravenous Every 6 hours 08/15/17 1616       Subjective:  More  alert this morning, reports feeling better but still tired and wants to be left alone.   Objective: Vitals:   08/16/17 0328 08/16/17 0400 08/16/17 0600 08/16/17 0800  BP:  (!) 118/51 (!) 131/59 124/71  Pulse: 75 (!) 53 (!) 51 (!) 56  Resp:  (!) 27 (!) 25 (!) 24  Temp:    (!) 100.6 F (38.1 C)  TempSrc:    Oral  SpO2:  96% 97% 99%  Weight:      Height:         Intake/Output Summary (Last 24 hours) at 08/16/17 0906 Last data filed at 08/16/17 0400  Gross per 24 hour  Intake             2725 ml  Output             2825 ml  Net             -100 ml   Filed Weights   08/14/17 1724 08/15/17 0153  Weight: 79.4 kg (175 lb) 75.6 kg (166 lb 10.7 oz)   Physical Exam  Constitutional: Appears tired but alert, able to follow commands, not in acute distress CVS: Bradycardic, S1/S2 +, no murmurs, no gallops, no carotid bruit.  Pulmonary: Effort and breath sounds normal, no stridor, rhonchi, wheezes, rales.  Abdominal: Soft. BS +,  no distension, tenderness, rebound or guarding.  Musculoskeletal: Normal range of motion. Tenderness in lower back area   Data Reviewed: I have personally reviewed following labs and imaging studies  CBC:  Recent Labs Lab 08/14/17 1754 08/15/17 0638 08/16/17 0304  WBC 9.2 7.2 9.2  NEUTROABS 7.7  --   --   HGB 12.4* 10.4* 10.9*  HCT 37.9* 31.7* 32.5*  MCV 91.5 90.3 88.6  PLT 258 184 189   Basic Metabolic Panel:  Recent Labs Lab 08/14/17 1754 08/15/17 0614 08/16/17 0304  NA 142 138 140  K 3.9 3.7 3.3*  CL 106 110 109  CO2 27 21* 25  GLUCOSE 107* 118* 123*  BUN 25* 21* 11  CREATININE 1.24 0.93 0.81  CALCIUM 9.2 7.6* 8.1*  MG  --  1.5* 1.9  PHOS  --  1.8*  --    Liver Function Tests:  Recent Labs Lab 08/14/17 1754 08/15/17 0614 08/16/17 0304  AST 438* 444* 384*  ALT 94* 87* 91*  ALKPHOS 52 39 40  BILITOT 0.8 0.6 0.7  PROT 7.7 5.9* 6.1*  ALBUMIN 4.1 3.0* 3.0*    Recent Labs Lab 08/14/17 2318  AMMONIA 14   Coagulation Profile:  Recent Labs Lab 08/15/17 0029  INR 1.08   Cardiac Enzymes:  Recent Labs Lab 08/14/17 1754 08/15/17 0614 08/16/17 0304  CKTOTAL 23,204* 15,727* 16,150*   Thyroid Function Tests:  Recent Labs  08/15/17 0638 08/16/17 0304  TSH 0.055*  --   FREET4  --  1.03   Urine analysis:    Component Value Date/Time   COLORURINE YELLOW 08/14/2017 1924    APPEARANCEUR CLOUDY (A) 08/14/2017 1924   LABSPEC 1.020 08/14/2017 1924   PHURINE 8.5 (H) 08/14/2017 1924   GLUCOSEU NEGATIVE 08/14/2017 1924   HGBUR LARGE (A) 08/14/2017 1924   BILIRUBINUR SMALL (A) 08/14/2017 1924   KETONESUR 20 (A) 08/14/2017 1924   PROTEINUR 100 (A) 08/14/2017 1924   NITRITE NEGATIVE 08/14/2017 1924   LEUKOCYTESUR NEGATIVE 08/14/2017 1924   Recent Results (from the past 240 hour(s))  CSF culture     Status: None (Preliminary result)   Collection Time: 08/15/17 12:04 AM  Result Value Ref Range Status   Specimen Description CSF  Final   Special Requests Normal  Final   Gram Stain   Final    NO ORGANISMS SEEN NO WBC SEEN CYTOSPIN Gram Stain Report Called to,Read Back By and Verified With: Larina Earthly [phone] @ 0130 BY J SCOTTON    Culture PENDING  Incomplete   Report Status PENDING  Incomplete  MRSA PCR Screening     Status: None   Collection Time: 08/15/17 12:50 PM  Result Value Ref Range Status   MRSA by PCR NEGATIVE NEGATIVE Final    Comment:        The GeneXpert MRSA Assay (FDA approved for NASAL specimens only), is one component of a comprehensive MRSA colonization surveillance program. It is not intended to diagnose MRSA infection nor to guide or monitor treatment for MRSA infections.       Radiology Studies: Dg Ribs Unilateral W/chest Left  Result Date: 08/14/2017 CLINICAL DATA:  Lethargic, left-sided rib pain EXAM: LEFT RIBS AND CHEST - 3+ VIEW COMPARISON:  None. FINDINGS: AP semi-erect view of the chest demonstrates no acute infiltrate or effusion. Normal cardiomediastinal silhouette. No pneumothorax. Bilateral nipple rings. Left rib series demonstrates no acute displaced left rib fracture IMPRESSION: 1. Negative single-view chest 2. No acute displaced left rib fracture. Electronically Signed   By: Jasmine Pang M.D.   On: 08/14/2017 18:51   Ct Head Wo Contrast  Result Date: 08/14/2017 CLINICAL DATA:  Found lying the side of the road.  Altered mental status. EXAM: CT HEAD WITHOUT CONTRAST CT CERVICAL SPINE WITHOUT CONTRAST TECHNIQUE: Multidetector CT imaging of the head and cervical spine was performed following the standard protocol without intravenous contrast. Multiplanar CT image reconstructions of the cervical spine were also generated. COMPARISON:  Head CT 01/14/2017 and 01/20/2017. FINDINGS: CT HEAD FINDINGS Brain: No mass lesion, intraparenchymal hemorrhage or extra-axial collection. No evidence of acute cortical infarct. Brain parenchyma and CSF-containing spaces are normal for age. Vascular: No hyperdense vessel or unexpected calcification. Skull: Normal visualized skull base, calvarium and extracranial soft tissues. Sinuses/Orbits: Partial opacification of the right frontal and anterior right ethmoid sinuses. Complete opacification of the right maxillary sinus. No mastoid or middle ear effusion. Normal orbits. CT CERVICAL SPINE FINDINGS Alignment: No static subluxation. Facets are aligned. Occipital condyles are normally positioned. Skull base and vertebrae: No acute fracture. Soft tissues and spinal canal: No prevertebral fluid or swelling. No visible canal hematoma. Disc levels: No advanced spinal canal or neural foraminal stenosis. Upper chest: No pneumothorax, pulmonary nodule or pleural effusion. Other: Normal visualized paraspinal cervical soft tissues. IMPRESSION: 1. No acute intracranial abnormality. 2. No acute fracture or static subluxation of the cervical spine. 3. Right ostiomeatal complex pattern sinusitis. Electronically Signed   By: Deatra Robinson M.D.   On: 08/14/2017 23:37   Ct Cervical Spine Wo Contrast  Result Date: 08/14/2017 CLINICAL DATA:  Found lying the side of the road. Altered mental status. EXAM: CT HEAD WITHOUT CONTRAST CT CERVICAL SPINE WITHOUT CONTRAST TECHNIQUE: Multidetector CT imaging of the head and cervical spine was performed following the standard protocol without intravenous contrast. Multiplanar  CT image reconstructions of the cervical spine were also generated. COMPARISON:  Head CT 01/14/2017 and 01/20/2017. FINDINGS: CT HEAD FINDINGS Brain: No mass lesion, intraparenchymal hemorrhage or extra-axial collection. No evidence of acute cortical infarct. Brain parenchyma and CSF-containing spaces are normal for age. Vascular: No hyperdense vessel or unexpected calcification. Skull: Normal visualized skull base, calvarium and extracranial soft tissues. Sinuses/Orbits:  Partial opacification of the right frontal and anterior right ethmoid sinuses. Complete opacification of the right maxillary sinus. No mastoid or middle ear effusion. Normal orbits. CT CERVICAL SPINE FINDINGS Alignment: No static subluxation. Facets are aligned. Occipital condyles are normally positioned. Skull base and vertebrae: No acute fracture. Soft tissues and spinal canal: No prevertebral fluid or swelling. No visible canal hematoma. Disc levels: No advanced spinal canal or neural foraminal stenosis. Upper chest: No pneumothorax, pulmonary nodule or pleural effusion. Other: Normal visualized paraspinal cervical soft tissues. IMPRESSION: 1. No acute intracranial abnormality. 2. No acute fracture or static subluxation of the cervical spine. 3. Right ostiomeatal complex pattern sinusitis. Electronically Signed   By: Deatra Robinson M.D.   On: 08/14/2017 23:37   Mr Lumbar Spine W Wo Contrast  Result Date: 08/15/2017 CLINICAL DATA:  26 y/o M; back pain with suspected cancer or infection. EXAM: MRI LUMBAR SPINE WITHOUT AND WITH CONTRAST TECHNIQUE: Multiplanar and multiecho pulse sequences of the lumbar spine were obtained without and with intravenous contrast. CONTRAST:  15mL MULTIHANCE GADOBENATE DIMEGLUMINE 529 MG/ML IV SOLN COMPARISON:  None. FINDINGS: Segmentation:  Standard. Alignment:  Physiologic. Vertebrae: No fracture, evidence of discitis, or bone lesion. No abnormal enhancement. Conus medullaris: Extends to the L1-2 level and appears  normal. No abnormal enhancement of the conus medullaris or cauda equina. Paraspinal and other soft tissues: There is edema and enhancement throughout the left-sided paraspinal muscles and minimally within the left upper psoas muscle. No discrete fluid collection is identified. Disc levels: No significant disc displacement, foraminal narrowing, or canal stenosis. IMPRESSION: 1. Edema and enhancement throughout left-sided paraspinal muscles and minimally within the left upper psoas muscle may represent rhabdomyolysis or myositis. No abscess identified. 2. No abnormality of the lumbar spine identified. 3. No abnormal signal or enhancement of the cauda equina or conus medullaris. Electronically Signed   By: Mitzi Hansen M.D.   On: 08/15/2017 21:27   Dg Chest Port 1 View  Result Date: 08/15/2017 CLINICAL DATA:  Fever.  Altered mental status.  Drug overdose. EXAM: PORTABLE CHEST 1 VIEW COMPARISON:  08/14/2017. FINDINGS: Mild fullness of the upper mediastinum most likely related to AP portable technique and slight lordotic projection. Cardiomegaly. Scratched it mild right mid lung field subsegmental atelectasis. Mild infiltrate left lung base cannot be excluded. No pleural effusion or pneumothorax . IMPRESSION: Mild right mid lung field subsegmental atelectasis. Mild infiltrate left lung base cannot be excluded . Electronically Signed   By: Maisie Fus  Register   On: 08/15/2017 10:06     Scheduled Meds: . folic acid  1 mg Oral Daily  . LORazepam  0-4 mg Intravenous Q6H   Followed by  . [START ON 08/17/2017] LORazepam  0-4 mg Intravenous Q12H  . multivitamin with minerals  1 tablet Oral Daily  . nicotine  21 mg Transdermal Daily  . thiamine injection  100 mg Intravenous Daily   Continuous Infusions: . ampicillin-sulbactam (UNASYN) IV Stopped (08/16/17 0610)  . dextrose 5 % and 0.45 % NaCl with KCl 10 mEq/L 150 mL/hr at 08/16/17 0707     LOS: 1 day    Time spent: 35 minutes   Debbora Presto, MD [facility] Pager [phone]  If 7PM-7AM, please contact night-coverage [email or web address] Password [email or web address] 08/16/2017, 9:06 AM

## 2017-08-17 ENCOUNTER — Encounter (HOSPITAL_COMMUNITY): Payer: Self-pay | Admitting: Internal Medicine

## 2017-08-17 ENCOUNTER — Inpatient Hospital Stay (HOSPITAL_COMMUNITY)
Admission: EM | Admit: 2017-08-17 | Discharge: 2017-08-21 | DRG: 917 | Disposition: A | Discharge status: Home / Self Care | Payer: Self-pay | Attending: Internal Medicine | Admitting: Internal Medicine

## 2017-08-17 DIAGNOSIS — R451 Restlessness and agitation: Secondary | ICD-10-CM | POA: Diagnosis present

## 2017-08-17 DIAGNOSIS — Z79899 Other long term (current) drug therapy: Secondary | ICD-10-CM

## 2017-08-17 DIAGNOSIS — J690 Pneumonitis due to inhalation of food and vomit: Secondary | ICD-10-CM | POA: Diagnosis present

## 2017-08-17 DIAGNOSIS — R4182 Altered mental status, unspecified: Secondary | ICD-10-CM

## 2017-08-17 DIAGNOSIS — B1920 Unspecified viral hepatitis C without hepatic coma: Secondary | ICD-10-CM | POA: Diagnosis present

## 2017-08-17 DIAGNOSIS — F419 Anxiety disorder, unspecified: Secondary | ICD-10-CM | POA: Diagnosis present

## 2017-08-17 DIAGNOSIS — G40909 Epilepsy, unspecified, not intractable, without status epilepticus: Secondary | ICD-10-CM | POA: Diagnosis present

## 2017-08-17 DIAGNOSIS — M609 Myositis, unspecified: Secondary | ICD-10-CM | POA: Diagnosis present

## 2017-08-17 DIAGNOSIS — G92 Toxic encephalopathy: Secondary | ICD-10-CM | POA: Diagnosis present

## 2017-08-17 DIAGNOSIS — F11229 Opioid dependence with intoxication, unspecified: Secondary | ICD-10-CM | POA: Diagnosis present

## 2017-08-17 DIAGNOSIS — M6282 Rhabdomyolysis: Secondary | ICD-10-CM | POA: Diagnosis present

## 2017-08-17 DIAGNOSIS — R945 Abnormal results of liver function studies: Secondary | ICD-10-CM

## 2017-08-17 DIAGNOSIS — F329 Major depressive disorder, single episode, unspecified: Secondary | ICD-10-CM | POA: Diagnosis present

## 2017-08-17 DIAGNOSIS — T401X1A Poisoning by heroin, accidental (unintentional), initial encounter: Principal | ICD-10-CM | POA: Diagnosis present

## 2017-08-17 DIAGNOSIS — G9340 Encephalopathy, unspecified: Secondary | ICD-10-CM | POA: Diagnosis present

## 2017-08-17 DIAGNOSIS — R509 Fever, unspecified: Secondary | ICD-10-CM | POA: Diagnosis present

## 2017-08-17 DIAGNOSIS — Z781 Physical restraint status: Secondary | ICD-10-CM

## 2017-08-17 DIAGNOSIS — F1320 Sedative, hypnotic or anxiolytic dependence, uncomplicated: Secondary | ICD-10-CM | POA: Diagnosis present

## 2017-08-17 DIAGNOSIS — R7989 Other specified abnormal findings of blood chemistry: Secondary | ICD-10-CM | POA: Diagnosis present

## 2017-08-17 DIAGNOSIS — R404 Transient alteration of awareness: Secondary | ICD-10-CM

## 2017-08-17 DIAGNOSIS — R748 Abnormal levels of other serum enzymes: Secondary | ICD-10-CM | POA: Diagnosis present

## 2017-08-17 DIAGNOSIS — F17210 Nicotine dependence, cigarettes, uncomplicated: Secondary | ICD-10-CM | POA: Diagnosis present

## 2017-08-17 LAB — COMPREHENSIVE METABOLIC PANEL
ALBUMIN: 2.8 g/dL — AB (ref 3.5–5.0)
ALBUMIN: 3.1 g/dL — AB (ref 3.5–5.0)
ALT: 87 U/L — ABNORMAL HIGH (ref 17–63)
ALT: 94 U/L — AB (ref 17–63)
ANION GAP: 6 (ref 5–15)
AST: 265 U/L — ABNORMAL HIGH (ref 15–41)
AST: 203 U/L — AB (ref 15–41)
Alkaline Phosphatase: 40 U/L (ref 38–126)
Alkaline Phosphatase: 47 U/L (ref 38–126)
Anion gap: 7 (ref 5–15)
BILIRUBIN TOTAL: 0.8 mg/dL (ref 0.3–1.2)
BILIRUBIN TOTAL: 0.5 mg/dL (ref 0.3–1.2)
BUN: 10 mg/dL (ref 6–20)
BUN: 9 mg/dL (ref 6–20)
CO2: 22 mmol/L (ref 22–32)
CO2: 24 mmol/L (ref 22–32)
CREATININE: 0.88 mg/dL (ref 0.61–1.24)
Calcium: 8.3 mg/dL — ABNORMAL LOW (ref 8.9–10.3)
Calcium: 8.6 mg/dL — ABNORMAL LOW (ref 8.9–10.3)
Chloride: 110 mmol/L (ref 101–111)
Chloride: 108 mmol/L (ref 101–111)
Creatinine, Ser: 0.8 mg/dL (ref 0.61–1.24)
GFR calc Af Amer: >60 mL/min (ref >60)
GFR calc Af Amer: >60 mL/min (ref >60)
GFR calc non Af Amer: >60 mL/min (ref >60)
GLUCOSE: 105 mg/dL — AB (ref 65–99)
GLUCOSE: 86 mg/dL (ref 65–99)
POTASSIUM: 3.5 mmol/L (ref 3.5–5.1)
POTASSIUM: 3.8 mmol/L (ref 3.5–5.1)
Sodium: 138 mmol/L (ref 135–145)
Sodium: 139 mmol/L (ref 135–145)
TOTAL PROTEIN: 5.9 g/dL — AB (ref 6.5–8.1)
TOTAL PROTEIN: 6.5 g/dL (ref 6.5–8.1)

## 2017-08-17 LAB — CBC WITH DIFFERENTIAL/PLATELET
BASOS ABS: 0 10*3/uL (ref 0.0–0.1)
BASOS PCT: 0 %
Eosinophils Absolute: 0 10*3/uL (ref 0.0–0.7)
Eosinophils Relative: 0 %
HEMATOCRIT: 32.4 % — AB (ref 39.0–52.0)
Hemoglobin: 11.1 g/dL — ABNORMAL LOW (ref 13.0–17.0)
LYMPHS PCT: 15 %
Lymphs Abs: 1.2 10*3/uL (ref 0.7–4.0)
MCH: 29.4 pg (ref 26.0–34.0)
MCHC: 34.3 g/dL (ref 30.0–36.0)
MCV: 85.7 fL (ref 78.0–100.0)
Monocytes Absolute: 1.2 10*3/uL — ABNORMAL HIGH (ref 0.1–1.0)
Monocytes Relative: 16 %
NEUTROS ABS: 5.2 10*3/uL (ref 1.7–7.7)
NEUTROS PCT: 69 %
Platelets: 251 10*3/uL (ref 150–400)
RBC: 3.78 MIL/uL — AB (ref 4.22–5.81)
RDW: 12.7 % (ref 11.5–15.5)
WBC: 7.6 10*3/uL (ref 4.0–10.5)

## 2017-08-17 LAB — RAPID URINE DRUG SCREEN, HOSP PERFORMED
AMPHETAMINES: NOT DETECTED
BARBITURATES: NOT DETECTED
BENZODIAZEPINES: POSITIVE — AB
Cocaine: NOT DETECTED
Opiates: POSITIVE — AB
Tetrahydrocannabinol: NOT DETECTED

## 2017-08-17 LAB — ETHANOL

## 2017-08-17 LAB — CBC
HEMATOCRIT: 31.4 % — AB (ref 39.0–52.0)
Hemoglobin: 10.7 g/dL — ABNORMAL LOW (ref 13.0–17.0)
MCH: 29.9 pg (ref 26.0–34.0)
MCHC: 34.1 g/dL (ref 30.0–36.0)
MCV: 87.7 fL (ref 78.0–100.0)
PLATELETS: 209 10*3/uL (ref 150–400)
RBC: 3.58 MIL/uL — ABNORMAL LOW (ref 4.22–5.81)
RDW: 12.7 % (ref 11.5–15.5)
WBC: 7.4 10*3/uL (ref 4.0–10.5)

## 2017-08-17 LAB — I-STAT CG4 LACTIC ACID, ED: Lactic Acid, Venous: 0.53 mmol/L (ref 0.5–1.9)

## 2017-08-17 LAB — CK
CK TOTAL: 8695 U/L — AB (ref 49–397)
CK TOTAL: 5112 U/L — AB (ref 49–397)

## 2017-08-17 LAB — PROCALCITONIN

## 2017-08-17 LAB — PHOSPHORUS: Phosphorus: 1.6 mg/dL — ABNORMAL LOW (ref 2.5–4.6)

## 2017-08-17 LAB — I-STAT TROPONIN, ED: TROPONIN I, POC: 0 ng/mL (ref 0.00–0.08)

## 2017-08-17 LAB — MAGNESIUM: MAGNESIUM: 1.6 mg/dL — AB (ref 1.7–2.4)

## 2017-08-17 MED ORDER — NALOXONE HCL 4 MG/0.1ML NA LIQD
1.0000 | Freq: Once | NASAL | Status: AC
  Administered 2017-08-17: 1 via NASAL
  Filled 2017-08-17: qty 4

## 2017-08-17 MED ORDER — MAGNESIUM SULFATE 2 GM/50ML IV SOLN
2.0000 g | Freq: Once | INTRAVENOUS | Status: AC
  Administered 2017-08-17: 2 g via INTRAVENOUS
  Filled 2017-08-17 (×2): qty 50

## 2017-08-17 MED ORDER — MAGNESIUM OXIDE 400 (241.3 MG) MG PO TABS: 400.0000 mg | ORAL_TABLET | Freq: Every day | ORAL | 0 refills | Status: DC

## 2017-08-17 MED ORDER — SODIUM CHLORIDE 0.9 % IV BOLUS (SEPSIS)
1000.0000 mL | Freq: Once | INTRAVENOUS | Status: AC
  Administered 2017-08-17: 1000 mL via INTRAVENOUS

## 2017-08-17 MED ORDER — AMOXICILLIN-POT CLAVULANATE 875-125 MG PO TABS: 1.0000 | ORAL_TABLET | Freq: Two times a day (BID) | ORAL | 0 refills | Status: DC

## 2017-08-17 MED ORDER — POTASSIUM PHOSPHATE MONOBASIC 500 MG PO TABS: 500.0000 mg | ORAL_TABLET | Freq: Two times a day (BID) | ORAL | 0 refills | Status: DC

## 2017-08-17 MED ORDER — AMOXICILLIN-POT CLAVULANATE 875-125 MG PO TABS
1.0000 | ORAL_TABLET | Freq: Two times a day (BID) | ORAL | Status: DC
  Administered 2017-08-17: 1 via ORAL
  Filled 2017-08-17: qty 1

## 2017-08-17 MED ORDER — POTASSIUM PHOSPHATE MONOBASIC 500 MG PO TABS
500.0000 mg | ORAL_TABLET | Freq: Three times a day (TID) | ORAL | Status: DC
  Filled 2017-08-17: qty 1

## 2017-08-17 MED ORDER — NALOXONE HCL 4 MG/0.1ML NA LIQD
1.0000 | Freq: Once | NASAL | Status: AC
  Administered 2017-08-17: 1 via NASAL

## 2017-08-17 NOTE — Discharge Instructions (Signed)
Fever, Adult A fever is an increase in the body's temperature. It is often defined as a temperature of 100 F (38C) or higher. Short mild or moderate fevers often have no long-term effects. They also often do not need treatment. Moderate or high fevers may make you feel uncomfortable. Sometimes, they can also be a sign of a serious illness or disease. The sweating that may happen with repeated fevers or fevers that last a while may also cause you to not have enough fluid in your body (dehydration). You can take your temperature with a thermometer to see if you have a fever. A measured temperature can change with:  Age.  Time of day.  Where the thermometer is placed: ? Mouth (oral). ? Rectum (rectal). ? Ear (tympanic). ? Underarm (axillary). ? Forehead (temporal).  Follow these instructions at home: Pay attention to any changes in your symptoms. Take these actions to help with your condition:  Take over-the-counter and prescription medicines only as told by your doctor. Follow the dosing instructions carefully.  If you were prescribed an antibiotic medicine, take it as told by your doctor. Do not stop taking the antibiotic even if you start to feel better.  Rest as needed.  Drink enough fluid to keep your pee (urine) clear or pale yellow.  Sponge yourself or bathe with room-temperature water as needed. This helps to lower your body temperature . Do not use ice water.  Do not wear too many blankets or heavy clothes.  Contact a doctor if:  You throw up (vomit).  You cannot eat or drink without throwing up.  You have watery poop (diarrhea).  It hurts when you pee.  Your symptoms do not get better with treatment.  You have new symptoms.  You feel very weak. Get help right away if:  You are short of breath or have trouble breathing.  You are dizzy or you pass out (faint).  You feel confused.  You have signs of not having enough fluid in your body, such as: ? A dry  mouth. ? Peeing less. ? Looking pale.  You have very bad pain in your belly (abdomen).  You keep throwing up or having water poop.  You have a skin rash.  Your symptoms suddenly get worse. This information is not intended to replace advice given to you by your health care provider. Make sure you discuss any questions you have with your health care provider. Document Released: 09/13/2008 Document Revised: 05/12/2016 Document Reviewed: 01/29/2015 Elsevier Interactive Patient Education  2018 [employer]  

## 2017-08-17 NOTE — ED Notes (Signed)
Bed: RESA Expected date:  Expected time:  Means of arrival:  Comments: EMS-possible heroin use-triage

## 2017-08-17 NOTE — Clinical Social Work Note (Addendum)
Clinical Social Work Assessment  Patient Details  Name: Mark Strickland MRN: [id number] Date of Birth: 12-16-1991  Date of referral:  08/17/17               Reason for consult:                   Permission sought to share information with:  Family Supports Permission granted to share information::     Name::        Agency::     Relationship::   Mother   Contact Information:     Housing/Transportation Living arrangements for the past 2 months:  Homeless Source of Information:  Patient Patient Interpreter Needed:  None Criminal Activity/Legal Involvement Pertinent to Current Situation/Hospitalization:  No - Comment as needed Significant Relationships:  Parents Lives with:  Self Do you feel safe going back to the place where you live?  Yes Need for family participation in patient care:  Yes   Care giving concerns:  Substance Abuse Concerns    Social Worker assessment / plan:  CSW met with patient and mother at bedside, explain role and reason for visit. Patient reports using opiates, denies alcohol use.  Patient UDS was positive for cocaine.Patient reports he recently completed a three month residential/ Outpatient program [facility] [place]. The patient reports he has sponsor and goes to outpatient meetings. Patient plans to continue to use there services at discharge. Patient was receptive to the list of additional residential and outpatient facilities.  No other concerns identified. CSW signing off.   Employment status:  Full-Time/Patient also express concerns about discharge date, he is unsure if he will still be employed after missing days of work.  Insurance information:  Self Pay (Medicaid Pending) PT Recommendations:  Not assessed at this time Information / Referral to community resources:  Residential Substance Abuse Treatment Options, Outpatient Psychiatric Care  Patient/Family's Response to care:  Patient waiting to see physician, anxious about leaving.  Appreciative of CSW services.   Patient/Family's Understanding of and Emotional Response to Diagnosis, Current Treatment, and Prognosis: " I took some pills (opiates) this one time since going to treatment and I am here. I have some back pain." Patient anxious about seeing physician.   Emotional Assessment Appearance:  Appears stated age Attitude/Demeanor/Rapport:    Affect (typically observed):  Accepting, Calm Orientation:  Oriented to Self, Oriented to Place, Oriented to  Time, Oriented to Situation Alcohol / Substance use:    Psych involvement (Current and /or in the community):  No   Discharge Needs  Concerns to be addressed:  Substance Abuse Concerns Readmission within the last 30 days:  No Current discharge risk:  None Barriers to Discharge:  Continued Medical Work up, Active Substance Use   Lia Hopping, LCSW 08/17/2017, 1:35 PM

## 2017-08-17 NOTE — ED Notes (Signed)
Attempted to get pt's rectal temp, but pt started kicking at me and said I couldn't get it.

## 2017-08-17 NOTE — Discharge Summary (Signed)
Physician Discharge Summary  Mark Strickland ZOX:[id number] DOB: [birth year] DOA: 08/14/2017  PCP: Patient, No Pcp Per  Admit date: 08/14/2017 Discharge date: 08/17/2017  Recommendations for Outpatient Follow-up:  PT wants to leave today, encourage to stay but declined  1. Pt will need to follow up with PCP in 1 week post discharge 2. Please obtain BMP to evaluate electrolytes and kidney function, mg and phosph levels, CK level  3. Pt wants to leave home today despite recommendation to stay, mother made aware   Discharge Diagnoses:  Active Problems:   Drug overdose, intentional (HCC)   Encephalopathy, toxic   Elevated LFTs   Fever   Polysubstance overdose   Rhabdomyolysis   Discharge Condition: Stable  Diet recommendation: Heart healthy diet discussed in details   History of present illness:  The patient is a 26 year old male with history of IV drug use, particularly heroin and also benzodiazepines and other substances, seizures, depression and anxiety. The patient was found lying on the side of the road with pinpoint pupils, talking nonsensically. In the emergency department, his urine drug screen was positive for opiates and benzodiazepines. He admitted that he had taken something else but could not state what that was.  He had a low-grade fever, was hyperreflexic, pinpoint pupils, urinary retention. He was admitted to the stepdown unit and started on IV fluids.  He was also found to have rhabdomyolysis which likely explains his elevated LFTs.  He had an LP to exclude meningitis and there was low suspicion for meningitis at this point. His neck was supple. He had recurrent fevers, chest x-ray revealed a possible aspiration pneumonia. Alternatively, he has recently been using IV drugs and is at risk for bacteremia and endocarditis. Patient was started on IV antibiotics for aspiration pneumonia, MRI of the lumbar spine consistent with myositis and rhabdomyolysis, no other infectious  etiology identified.  Assessment & Plan:   Toxic encephalopathy secondary to drug use - overall improving, More alert this morning wants to leave AMA - I asked pt and mom to reconsider but mom was in agreement to take son home   Acute hypoxic respiratory failure - Suspect secondary to hypoventilation during. Of acute intoxication, questionable new aspiration pneumonia, left lower lobe - placed on empiric Augmentin as pt wants to leave  Fevers - T max in the past 24 hours 100.6 F  - Suspect left lower lobe aspiration pneumonia however given patient's high risk for bacteremia and endocarditis, this is still not entirely ruled out - repeat blood cultures obtained 08/15/2017, still pending - Lumbar spine MRI notable for myositis and rhabdomyolysis with no other infectious etiology identified - Repeat blood culture pending - Augmentin on discharge as pt wants to leave   Back pain  - Secondary to rhabdomyolysis and myositis as confirmed with MRI -  no evidence of osteomyelitis or discitis  Rhabdomyolysis with elevated LFTs - pt wants IVF stopped  -  monitor LFTs as well as CK levels   History of IVDA -  will discuss further cessation once patient able to protect his to date in meaningful conversation   Hypophosphatemia,  Hypokalemia, hypomagnesemia - supplemented but pt wants to leave so will need outpat monitoring   Seizure disorder -  seizure precautions, patient is not on any antiepileptic regimen   TSH low  Procedures/Studies: Dg Ribs Unilateral W/chest Left  Result Date: 08/14/2017 CLINICAL DATA:  Lethargic, left-sided rib pain EXAM: LEFT RIBS AND CHEST - 3+ VIEW COMPARISON:  None. FINDINGS: AP semi-erect view  of the chest demonstrates no acute infiltrate or effusion. Normal cardiomediastinal silhouette. No pneumothorax. Bilateral nipple rings. Left rib series demonstrates no acute displaced left rib fracture IMPRESSION: 1. Negative single-view chest 2. No acute  displaced left rib fracture. Electronically Signed   By: Jasmine Pang M.D.   On: 08/14/2017 18:51   Ct Head Wo Contrast  Result Date: 08/14/2017 CLINICAL DATA:  Found lying the side of the road. Altered mental status. EXAM: CT HEAD WITHOUT CONTRAST CT CERVICAL SPINE WITHOUT CONTRAST TECHNIQUE: Multidetector CT imaging of the head and cervical spine was performed following the standard protocol without intravenous contrast. Multiplanar CT image reconstructions of the cervical spine were also generated. COMPARISON:  Head CT 01/14/2017 and 01/20/2017. FINDINGS: CT HEAD FINDINGS Brain: No mass lesion, intraparenchymal hemorrhage or extra-axial collection. No evidence of acute cortical infarct. Brain parenchyma and CSF-containing spaces are normal for age. Vascular: No hyperdense vessel or unexpected calcification. Skull: Normal visualized skull base, calvarium and extracranial soft tissues. Sinuses/Orbits: Partial opacification of the right frontal and anterior right ethmoid sinuses. Complete opacification of the right maxillary sinus. No mastoid or middle ear effusion. Normal orbits. CT CERVICAL SPINE FINDINGS Alignment: No static subluxation. Facets are aligned. Occipital condyles are normally positioned. Skull base and vertebrae: No acute fracture. Soft tissues and spinal canal: No prevertebral fluid or swelling. No visible canal hematoma. Disc levels: No advanced spinal canal or neural foraminal stenosis. Upper chest: No pneumothorax, pulmonary nodule or pleural effusion. Other: Normal visualized paraspinal cervical soft tissues. IMPRESSION: 1. No acute intracranial abnormality. 2. No acute fracture or static subluxation of the cervical spine. 3. Right ostiomeatal complex pattern sinusitis. Electronically Signed   By: Deatra Robinson M.D.   On: 08/14/2017 23:37   Ct Cervical Spine Wo Contrast  Result Date: 08/14/2017 CLINICAL DATA:  Found lying the side of the road. Altered mental status. EXAM: CT HEAD  WITHOUT CONTRAST CT CERVICAL SPINE WITHOUT CONTRAST TECHNIQUE: Multidetector CT imaging of the head and cervical spine was performed following the standard protocol without intravenous contrast. Multiplanar CT image reconstructions of the cervical spine were also generated. COMPARISON:  Head CT 01/14/2017 and 01/20/2017. FINDINGS: CT HEAD FINDINGS Brain: No mass lesion, intraparenchymal hemorrhage or extra-axial collection. No evidence of acute cortical infarct. Brain parenchyma and CSF-containing spaces are normal for age. Vascular: No hyperdense vessel or unexpected calcification. Skull: Normal visualized skull base, calvarium and extracranial soft tissues. Sinuses/Orbits: Partial opacification of the right frontal and anterior right ethmoid sinuses. Complete opacification of the right maxillary sinus. No mastoid or middle ear effusion. Normal orbits. CT CERVICAL SPINE FINDINGS Alignment: No static subluxation. Facets are aligned. Occipital condyles are normally positioned. Skull base and vertebrae: No acute fracture. Soft tissues and spinal canal: No prevertebral fluid or swelling. No visible canal hematoma. Disc levels: No advanced spinal canal or neural foraminal stenosis. Upper chest: No pneumothorax, pulmonary nodule or pleural effusion. Other: Normal visualized paraspinal cervical soft tissues. IMPRESSION: 1. No acute intracranial abnormality. 2. No acute fracture or static subluxation of the cervical spine. 3. Right ostiomeatal complex pattern sinusitis. Electronically Signed   By: Deatra Robinson M.D.   On: 08/14/2017 23:37   Mr Lumbar Spine W Wo Contrast  Result Date: 08/15/2017 CLINICAL DATA:  26 y/o M; back pain with suspected cancer or infection. EXAM: MRI LUMBAR SPINE WITHOUT AND WITH CONTRAST TECHNIQUE: Multiplanar and multiecho pulse sequences of the lumbar spine were obtained without and with intravenous contrast. CONTRAST:  15mL MULTIHANCE GADOBENATE DIMEGLUMINE 529 MG/ML IV SOLN  COMPARISON:   None. FINDINGS: Segmentation:  Standard. Alignment:  Physiologic. Vertebrae: No fracture, evidence of discitis, or bone lesion. No abnormal enhancement. Conus medullaris: Extends to the L1-2 level and appears normal. No abnormal enhancement of the conus medullaris or cauda equina. Paraspinal and other soft tissues: There is edema and enhancement throughout the left-sided paraspinal muscles and minimally within the left upper psoas muscle. No discrete fluid collection is identified. Disc levels: No significant disc displacement, foraminal narrowing, or canal stenosis. IMPRESSION: 1. Edema and enhancement throughout left-sided paraspinal muscles and minimally within the left upper psoas muscle may represent rhabdomyolysis or myositis. No abscess identified. 2. No abnormality of the lumbar spine identified. 3. No abnormal signal or enhancement of the cauda equina or conus medullaris. Electronically Signed   By: Mitzi Hansen M.D.   On: 08/15/2017 21:27   Dg Chest Port 1 View  Result Date: 08/15/2017 CLINICAL DATA:  Fever.  Altered mental status.  Drug overdose. EXAM: PORTABLE CHEST 1 VIEW COMPARISON:  08/14/2017. FINDINGS: Mild fullness of the upper mediastinum most likely related to AP portable technique and slight lordotic projection. Cardiomegaly. Scratched it mild right mid lung field subsegmental atelectasis. Mild infiltrate left lung base cannot be excluded. No pleural effusion or pneumothorax . IMPRESSION: Mild right mid lung field subsegmental atelectasis. Mild infiltrate left lung base cannot be excluded . Electronically Signed   By: Maisie Fus  Register   On: 08/15/2017 10:06     \\  Discharge Exam: Vitals:   08/17/17 0502 08/17/17 1121  BP: 127/74 136/77  Pulse: (!) 50 (!) 51  Resp: 16 16  Temp: 99.4 F (37.4 C) 98.8 F (37.1 C)  SpO2: 100% 100%   Vitals:   08/16/17 2129 08/17/17 0217 08/17/17 0502 08/17/17 1121  BP: 132/66 (!) 129/99 127/74 136/77  Pulse: (!) 59 (!) 52 (!) 50  (!) 51  Resp: 18 18 16 16   Temp: (!) 100.8 F (38.2 C) 99.2 F (37.3 C) 99.4 F (37.4 C) 98.8 F (37.1 C)  TempSrc: Oral Oral Oral Oral  SpO2: 100% 100% 100% 100%  Weight:      Height:        General: Pt is declined exam   Discharge Instructions  Discharge Instructions    Diet - low sodium heart healthy    Complete by:  As directed    Increase activity slowly    Complete by:  As directed      Allergies as of 08/17/2017   No Known Allergies     Medication List    STOP taking these medications   acetaminophen 500 MG tablet Commonly known as:  TYLENOL   butalbital-acetaminophen-caffeine 50-325-40 MG tablet Commonly known as:  FIORICET, ESGIC   methocarbamol 500 MG tablet Commonly known as:  ROBAXIN     TAKE these medications   amoxicillin-clavulanate 875-125 MG tablet Commonly known as:  AUGMENTIN Take 1 tablet by mouth every 12 (twelve) hours.   ibuprofen 200 MG tablet Commonly known as:  ADVIL,MOTRIN Take 400-600 mg by mouth every 6 (six) hours as needed for moderate pain.   magnesium oxide 400 (241.3 Mg) MG tablet Commonly known as:  MAG-OX Take 1 tablet (400 mg total) by mouth daily.   potassium phosphate (monobasic) 500 MG tablet Commonly known as:  K-PHOS ORIGINAL Take 1 tablet (500 mg total) by mouth 2 (two) times daily with a meal.            Discharge Care Instructions  Start     Ordered   08/17/17 0000  amoxicillin-clavulanate (AUGMENTIN) 875-125 MG tablet  Every 12 hours     08/17/17 1310   08/17/17 0000  potassium phosphate, monobasic, (K-PHOS ORIGINAL) 500 MG tablet  2 times daily with meals    Comments:  Take for 4 days   08/17/17 1310   08/17/17 0000  magnesium oxide (MAG-OX) 400 (241.3 Mg) MG tablet  Daily     08/17/17 1310   08/17/17 0000  Increase activity slowly     08/17/17 1310   08/17/17 0000  Diet - low sodium heart healthy     08/17/17 1310     Follow-up Information    Dorothea Ogle, MD. Call.   Specialty:   Internal Medicine Why:  [phone] my cell, call me with any questions  Contact information: [address] [place] [address] [phone]            The results of significant diagnostics from this hospitalization (including imaging, microbiology, ancillary and laboratory) are listed below for reference.     Microbiology: Recent Results (from the past 240 hour(s))  CSF culture     Status: None (Preliminary result)   Collection Time: 08/15/17 12:04 AM  Result Value Ref Range Status   Specimen Description CSF  Final   Special Requests Normal  Final   Gram Stain   Final    NO ORGANISMS SEEN NO WBC SEEN CYTOSPIN Gram Stain Report Called to,Read Back By and Verified With: Larina Earthly [phone] @ 0130 BY J SCOTTON    Culture   Final    NO GROWTH 2 DAYS Performed at [facility], [address] [place], [place] [address]    Report Status PENDING  Incomplete  Culture, blood (Routine X 2) w Reflex to ID Panel     Status: None (Preliminary result)   Collection Time: 08/15/17 12:20 AM  Result Value Ref Range Status   Specimen Description BLOOD RIGHT HAND  Final   Special Requests   Final    BOTTLES DRAWN AEROBIC AND ANAEROBIC Blood Culture adequate volume   Culture   Final    NO GROWTH 2 DAYS Performed at [facility], [address] [place], [place] [address]    Report Status PENDING  Incomplete  Culture, blood (Routine X 2) w Reflex to ID Panel     Status: None (Preliminary result)   Collection Time: 08/15/17 12:32 AM  Result Value Ref Range Status   Specimen Description BLOOD LEFT HAND  Final   Special Requests   Final    BOTTLES DRAWN AEROBIC AND ANAEROBIC Blood Culture adequate volume   Culture   Final    NO GROWTH 2 DAYS Performed at  [facility], [address] [place], [place] [address]    Report Status PENDING  Incomplete  MRSA PCR Screening     Status: None   Collection Time: 08/15/17 12:50 PM  Result Value Ref  Range Status   MRSA by PCR NEGATIVE NEGATIVE Final    Comment:        The GeneXpert MRSA Assay (FDA approved for NASAL specimens only), is one component of a comprehensive MRSA colonization surveillance program. It is not intended to diagnose MRSA infection nor to guide or monitor treatment for MRSA infections.   Culture, blood (single) w Reflex to ID Panel     Status: None (Preliminary result)   Collection Time: 08/15/17  4:39 PM  Result  Value Ref Range Status   Specimen Description BLOOD LEFT HAND  Final   Special Requests   Final    BOTTLES DRAWN AEROBIC ONLY Blood Culture adequate volume   Culture   Final    NO GROWTH 2 DAYS Performed at [facility], [address], [place], [place] [address]    Report Status PENDING  Incomplete  Culture, blood (single) w Reflex to ID Panel     Status: None (Preliminary result)   Collection Time: 08/16/17  3:04 AM  Result Value Ref Range Status   Specimen Description BLOOD RIGHT ANTECUBITAL  Final   Special Requests   Final    BOTTLES DRAWN AEROBIC ONLY Blood Culture adequate volume   Culture   Final    NO GROWTH 1 DAY Performed at [facility], [address] [place], [place] [address]    Report Status PENDING  Incomplete  Culture, blood (single) w Reflex to ID Panel     Status: None (Preliminary result)   Collection Time: 08/16/17  3:04 AM  Result Value Ref Range Status   Specimen Description BLOOD LEFT HAND  Final   Special Requests IN PEDIATRIC BOTTLE Blood Culture adequate volume  Final   Culture   Final    NO GROWTH 1 DAY Performed at [facility], [address], [place], [place] [address]    Report Status PENDING  Incomplete     Labs: Basic Metabolic Panel:  Recent Labs Lab 08/14/17 1754 08/15/17 0614 08/16/17 0304 08/17/17 0514  NA 142 138 140 138  K 3.9 3.7 3.3* 3.5  CL 106 110 109 110  CO2 27 21* 25 22  GLUCOSE 107* 118* 123* 105*  BUN 25* 21* 11 10  CREATININE 1.24 0.93 0.81 0.80   CALCIUM 9.2 7.6* 8.1* 8.3*  MG  --  1.5* 1.9 1.6*  PHOS  --  1.8*  --  1.6*   Liver Function Tests:  Recent Labs Lab 08/14/17 1754 08/15/17 0614 08/16/17 0304 08/17/17 0514  AST 438* 444* 384* 265*  ALT 94* 87* 91* 87*  ALKPHOS 52 39 40 40  BILITOT 0.8 0.6 0.7 0.8  PROT 7.7 5.9* 6.1* 5.9*  ALBUMIN 4.1 3.0* 3.0* 2.8*   No results for input(s): LIPASE, AMYLASE in the last 168 hours.  Recent Labs Lab 08/14/17 2318  AMMONIA 14   CBC:  Recent Labs Lab 08/14/17 1754 08/15/17 0638 08/16/17 0304 08/17/17 0514  WBC 9.2 7.2 9.2 7.4  NEUTROABS 7.7  --   --   --   HGB 12.4* 10.4* 10.9* 10.7*  HCT 37.9* 31.7* 32.5* 31.4*  MCV 91.5 90.3 88.6 87.7  PLT 258 184 189 209   Cardiac Enzymes:  Recent Labs Lab 08/14/17 1754 08/15/17 0614 08/16/17 0304 08/17/17 0514  CKTOTAL 23,204* 15,727* 16,150* 9,147*   BNP: BNP (last 3 results) No results for input(s): BNP in the last 8760 hours.  ProBNP (last 3 results) No results for input(s): PROBNP in the last 8760 hours.  CBG: No results for input(s): GLUCAP in the last 168 hours.   SIGNED: Time coordinating discharge: 60 minutes  Debbora Presto, MD  [facility] 08/17/2017, 1:10 PM Pager [phone]  If 7PM-7AM, please contact night-coverage [email or web address] Password [email or web address]

## 2017-08-17 NOTE — Progress Notes (Signed)
Pt alert, oriented and ambulatory.  Pt was given d/c instructions and prescription. Instructions explained, all questions answered.  Pt accompanied by mother. No complications noted.

## 2017-08-17 NOTE — Progress Notes (Addendum)
Pt's IV infiltrated; unsure whether IV Ativan was infused in vein. Two more mg of  IV Ativan ordered.  Pt requested to sleep and not be awakened for vitals during night

## 2017-08-18 ENCOUNTER — Encounter (HOSPITAL_COMMUNITY): Payer: Self-pay | Admitting: Radiology

## 2017-08-18 ENCOUNTER — Inpatient Hospital Stay (HOSPITAL_COMMUNITY): Payer: Self-pay

## 2017-08-18 DIAGNOSIS — F1314 Sedative, hypnotic or anxiolytic abuse with sedative, hypnotic or anxiolytic-induced mood disorder: Secondary | ICD-10-CM

## 2017-08-18 DIAGNOSIS — R502 Drug induced fever: Secondary | ICD-10-CM

## 2017-08-18 DIAGNOSIS — R509 Fever, unspecified: Secondary | ICD-10-CM

## 2017-08-18 DIAGNOSIS — R4182 Altered mental status, unspecified: Secondary | ICD-10-CM

## 2017-08-18 DIAGNOSIS — F1114 Opioid abuse with opioid-induced mood disorder: Secondary | ICD-10-CM

## 2017-08-18 DIAGNOSIS — Z811 Family history of alcohol abuse and dependence: Secondary | ICD-10-CM

## 2017-08-18 DIAGNOSIS — R400 Somnolence: Secondary | ICD-10-CM

## 2017-08-18 DIAGNOSIS — G9340 Encephalopathy, unspecified: Secondary | ICD-10-CM | POA: Diagnosis present

## 2017-08-18 DIAGNOSIS — R7989 Other specified abnormal findings of blood chemistry: Secondary | ICD-10-CM

## 2017-08-18 DIAGNOSIS — F17210 Nicotine dependence, cigarettes, uncomplicated: Secondary | ICD-10-CM

## 2017-08-18 DIAGNOSIS — M6282 Rhabdomyolysis: Secondary | ICD-10-CM

## 2017-08-18 LAB — CSF CULTURE
GRAM STAIN: NONE SEEN
SPECIAL REQUESTS: NORMAL

## 2017-08-18 LAB — I-STAT CG4 LACTIC ACID, ED: Lactic Acid, Venous: 0.61 mmol/L (ref 0.5–1.9)

## 2017-08-18 LAB — CSF CULTURE W GRAM STAIN: Culture: NO GROWTH

## 2017-08-18 LAB — STREP PNEUMONIAE URINARY ANTIGEN: Strep Pneumo Urinary Antigen: NEGATIVE

## 2017-08-18 LAB — RPR: RPR Ser Ql: NONREACTIVE

## 2017-08-18 LAB — VITAMIN B12: VITAMIN B 12: 1312 pg/mL — AB (ref 180–914)

## 2017-08-18 LAB — TSH: TSH: 0.32 u[IU]/mL — AB (ref 0.350–4.500)

## 2017-08-18 LAB — ACETAMINOPHEN LEVEL

## 2017-08-18 LAB — SEDIMENTATION RATE: SED RATE: 61 mm/h — AB (ref 0–16)

## 2017-08-18 LAB — AMMONIA: Ammonia: 25 umol/L (ref 9–35)

## 2017-08-18 MED ORDER — MAGNESIUM SULFATE 2 GM/50ML IV SOLN
2.0000 g | Freq: Once | INTRAVENOUS | Status: AC
  Administered 2017-08-18: 2 g via INTRAVENOUS
  Filled 2017-08-18: qty 50

## 2017-08-18 MED ORDER — DEXTROSE 5 % IV SOLN
1.0000 g | Freq: Three times a day (TID) | INTRAVENOUS | Status: DC
  Administered 2017-08-18 – 2017-08-19 (×3): 1 g via INTRAVENOUS
  Filled 2017-08-18 (×3): qty 1

## 2017-08-18 MED ORDER — DEXTROSE 5 % IV SOLN
2.0000 g | Freq: Once | INTRAVENOUS | Status: AC
  Administered 2017-08-18: 2 g via INTRAVENOUS
  Filled 2017-08-18: qty 2

## 2017-08-18 MED ORDER — ENOXAPARIN SODIUM 40 MG/0.4ML ~~LOC~~ SOLN
40.0000 mg | Freq: Every day | SUBCUTANEOUS | Status: DC
  Administered 2017-08-18 – 2017-08-20 (×4): 40 mg via SUBCUTANEOUS
  Filled 2017-08-18 (×5): qty 0.4

## 2017-08-18 MED ORDER — DEXTROSE 5 % IV SOLN
2.0000 g | Freq: Three times a day (TID) | INTRAVENOUS | Status: DC
  Administered 2017-08-18: 2 g via INTRAVENOUS
  Filled 2017-08-18: qty 2

## 2017-08-18 MED ORDER — VANCOMYCIN HCL IN DEXTROSE 1-5 GM/200ML-% IV SOLN
1000.0000 mg | Freq: Three times a day (TID) | INTRAVENOUS | Status: DC
  Administered 2017-08-18: 1000 mg via INTRAVENOUS
  Filled 2017-08-18 (×2): qty 200

## 2017-08-18 MED ORDER — LORAZEPAM 2 MG/ML IJ SOLN
2.0000 mg | INTRAMUSCULAR | Status: DC | PRN
  Administered 2017-08-18 – 2017-08-19 (×5): 2 mg via INTRAVENOUS
  Administered 2017-08-19: 3 mg via INTRAVENOUS
  Administered 2017-08-19 – 2017-08-20 (×3): 2 mg via INTRAVENOUS
  Administered 2017-08-20 – 2017-08-21 (×5): 3 mg via INTRAVENOUS
  Administered 2017-08-21: 2 mg via INTRAVENOUS
  Administered 2017-08-21: 3 mg via INTRAVENOUS
  Administered 2017-08-21: 2 mg via INTRAVENOUS
  Administered 2017-08-21: 3 mg via INTRAVENOUS
  Filled 2017-08-18 (×2): qty 1
  Filled 2017-08-18: qty 2
  Filled 2017-08-18: qty 1
  Filled 2017-08-18: qty 2
  Filled 2017-08-18 (×4): qty 1
  Filled 2017-08-18 (×2): qty 2
  Filled 2017-08-18: qty 1
  Filled 2017-08-18 (×2): qty 2
  Filled 2017-08-18: qty 1
  Filled 2017-08-18: qty 2
  Filled 2017-08-18: qty 1
  Filled 2017-08-18: qty 2

## 2017-08-18 MED ORDER — NICOTINE 21 MG/24HR TD PT24
21.0000 mg | MEDICATED_PATCH | Freq: Every day | TRANSDERMAL | Status: DC
  Administered 2017-08-18 – 2017-08-21 (×4): 21 mg via TRANSDERMAL
  Filled 2017-08-18 (×4): qty 1

## 2017-08-18 MED ORDER — DIAZEPAM 5 MG/ML IJ SOLN
10.0000 mg | Freq: Once | INTRAMUSCULAR | Status: AC
  Administered 2017-08-18: 10 mg via INTRAMUSCULAR
  Filled 2017-08-18: qty 2

## 2017-08-18 MED ORDER — SODIUM CHLORIDE 0.9 % IV SOLN
INTRAVENOUS | Status: AC
  Administered 2017-08-18 (×3): via INTRAVENOUS

## 2017-08-18 MED ORDER — ACETAMINOPHEN 650 MG RE SUPP: 650.0000 mg | Freq: Four times a day (QID) | RECTAL | Status: DC | PRN

## 2017-08-18 MED ORDER — VANCOMYCIN HCL IN DEXTROSE 1-5 GM/200ML-% IV SOLN
1000.0000 mg | Freq: Three times a day (TID) | INTRAVENOUS | Status: DC
  Administered 2017-08-18 – 2017-08-19 (×2): 1000 mg via INTRAVENOUS
  Filled 2017-08-18 (×2): qty 200

## 2017-08-18 MED ORDER — THIAMINE HCL 100 MG/ML IJ SOLN
100.0000 mg | Freq: Every day | INTRAMUSCULAR | Status: DC
  Administered 2017-08-18: 100 mg via INTRAVENOUS
  Filled 2017-08-18: qty 2

## 2017-08-18 MED ORDER — ACETAMINOPHEN 325 MG PO TABS
650.0000 mg | ORAL_TABLET | Freq: Four times a day (QID) | ORAL | Status: DC | PRN
  Administered 2017-08-18 – 2017-08-21 (×3): 650 mg via ORAL
  Filled 2017-08-18 (×3): qty 2

## 2017-08-18 MED ORDER — ENOXAPARIN SODIUM 40 MG/0.4ML ~~LOC~~ SOLN
40.0000 mg | SUBCUTANEOUS | Status: DC
  Filled 2017-08-18: qty 0.4

## 2017-08-18 MED ORDER — VANCOMYCIN HCL IN DEXTROSE 1-5 GM/200ML-% IV SOLN
1000.0000 mg | Freq: Once | INTRAVENOUS | Status: AC
  Administered 2017-08-18: 1000 mg via INTRAVENOUS
  Filled 2017-08-18: qty 200

## 2017-08-18 MED ORDER — FOLIC ACID 5 MG/ML IJ SOLN
1.0000 mg | Freq: Every day | INTRAMUSCULAR | Status: DC
  Administered 2017-08-18: 1 mg via INTRAVENOUS
  Filled 2017-08-18 (×3): qty 0.2

## 2017-08-18 NOTE — H&P (Addendum)
TRH H&P   Patient Demographics:    Mark Strickland, is a 26 y.o. male  MRN: [id number]   DOB - [birth year]  Admit Date - 08/17/2017  Outpatient Primary MD for the patient is Patient, No Pcp Per  Referring MD/NP/PA: Dan Europe  Outpatient Specialists:    Patient coming from: home ?  No chief complaint on file. AMS   HPI:    Mark Strickland  is a 26 y.o. male, w hx of seizure, opioid dep /heroin dep apparently presented to ED brought in by EMS for possible heroin use.  Pt given Narcan in ED,  Found to be febrile, low grade. Pt will be admitted for evlauation of AMS.   In ED, Wbc 7.6, Hgb 11.1, Plt 251,  Bun 9, creatinine 0.88, Magnesium 1.6, Ast 265, Alt 87, Alk phos 40, T. Bili 0.8, Alb 2.8,  Pt will be admitted for AMS    Review of systems:    In addition to the HPI above, unable to obtain due to AMS  With Past History of the following :    Past Medical History:  Diagnosis Date  . Anxiety   . Depressed   . Opiate addiction ([place])   . Seizures ([place])    childhood      History reviewed. No pertinent surgical history.    Social History:     Social History  Substance Use Topics  . Smoking status: Current Every Day Smoker    Packs/day: 1.00    Types: Cigarettes  . Smokeless tobacco: Never Used  . Alcohol use Yes       Family History :     Family History  Problem Relation Age of Onset  . Family history unknown: Yes   Unable to obtain due to AMS   Home Medications:   Prior to Admission medications   Medication Sig Start Date End Date Taking? Authorizing Provider  amoxicillin-clavulanate (AUGMENTIN) 875-125 MG tablet Take 1 tablet by mouth every 12 (twelve) hours. 08/17/17   Theodis Blaze, MD  ibuprofen (ADVIL,MOTRIN) 200 MG tablet Take 400-600 mg by mouth every 6 (six) hours as needed for moderate pain.    [provider]  magnesium oxide  (MAG-OX) 400 (241.3 Mg) MG tablet Take 1 tablet (400 mg total) by mouth daily. 08/17/17   Theodis Blaze, MD  potassium phosphate, monobasic, (K-PHOS ORIGINAL) 500 MG tablet Take 1 tablet (500 mg total) by mouth 2 (two) times daily with a meal. 08/17/17   Theodis Blaze, MD     Allergies:    No Known Allergies   Physical Exam:   Vitals  Blood pressure 127/76, pulse (!) 57, temperature 97.7 F (36.5 C), temperature source Axillary, resp. rate 14, SpO2 96 %.   1. General  lying in bed in NAD,   2.somnolent, responds to painful stimuli  3. No F.N deficits, ALL C.Nerves Intact, Strength  5/5 all 4 extremities, Sensation intact all 4 extremities, Plantars down going.  4. Ears and Eyes appear Normal, Conjunctivae clear, PERRLA. Moist Oral Mucosa.  5. Supple Neck, No JVD, No cervical lymphadenopathy appriciated, No Carotid Bruits.  6. Symmetrical Chest wall movement, Good air movement bilaterally, slight crackle right lung base, no wheeze  7. RRR, No Gallops, Rubs or Murmurs, No Parasternal Heave.  8. Positive Bowel Sounds, Abdomen Soft, No tenderness, No organomegaly appriciated,No rebound -guarding or rigidity.  9.  No Cyanosis, Normal Skin Turgor, No Skin Rash or Bruise.  10. Good muscle tone,  joints appear normal , no effusions, Normal ROM.  11. No Palpable Lymph Nodes in Neck or Axillae     Data Review:    CBC  Recent Labs Lab 08/14/17 1754 08/15/17 8588 08/16/17 0304 08/17/17 0514 08/17/17 1926  WBC 9.2 7.2 9.2 7.4 7.6  HGB 12.4* 10.4* 10.9* 10.7* 11.1*  HCT 37.9* 31.7* 32.5* 31.4* 32.4*  PLT 258 184 189 209 251  MCV 91.5 90.3 88.6 87.7 85.7  MCH 30.0 29.6 29.7 29.9 29.4  MCHC 32.7 32.8 33.5 34.1 34.3  RDW 13.2 13.0 12.8 12.7 12.7  LYMPHSABS 0.6*  --   --   --  1.2  MONOABS 0.9  --   --   --  1.2*  EOSABS 0.0  --   --   --  0.0  BASOSABS 0.0  --   --   --  0.0    ------------------------------------------------------------------------------------------------------------------  Chemistries   Recent Labs Lab 08/14/17 1754 08/15/17 0614 08/16/17 0304 08/17/17 0514 08/17/17 1926  NA 142 138 140 138 139  K 3.9 3.7 3.3* 3.5 3.8  CL 106 110 109 110 108  CO2 27 21* _0 GLUCOSE 107* 118* 123* 105* 86  BUN 25* 21* _1 CREATININE 1.24 0.93 0.81 0.80 0.88  CALCIUM 9.2 7.6* 8.1* 8.3* 8.6*  MG  --  1.5* 1.9 1.6*  --   AST 438* 444* 384* 265* 203*  ALT 94* 87* 91* 87* 94*  ALKPHOS 52 39 40 40 47  BILITOT 0.8 0.6 0.7 0.8 0.5   ------------------------------------------------------------------------------------------------------------------ estimated creatinine clearance is 134.3 mL/min (by C-G formula based on SCr of 0.88 mg/dL). ------------------------------------------------------------------------------------------------------------------  Recent Labs  08/15/17 0638  TSH 0.055*    Coagulation profile  Recent Labs Lab 08/15/17 0029  INR 1.08   ------------------------------------------------------------------------------------------------------------------- No results for input(s): DDIMER in the last 72 hours. -------------------------------------------------------------------------------------------------------------------  Cardiac Enzymes No results for input(s): CKMB, TROPONINI, MYOGLOBIN in the last 168 hours.  Invalid input(s): CK ------------------------------------------------------------------------------------------------------------------ No results found for: BNP   ---------------------------------------------------------------------------------------------------------------  Urinalysis    Component Value Date/Time   COLORURINE YELLOW 08/14/2017 1924   APPEARANCEUR CLOUDY (A) 08/14/2017 1924   LABSPEC 1.020 08/14/2017 1924   PHURINE 8.5 (H) 08/14/2017 1924   GLUCOSEU NEGATIVE 08/14/2017 1924   HGBUR  LARGE (A) 08/14/2017 1924   BILIRUBINUR SMALL (A) 08/14/2017 1924   KETONESUR 20 (A) 08/14/2017 1924   PROTEINUR 100 (A) 08/14/2017 1924   NITRITE NEGATIVE 08/14/2017 1924   LEUKOCYTESUR NEGATIVE 08/14/2017 1924    ----------------------------------------------------------------------------------------------------------------   Imaging Results:    No results found.   Assessment & Plan:    Principal Problem:   Altered mental status Active Problems:   Elevated LFTs   Fever   Rhabdomyolysis    AMS probably due to heroin? Use ddx hepatic encephalopathy Check UDS Check b12, folate, esr, ana, rpr, tsh Check ammonia Check CT brain  Abnormal lft Secondary to etoh, hep c, rhabdo Check hep c rna, genotype Check tylenol level ciwa  Fever ? hcap Check blood culture x2, check esr vanco iv pharmacy to dose Cefepime iv pharmacy to dose  Rhabdo Trend cpk Hydrate with ns iv     DVT Prophylaxis Heparin  SCDs   AM Labs Ordered, also please review Full Orders  Family Communication: Admission, patients condition and plan of care including tests being ordered have been discussed with the patient who indicate understanding and agree with the plan and Code Status.  Code Status FULL CODE  Likely DC to  home  Condition GUARDED    Consults called: none  Admission status: inpatient   Time spent in minutes : 45 minutes   Jani Gravel M.D on 08/18/2017 at 12:03 AM  Between 7am to 7pm - Pager - [phone]. After 7pm go to [email or web address] - password [facility]  [facility] - Office  [phone]

## 2017-08-18 NOTE — Progress Notes (Signed)
Pharmacy Antibiotic Note  Mark Strickland is a 26 y.o. male admitted on 08/17/2017 with pneumonia.  Pharmacy has been consulted for Vancomycin, cefepime dosing.  Plan: Vancomycin 1gm IV every 8 hours.  Goal trough 15-20 mcg/mL.  Cefepime 2gm iv q8hr     Temp (24hrs), Avg:99.1 F (37.3 C), Min:97.7 F (36.5 C), Max:100.4 F (38 C)   Recent Labs Lab 08/14/17 1754 08/15/17 0029 08/15/17 [id number] 08/15/17 [id number] 08/16/17 0304 08/17/17 0514 08/17/17 1926 08/17/17 2049  WBC 9.2  --   --  7.2 9.2 7.4 7.6  --   CREATININE 1.24  --  0.93  --  0.81 0.80 0.88  --   LATICACIDVEN  --  1.1  --   --   --   --   --  0.53    Estimated Creatinine Clearance: 134.3 mL/min (by C-G formula based on SCr of 0.88 mg/dL).    No Known Allergies  Antimicrobials this admission: Vancomycin 08/18/2017 >> Cefepime 08/18/2017 >>   Dose adjustments this admission: -  Microbiology results: pending  Thank you for allowing pharmacy to be a part of this patient's care.  Mark Strickland, Mark Strickland 08/18/2017 1:12 AM

## 2017-08-18 NOTE — Progress Notes (Signed)
Patient seen and examined  26 year old male with history of IV drug use, particularly heroin and also benzodiazepines and other substances, seizures, depression and anxiety. The patient was found lying on the side of the road with pinpoint pupils, talking nonsensically. In the emergency department, his urine drug screen was positive for opiates and benzodiazepines. Patient admitted 8/27 and discharged on 8/30. Ruled out for meningitis. Chest x-ray showed possible aspiration pneumonia. Patient also had a lumbar MRI that showed myositis, no osteomyelitis. Blood cultures remain negative. Patient was  readmitted 8/30 for recurrent use of heroin, decreased responsiveness after being discharged from the hospital 4 hours ago  Plan Continue treatment for aspiration pneumonia Social work consult for placement Psychiatric consult to determine capacity called in 8/31  Continue IV fluids due to elevated CK [abnormal liver function likely secondary to elevated CK]

## 2017-08-18 NOTE — ED Provider Notes (Signed)
[facility]-EMERGENCY DEPT Provider Note   CSN: [id number] Arrival date & time: 08/17/17  1914     History   Chief Complaint No chief complaint on file.   HPI Mark Strickland is a 26 y.o. male.  HPI  Mark Strickland is a 26 y.o. male  With history of polysubstance abuse, presents toemergency department by EMS, from home. Initially police was called for domestic dispute to the house. When they arrived there, they found patient to be altered, barely responding. Patient was just discharged from the hospital for similar presentation 4 hours ago. His altered mental status resolved while in the hospital, and was thought to be due to a drug.patient was found to have elevated LFTs and elevated CK level on his recent evaluation. Blood cultures are negative, lumbar puncture performed and was negative as well. Patient is unable to provide any history, however he does space up and talks to me.   Past Medical History:  Diagnosis Date  . Anxiety   . Depressed   . Opiate addiction ([facility])   . Seizures ([facility])    childhood    Patient Active Problem List   Diagnosis Date Noted  . Altered mental status   . Polysubstance overdose 08/15/2017  . Rhabdomyolysis 08/15/2017  . Elevated LFTs 08/14/2017  . Fever 08/14/2017  . Drug overdose, intentional ([facility]) 12/03/2015  . Encephalopathy, toxic 12/03/2015    History reviewed. No pertinent surgical history.     Home Medications    Prior to Admission medications   Medication Sig Start Date End Date Taking? Authorizing Provider  amoxicillin-clavulanate (AUGMENTIN) 875-125 MG tablet Take 1 tablet by mouth every 12 (twelve) hours. 08/17/17   Dorothea Ogle, MD  ibuprofen (ADVIL,MOTRIN) 200 MG tablet Take 400-600 mg by mouth every 6 (six) hours as needed for moderate pain.    [provider]  magnesium oxide (MAG-OX) 400 (241.3 Mg) MG tablet Take 1 tablet (400 mg total) by mouth daily. 08/17/17   Dorothea Ogle, MD  potassium phosphate, monobasic,  (K-PHOS ORIGINAL) 500 MG tablet Take 1 tablet (500 mg total) by mouth 2 (two) times daily with a meal. 08/17/17   Dorothea Ogle, MD    Family History Family History  Problem Relation Age of Onset  . Family history unknown: Yes    Social History Social History  Substance Use Topics  . Smoking status: Current Every Day Smoker    Packs/day: 1.00    Types: Cigarettes  . Smokeless tobacco: Never Used  . Alcohol use Yes     Allergies   Patient has no known allergies.   Review of Systems Review of Systems  Unable to perform ROS: Mental status change     Physical Exam Updated Vital Signs BP 132/87   Pulse (!) 55   Temp 97.7 F (36.5 C) (Axillary)   Resp 14   SpO2 96%   Physical Exam  Constitutional: He appears well-developed and well-nourished.  somnolent  HENT:  Head: Normocephalic and atraumatic.  Eyes: Pupils are equal, round, and reactive to light. Conjunctivae are normal.  2 mm, equal bilaterally  Neck: Neck supple.  Cardiovascular: Normal rate, regular rhythm and normal heart sounds.   Pulmonary/Chest: Effort normal. No respiratory distress. He has no wheezes. He has no rales.  Abdominal: Soft. Bowel sounds are normal. He exhibits no distension. There is no tenderness. There is no rebound.  Musculoskeletal: He exhibits no edema.  Neurological: He is alert.  Oriented 2, to self and time. Unable to say  where we are. Clinching his bilateral fists and arms. Grip strength is 5 out of 5 and equal bilaterally. Unable to perform finger to nose or unable to assess pronator drift. Patient refusing hold his arms out straight. 5 out of 5 and equal strength of bilateral lower extremities.  Skin: Skin is warm and dry.  Nursing note and vitals reviewed.    ED Treatments / Results  Labs (all labs ordered are listed, but only abnormal results are displayed) Labs Reviewed  CBC WITH DIFFERENTIAL/PLATELET - Abnormal; Notable for the following:       Result Value   RBC 3.78  (*)    Hemoglobin 11.1 (*)    HCT 32.4 (*)    Monocytes Absolute 1.2 (*)    All other components within normal limits  COMPREHENSIVE METABOLIC PANEL - Abnormal; Notable for the following:    Calcium 8.6 (*)    Albumin 3.1 (*)    AST 203 (*)    ALT 94 (*)    All other components within normal limits  RAPID URINE DRUG SCREEN, HOSP PERFORMED - Abnormal; Notable for the following:    Opiates POSITIVE (*)    Benzodiazepines POSITIVE (*)    All other components within normal limits  CK - Abnormal; Notable for the following:    Total CK 5,112 (*)    All other components within normal limits  ACETAMINOPHEN LEVEL - Abnormal; Notable for the following:    Acetaminophen (Tylenol), Serum <10 (*)    All other components within normal limits  CULTURE, BLOOD (ROUTINE X 2)  CULTURE, BLOOD (ROUTINE X 2)  ETHANOL  AMMONIA  TSH  VITAMIN B12  SEDIMENTATION RATE  HCV RNA QUANT  HEPATITIS C GENOTYPE  LEGIONELLA PNEUMOPHILA SEROGP 1 UR AG  STREP PNEUMONIAE URINARY ANTIGEN  RPR  I-STAT TROPONIN, ED  I-STAT CG4 LACTIC ACID, ED  I-STAT CG4 LACTIC ACID, ED    EKG  EKG Interpretation None       Radiology No results found.  Procedures Procedures (including critical care time)  Medications Ordered in ED Medications  0.9 %  sodium chloride infusion (not administered)  acetaminophen (TYLENOL) tablet 650 mg (not administered)    Or  acetaminophen (TYLENOL) suppository 650 mg (not administered)  magnesium sulfate IVPB 2 g 50 mL (not administered)  LORazepam (ATIVAN) injection 2-3 mg (not administered)  folic acid injection 1 mg (not administered)  thiamine (B-1) injection 100 mg (not administered)  enoxaparin (LOVENOX) injection 40 mg (not administered)  vancomycin (VANCOCIN) IVPB 1000 mg/200 mL premix (not administered)  ceFEPIme (MAXIPIME) 2 g in dextrose 5 % 50 mL IVPB (not administered)  vancomycin (VANCOCIN) IVPB 1000 mg/200 mL premix (not administered)  ceFEPIme (MAXIPIME) 2 g in  dextrose 5 % 50 mL IVPB (not administered)  naloxone (NARCAN) nasal spray 4 mg/0.1 mL (1 spray Nasal Provided for home use 08/17/17 1933)  naloxone (NARCAN) nasal spray 4 mg/0.1 mL (1 spray Nasal Provided for home use 08/17/17 1953)  sodium chloride 0.9 % bolus 1,000 mL (0 mLs Intravenous Stopped 08/17/17 2203)  sodium chloride 0.9 % bolus 1,000 mL (0 mLs Intravenous Stopped 08/17/17 2305)     Initial Impression / Assessment and Plan / ED Course  I have reviewed the triage vital signs and the nursing notes.  Pertinent labs & imaging results that were available during my care of the patient were reviewed by me and considered in my medical decision making (see chart for details).    Patient to the emergency  department with altered mental status from home. Mother believes the patient may have done heroin and maybe some other drugs. He just got released from the hospital 4 hours ago for similar presentation. Full workup at that time including labs, lumbar puncture, blood cultures. He did have elevation in LFTs and CK level during recent admission which improved with IV fluids. Patient is found to have a low-grade fever, he is oriented 2. He does wake up and is able to answer some questions. He denies any drug use. His exam is limited due to his altered mental status. Will recheck labs, try Narcan.   Patient did wake up a little more with Narcan. He however is still hallucinating, he is currently knocking on the door and fighting someone. When spoken to he will wake up, open his eyes, and laugh. His EKG and labs today with no significant abnormalities. Positive for opiates and benzodiazepines a drug screen. Will admit back to medicine for reevaluation, monitoring.  Vitals:   08/17/17 2201 08/17/17 2308 08/17/17 2351 08/18/17 0000  BP: 132/62 (!) 138/94 127/76 132/87  Pulse: 71 62 (!) 57 (!) 55  Resp: 18 18 14    Temp: 100.1 F (37.8 C) 97.7 F (36.5 C)    TempSrc: Rectal Axillary    SpO2: 96% 98%  96% 96%     Final Clinical Impressions(s) / ED Diagnoses   Final diagnoses:  Altered mental status, unspecified altered mental status type    New Prescriptions New Prescriptions   No medications on file     Jaynie CrumbleKirichenko, Ell Tiso, Cordelia Poche-C 08/18/17 0124    Rolland PorterJames, Mark, MD 08/29/17 [phone]

## 2017-08-18 NOTE — Progress Notes (Signed)
Pharmacy Antibiotic Note  Mark Strickland is a 26 y.o. male admitted on 08/17/2017 with pneumonia.  Pharmacy has been consulted for Vancomycin, cefepime dosing.  Plan: - Continue vancomycin as ordered - No Hx or proven Pseudomonas, not obese; will reduce cefepime to 1g IV q8 for HCAP     Temp (24hrs), Avg:99.2 F (37.3 C), Min:97.7 F (36.5 C), Max:100.4 F (38 C)   Recent Labs Lab 08/14/17 1754 08/15/17 0029 08/15/17 [id number] 08/15/17 [id number] 08/16/17 0304 08/17/17 0514 08/17/17 1926 08/17/17 2049  WBC 9.2  --   --  7.2 9.2 7.4 7.6  --   CREATININE 1.24  --  0.93  --  0.81 0.80 0.88  --   LATICACIDVEN  --  1.1  --   --   --   --   --  0.53    Estimated Creatinine Clearance: 134.3 mL/min (by C-G formula based on SCr of 0.88 mg/dL).    No Known Allergies  Antimicrobials this admission:  Vancomycin 08/18/2017 >> Cefepime 08/18/2017 >>   Dose adjustments this admission:  ---  Microbiology results:  8/28 BCx (x3): NGTD 8/28 CSF: NGTD 8/28 MRSA PCR: neg 8/29 BCx: NGTD  Thank you for allowing pharmacy to be a part of this patient's care.  Bernadene Personrew Moriah Loughry, PharmD, BCPS Pager: [phone] 08/18/2017, 11:57 AM

## 2017-08-18 NOTE — ED Notes (Signed)
Pt total IV fluids in 3 liters 3000 Pt output urine out total 2100

## 2017-08-18 NOTE — Progress Notes (Addendum)
Consult request has been received. CSW attempting to follow up at present time.  CSW attempted PSA, but the pt refused resources saying he doesn't need anything.  Pt presented as agitated and stated he wanted to leave, but when RN stated pt was being given valium the pt calmed down and was agreeable.  When asked again by the CSW if pt wished to have substance abuse and/or homelessness resources, pt again refused resources.  Please reconsult if future social work needs arise.  CSW signing off, as social work intervention is no longer needed.  Dorothe PeaJonathan F. Tiajuana Leppanen, Francesco SorLCSWA, LCAS, CSI Clinical Social Worker Ph: [phone]

## 2017-08-18 NOTE — ED Notes (Signed)
Patient was standing at the foot of the bed, pulling at lines and vital sign equipment.

## 2017-08-18 NOTE — ED Notes (Signed)
ED TO INPATIENT HANDOFF REPORT  Name/Age/Gender Mark Strickland 26 y.o. male  Code Status    Code Status Orders        Start     Ordered   08/18/17 1530  Full code  Continuous     08/18/17 1529    Code Status History    Date Active Date Inactive Code Status Order ID Comments User Context   08/15/2017  1:51 AM 08/17/2017  6:25 PM Full Code [id number]  Toy Baker, MD Inpatient      Home/SNF/Other Home  Chief Complaint Drug Overdose  Level of Care/Admitting Diagnosis ED Disposition    ED Disposition Condition [facility] Area: [facility] [[id number]]  Level of Care: Telemetry [5]  Admit to tele based on following criteria: Complex arrhythmia (Bradycardia/Tachycardia)  Diagnosis: Encephalopathy [914782]  Admitting Physician: Reyne Dumas [[id number]]  Attending Physician: Reyne Dumas [[id number]]  Estimated length of stay: past midnight tomorrow  Certification:: I certify this patient will need inpatient services for at least 2 midnights  PT Class (Do Not Modify): Inpatient [101]  PT Acc Code (Do Not Modify): Private [1]       Medical History Past Medical History:  Diagnosis Date  . Anxiety   . Depressed   . Opiate addiction ([place])   . Seizures ([place])    childhood    Allergies No Known Allergies  IV Location/Drains/Wounds Patient Lines/Drains/Airways Status   Active Line/Drains/Airways    None          Labs/Imaging Results for orders placed or performed during the hospital encounter of 08/17/17 (from the past 48 hour(s))  CBC with Differential     Status: Abnormal   Collection Time: 08/17/17  7:26 PM  Result Value Ref Range   WBC 7.6 4.0 - 10.5 K/uL   RBC 3.78 (L) 4.22 - 5.81 MIL/uL   Hemoglobin 11.1 (L) 13.0 - 17.0 g/dL   HCT 32.4 (L) 39.0 - 52.0 %   MCV 85.7 78.0 - 100.0 fL   MCH 29.4 26.0 - 34.0 pg   MCHC 34.3 30.0 - 36.0 g/dL   RDW 12.7 11.5 - 15.5 %   Platelets 251 150 - 400 K/uL   Neutrophils Relative % 69 %    Neutro Abs 5.2 1.7 - 7.7 K/uL   Lymphocytes Relative 15 %   Lymphs Abs 1.2 0.7 - 4.0 K/uL   Monocytes Relative 16 %   Monocytes Absolute 1.2 (H) 0.1 - 1.0 K/uL   Eosinophils Relative 0 %   Eosinophils Absolute 0.0 0.0 - 0.7 K/uL   Basophils Relative 0 %   Basophils Absolute 0.0 0.0 - 0.1 K/uL  Comprehensive metabolic panel     Status: Abnormal   Collection Time: 08/17/17  7:26 PM  Result Value Ref Range   Sodium 139 135 - 145 mmol/L   Potassium 3.8 3.5 - 5.1 mmol/L   Chloride 108 101 - 111 mmol/L   CO2 24 22 - 32 mmol/L   Glucose, Bld 86 65 - 99 mg/dL   BUN 9 6 - 20 mg/dL   Creatinine, Ser 0.88 0.61 - 1.24 mg/dL   Calcium 8.6 (L) 8.9 - 10.3 mg/dL   Total Protein 6.5 6.5 - 8.1 g/dL   Albumin 3.1 (L) 3.5 - 5.0 g/dL   AST 203 (H) 15 - 41 U/L   ALT 94 (H) 17 - 63 U/L   Alkaline Phosphatase 47 38 - 126 U/L   Total Bilirubin 0.5 0.3 - 1.2  mg/dL   GFR calc non Af Amer >60 >60 mL/min   GFR calc Af Amer >60 >60 mL/min    Comment: (NOTE) The eGFR has been calculated using the CKD EPI equation. This calculation has not been validated in all clinical situations. eGFR's persistently <60 mL/min signify possible Chronic Kidney Disease.    Anion gap 7 5 - 15  Ethanol     Status: None   Collection Time: 08/17/17  7:26 PM  Result Value Ref Range   Alcohol, Ethyl (B) <5 <5 mg/dL    Comment:        LOWEST DETECTABLE LIMIT FOR SERUM ALCOHOL IS 5 mg/dL FOR MEDICAL PURPOSES ONLY   CK     Status: Abnormal   Collection Time: 08/17/17  7:26 PM  Result Value Ref Range   Total CK 5,112 (H) 49 - 397 U/L    Comment: RESULTS CONFIRMED BY MANUAL DILUTION  Rapid urine drug screen (hospital performed)     Status: Abnormal   Collection Time: 08/17/17  7:40 PM  Result Value Ref Range   Opiates POSITIVE (A) NONE DETECTED   Cocaine NONE DETECTED NONE DETECTED   Benzodiazepines POSITIVE (A) NONE DETECTED   Amphetamines NONE DETECTED NONE DETECTED   Tetrahydrocannabinol NONE DETECTED NONE DETECTED    Barbiturates NONE DETECTED NONE DETECTED    Comment:        DRUG SCREEN FOR MEDICAL PURPOSES ONLY.  IF CONFIRMATION IS NEEDED FOR ANY PURPOSE, NOTIFY LAB WITHIN 5 DAYS.        LOWEST DETECTABLE LIMITS FOR URINE DRUG SCREEN Drug Class       Cutoff (ng/mL) Amphetamine      1000 Barbiturate      200 Benzodiazepine   940 Tricyclics       768 Opiates          300 Cocaine          300 THC              50   I-Stat Troponin, ED (not at [facility])     Status: None   Collection Time: 08/17/17  8:41 PM  Result Value Ref Range   Troponin i, poc 0.00 0.00 - 0.08 ng/mL   Comment 3            Comment: Due to the release kinetics of cTnI, a negative result within the first hours of the onset of symptoms does not rule out myocardial infarction with certainty. If myocardial infarction is still suspected, repeat the test at appropriate intervals.   I-Stat CG4 Lactic Acid, ED     Status: None   Collection Time: 08/17/17  8:49 PM  Result Value Ref Range   Lactic Acid, Venous 0.53 0.5 - 1.9 mmol/L  I-Stat CG4 Lactic Acid, ED     Status: None   Collection Time: 08/17/17 11:31 PM  Result Value Ref Range   Lactic Acid, Venous 0.61 0.5 - 1.9 mmol/L  Ammonia     Status: None   Collection Time: 08/18/17 12:01 AM  Result Value Ref Range   Ammonia 25 9 - 35 umol/L  TSH     Status: Abnormal   Collection Time: 08/18/17 12:01 AM  Result Value Ref Range   TSH 0.320 (L) 0.350 - 4.500 uIU/mL    Comment: Performed by a 3rd Generation assay with a functional sensitivity of <=0.01 uIU/mL.  Vitamin B12     Status: Abnormal   Collection Time: 08/18/17 12:01 AM  Result Value Ref Range  Vitamin B-12 1,312 (H) 180 - 914 pg/mL    Comment: (NOTE) This assay is not validated for testing neonatal or myeloproliferative syndrome specimens for Vitamin B12 levels. Performed at [facility], [place] [address], [place], [place] [address]   Sedimentation rate     Status: Abnormal   Collection Time: 08/18/17  12:01 AM  Result Value Ref Range   Sed Rate 61 (H) 0 - 16 mm/hr  RPR     Status: None   Collection Time: 08/18/17 12:01 AM  Result Value Ref Range   RPR Ser Ql Non Reactive Non Reactive    Comment: (NOTE) Performed At: [facility] [address] [place], [place] [id number] Lindon Romp MD ML:[id number]   Strep pneumoniae urinary antigen     Status: None   Collection Time: 08/18/17 12:02 AM  Result Value Ref Range   Strep Pneumo Urinary Antigen NEGATIVE NEGATIVE    Comment:        Infection due to S. pneumoniae cannot be absolutely ruled out since the antigen present may be below the detection limit of the test. Performed at [facility], [address] [place], [place] [address]   Acetaminophen level     Status: Abnormal   Collection Time: 08/18/17 12:06 AM  Result Value Ref Range   Acetaminophen (Tylenol), Serum <10 (L) 10 - 30 ug/mL    Comment:        THERAPEUTIC CONCENTRATIONS VARY SIGNIFICANTLY. A RANGE OF 10-30 ug/mL MAY BE AN EFFECTIVE CONCENTRATION FOR MANY PATIENTS. HOWEVER, SOME ARE BEST TREATED AT CONCENTRATIONS OUTSIDE THIS RANGE. ACETAMINOPHEN CONCENTRATIONS >150 ug/mL AT 4 HOURS AFTER INGESTION AND >50 ug/mL AT 12 HOURS AFTER INGESTION ARE OFTEN ASSOCIATED WITH TOXIC REACTIONS.    Ct Head Wo Contrast  Result Date: 08/18/2017 CLINICAL DATA:  Altered level of consciousness. History of drug abuse and seizures. EXAM: CT HEAD WITHOUT CONTRAST TECHNIQUE: Contiguous axial images were obtained from the base of the skull through the vertex without intravenous contrast. COMPARISON:  CT HEAD August 14, 2017 FINDINGS: BRAIN: No intraparenchymal hemorrhage, mass effect nor midline shift. The ventricles and sulci are normal. No acute large vascular territory infarcts. No abnormal extra-axial fluid collections. Basal cisterns are patent. VASCULAR: Unremarkable. SKULL/SOFT TISSUES: No skull fracture. No significant soft tissue swelling. ORBITS/SINUSES:  The included ocular globes and orbital contents are normal.Soft tissue opacifies and expands the RIGHT maxillary sinus with RIGHT frontal and ethmoid sinusitis. Mild LEFT maxillary sinus mucosal thickening. OTHER: None. IMPRESSION: 1. Normal noncontrast CT HEAD. 2. 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 Electronically Signed   By: Elon Alas M.D.   On: 08/18/2017 01:29    Pending Labs Unresulted Labs    Start     Ordered   08/25/17 0500  Creatinine, serum  (enoxaparin (LOVENOX)    CrCl >/= 30 ml/min)  Weekly,   R    Comments:  while on enoxaparin therapy    08/18/17 1529   08/19/17 0500  Comprehensive metabolic panel  Tomorrow morning,   R     08/18/17 0812   08/19/17 0500  CBC  Tomorrow morning,   R     08/18/17 0812   08/19/17 0500  CK  Daily,   R     08/18/17 0813   08/18/17 0003  Culture, blood (routine x 2)  BLOOD CULTURE X 2,   R     08/18/17 0003   08/18/17 0002  Hepatitis C genotype  Once,   R     08/18/17 0001   08/18/17 0002  Legionella Pneumophila Serogp 1 Ur Ag  Once,   R     08/18/17 0001   08/18/17 0001  HCV RNA quant  Once,   R     08/18/17 0001      Vitals/Pain Today's Vitals   08/18/17 1900 08/18/17 1907 08/18/17 1939 08/18/17 2025  BP: 123/80 122/80 124/68 122/86  Pulse: (!) 50 64 (!) 59 (!) 51  Resp: '20 15 18 ' (!) 22  Temp:      TempSrc:      SpO2: 100% 100% 100% 99%  PainSc:        Isolation Precautions No active isolations  Medications Medications  0.9 %  sodium chloride infusion ( Intravenous New Bag/Given 08/18/17 1538)  acetaminophen (TYLENOL) tablet 650 mg (not administered)    Or  acetaminophen (TYLENOL) suppository 650 mg (not administered)  LORazepam (ATIVAN) injection 2-3 mg (not administered)  folic acid injection 1 mg (1 mg Intravenous Given 08/18/17 1027)  thiamine (B-1) injection 100 mg (100 mg Intravenous Given 08/18/17 1029)  enoxaparin (LOVENOX) injection 40 mg (40 mg  Subcutaneous Given 08/18/17 0204)  vancomycin (VANCOCIN) IVPB 1000 mg/200 mL premix (1,000 mg Intravenous New Bag/Given 08/18/17 1806)  ceFEPIme (MAXIPIME) 1 g in dextrose 5 % 50 mL IVPB (0 g Intravenous Stopped 08/18/17 1805)  nicotine (NICODERM CQ - dosed in mg/24 hours) patch 21 mg (21 mg Transdermal Patch Applied 08/18/17 1837)  naloxone (NARCAN) nasal spray 4 mg/0.1 mL (1 spray Nasal Provided for home use 08/17/17 1933)  naloxone [facility]) nasal spray 4 mg/0.1 mL (1 spray Nasal Provided for home use 08/17/17 1953)  sodium chloride 0.9 % bolus 1,000 mL (0 mLs Intravenous Stopped 08/17/17 2203)  sodium chloride 0.9 % bolus 1,000 mL (0 mLs Intravenous Stopped 08/17/17 2305)  magnesium sulfate IVPB 2 g 50 mL (0 g Intravenous Stopped 08/18/17 0243)  vancomycin (VANCOCIN) IVPB 1000 mg/200 mL premix (0 mg Intravenous Stopped 08/18/17 0650)  ceFEPIme (MAXIPIME) 2 g in dextrose 5 % 50 mL IVPB (0 g Intravenous Stopped 08/18/17 0322)  diazepam (VALIUM) injection 10 mg (10 mg Intramuscular Given 08/18/17 1803)    Mobility walks

## 2017-08-18 NOTE — Consult Note (Signed)
[facility] Face-to-Face Psychiatry Consult   Reason for Consult:  Capacity evaluation and heroin dependence Referring Physician:  Dr. Allyson Sabal Patient Identification: Laszlo Ellerby MRN:  [id number] Principal Diagnosis: Altered mental status Diagnosis:   Patient Active Problem List   Diagnosis Date Noted  . Altered mental status [R41.82]   . Polysubstance overdose [T50.901A] 08/15/2017  . Rhabdomyolysis [M62.82] 08/15/2017  . Elevated LFTs [R79.89] 08/14/2017  . Fever [R50.9] 08/14/2017  . Drug overdose, intentional ([place]) [T50.902A] 12/03/2015  . Encephalopathy, toxic [G92] 12/03/2015    Total Time spent with patient: 1 hour  Subjective:   Kennis Wissmann is a 26 y.o. male patient admitted with this second medical admission within 24 hours for Rhabdomyolysis, AMS with opioid and benzodiazepine intoxication.  HPI:  Sterling Mondo is a 26 y.o. male, seen, chart reviewed, for this face to face psychiatric consultation and evaluation of Altered mental state due to re-admission for rhabdomyolysis, with opioid and benzodiazepine abuse with intoxication. He has been with medical history significant of narcotic abuse and dependence polysubstance abuse seizures during childhood, depression, and anxiety. Patient is awake, alert, some what oriented that he knows he is in hospital. Patient is unable to give me detailed history of what happened after he left from the hospital. Reportedly he was found high on drugs and his mother pointed out and told she is going to call the authorities and he left home and does not remember what happened after that and does not remember who brought him to the hospital and does not know his current medical problems and states he is not completely waken yet and does not think clear during this evaluation. He has been sober periods of one year over here and there. He had a girl friend x one month, few months ago and was working until last week, he worked Dietitian x 3  weeks. He is never hold a job for long time. His money is trigger to fall back on his relapse. His mother worried about again relapsing on his drug if returns home without long term rehab treatment.   Past Psychiatric History: He has history of out patient medication management from [facility] and also history of rehabilitation at [facility], [place], [facility] [place], and history of Heroin IVDA and Xanax. Patient was evaluated at [facility] on 06/21/2016 for medical clearance for [facility], rehabilitation treatment center.   Diagnosis: Major Depressive Disorder, Opiate Use, Severe, Benzo Use Disorder, Severe  Risk to Self:   Risk to Others:   Prior Inpatient Therapy:   Prior Outpatient Therapy:    Past Medical History:  Past Medical History:  Diagnosis Date  . Anxiety   . Depressed   . Opiate addiction ([place])   . Seizures ([place])    childhood   History reviewed. No pertinent surgical history. Family History:  Family History  Problem Relation Age of Onset  . Family history unknown: Yes   Family Psychiatric  History: No family history of illicit drugs and his father and grandpa has alcohol abuse verse dependence.  Social History:  History  Alcohol Use  . Yes     History  Drug Use  . Types: IV    Comment: Heroin     Social History   Social History  . Marital status: Single    Spouse name: N/A  . Number of children: N/A  . Years of education: N/A   Social History Main Topics  . Smoking status: Current Every Day Smoker  Packs/day: 1.00    Types: Cigarettes  . Smokeless tobacco: Never Used  . Alcohol use Yes  . Drug use: Yes    Types: IV     Comment: Heroin   . Sexual activity: Not Asked   Other Topics Concern  . None   Social History Narrative  . 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 Parent divorced when he was about 59 years old and dad lived in  [place]. He has two younger sister (65, 24).   Allergies:  No Known Allergies  Labs:  Results for orders placed or performed during the hospital encounter of 08/17/17 (from the past 48 hour(s))  CBC with Differential     Status: Abnormal   Collection Time: 08/17/17  7:26 PM  Result Value Ref Range   WBC 7.6 4.0 - 10.5 K/uL   RBC 3.78 (L) 4.22 - 5.81 MIL/uL   Hemoglobin 11.1 (L) 13.0 - 17.0 g/dL   HCT 32.4 (L) 39.0 - 52.0 %   MCV 85.7 78.0 - 100.0 fL   MCH 29.4 26.0 - 34.0 pg   MCHC 34.3 30.0 - 36.0 g/dL   RDW 12.7 11.5 - 15.5 %   Platelets 251 150 - 400 K/uL   Neutrophils Relative % 69 %   Neutro Abs 5.2 1.7 - 7.7 K/uL   Lymphocytes Relative 15 %   Lymphs Abs 1.2 0.7 - 4.0 K/uL   Monocytes Relative 16 %   Monocytes Absolute 1.2 (H) 0.1 - 1.0 K/uL   Eosinophils Relative 0 %   Eosinophils Absolute 0.0 0.0 - 0.7 K/uL   Basophils Relative 0 %   Basophils Absolute 0.0 0.0 - 0.1 K/uL  Comprehensive metabolic panel     Status: Abnormal   Collection Time: 08/17/17  7:26 PM  Result Value Ref Range   Sodium 139 135 - 145 mmol/L   Potassium 3.8 3.5 - 5.1 mmol/L   Chloride 108 101 - 111 mmol/L   CO2 24 22 - 32 mmol/L   Glucose, Bld 86 65 - 99 mg/dL   BUN 9 6 - 20 mg/dL   Creatinine, Ser 0.88 0.61 - 1.24 mg/dL   Calcium 8.6 (L) 8.9 - 10.3 mg/dL   Total Protein 6.5 6.5 - 8.1 g/dL   Albumin 3.1 (L) 3.5 - 5.0 g/dL   AST 203 (H) 15 - 41 U/L   ALT 94 (H) 17 - 63 U/L   Alkaline Phosphatase 47 38 - 126 U/L   Total Bilirubin 0.5 0.3 - 1.2 mg/dL   GFR calc non Af Amer >60 >60 mL/min   GFR calc Af Amer >60 >60 mL/min    Comment: (NOTE) The eGFR has been calculated using the CKD EPI equation. This calculation has not been validated in all clinical situations. eGFR's persistently <60 mL/min signify possible Chronic Kidney Disease.    Anion gap 7 5 - 15  Ethanol     Status: None   Collection Time: 08/17/17  7:26 PM  Result Value Ref Range   Alcohol, Ethyl (B) <5 <5 mg/dL    Comment:         LOWEST DETECTABLE LIMIT FOR SERUM ALCOHOL IS 5 mg/dL FOR MEDICAL PURPOSES ONLY   CK     Status: Abnormal   Collection Time: 08/17/17  7:26 PM  Result Value Ref Range   Total CK 5,112 (H) 49 - 397 U/L  Comment: RESULTS CONFIRMED BY MANUAL DILUTION  Rapid urine drug screen (hospital performed)     Status: Abnormal   Collection Time: 08/17/17  7:40 PM  Result Value Ref Range   Opiates POSITIVE (A) NONE DETECTED   Cocaine NONE DETECTED NONE DETECTED   Benzodiazepines POSITIVE (A) NONE DETECTED   Amphetamines NONE DETECTED NONE DETECTED   Tetrahydrocannabinol NONE DETECTED NONE DETECTED   Barbiturates NONE DETECTED NONE DETECTED    Comment:        DRUG SCREEN FOR MEDICAL PURPOSES ONLY.  IF CONFIRMATION IS NEEDED FOR ANY PURPOSE, NOTIFY LAB WITHIN 5 DAYS.        LOWEST DETECTABLE LIMITS FOR URINE DRUG SCREEN Drug Class       Cutoff (ng/mL) Amphetamine      1000 Barbiturate      200 Benzodiazepine   119 Tricyclics       147 Opiates          300 Cocaine          300 THC              50   I-Stat Troponin, ED (not at [facility])     Status: None   Collection Time: 08/17/17  8:41 PM  Result Value Ref Range   Troponin i, poc 0.00 0.00 - 0.08 ng/mL   Comment 3            Comment: Due to the release kinetics of cTnI, a negative result within the first hours of the onset of symptoms does not rule out myocardial infarction with certainty. If myocardial infarction is still suspected, repeat the test at appropriate intervals.   I-Stat CG4 Lactic Acid, ED     Status: None   Collection Time: 08/17/17  8:49 PM  Result Value Ref Range   Lactic Acid, Venous 0.53 0.5 - 1.9 mmol/L  Ammonia     Status: None   Collection Time: 08/18/17 12:01 AM  Result Value Ref Range   Ammonia 25 9 - 35 umol/L  TSH     Status: Abnormal   Collection Time: 08/18/17 12:01 AM  Result Value Ref Range   TSH 0.320 (L) 0.350 - 4.500 uIU/mL    Comment: Performed by a 3rd Generation assay with a functional  sensitivity of <=0.01 uIU/mL.  Vitamin B12     Status: Abnormal   Collection Time: 08/18/17 12:01 AM  Result Value Ref Range   Vitamin B-12 1,312 (H) 180 - 914 pg/mL    Comment: (NOTE) This assay is not validated for testing neonatal or myeloproliferative syndrome specimens for Vitamin B12 levels. Performed at [facility], [place] [address], [place], [place] [address]   Sedimentation rate     Status: Abnormal   Collection Time: 08/18/17 12:01 AM  Result Value Ref Range   Sed Rate 61 (H) 0 - 16 mm/hr  Strep pneumoniae urinary antigen     Status: None   Collection Time: 08/18/17 12:02 AM  Result Value Ref Range   Strep Pneumo Urinary Antigen NEGATIVE NEGATIVE    Comment:        Infection due to S. pneumoniae cannot be absolutely ruled out since the antigen present may be below the detection limit of the test. Performed at [facility], [place] [address] [place], [place] [address]   Acetaminophen level     Status: Abnormal   Collection Time: 08/18/17 12:06 AM  Result Value Ref Range   Acetaminophen (Tylenol), Serum <10 (L) 10 -  30 ug/mL    Comment:        THERAPEUTIC CONCENTRATIONS VARY SIGNIFICANTLY. A RANGE OF 10-30 ug/mL MAY BE AN EFFECTIVE CONCENTRATION FOR MANY PATIENTS. HOWEVER, SOME ARE BEST TREATED AT CONCENTRATIONS OUTSIDE THIS RANGE. ACETAMINOPHEN CONCENTRATIONS >150 ug/mL AT 4 HOURS AFTER INGESTION AND >50 ug/mL AT 12 HOURS AFTER INGESTION ARE OFTEN ASSOCIATED WITH TOXIC REACTIONS.     Current Facility-Administered Medications  Medication Dose Route Frequency Provider Last Rate Last Dose  . 0.9 %  sodium chloride infusion   Intravenous Continuous Reyne Dumas, MD 125 mL/hr at 08/18/17 0839    . acetaminophen (TYLENOL) tablet 650 mg  650 mg Oral Q6H PRN Jani Gravel, MD       Or  . acetaminophen (TYLENOL) suppository 650 mg  650 mg Rectal Q6H PRN Jani Gravel, MD      . ceFEPIme (MAXIPIME) 1 g in dextrose 5 % 50 mL IVPB  1 g Intravenous Q8H Wofford,  Drew A, RPH      . enoxaparin (LOVENOX) injection 40 mg  40 mg Subcutaneous QHS Jani Gravel, MD   40 mg at 08/18/17 0204  . folic acid injection 1 mg  1 mg Intravenous Daily Jani Gravel, MD   1 mg at 08/18/17 1027  . LORazepam (ATIVAN) injection 2-3 mg  2-3 mg Intravenous Q1H PRN Jani Gravel, MD      . thiamine (B-1) injection 100 mg  100 mg Intravenous Daily Jani Gravel, MD   100 mg at 08/18/17 1029  . vancomycin (VANCOCIN) IVPB 1000 mg/200 mL premix  1,000 mg Intravenous Lysle Dingwall, MD   Stopped at 08/18/17 1006   Current Outpatient Prescriptions  Medication Sig Dispense Refill  . gabapentin (NEURONTIN) 300 MG capsule Take 300 mg by mouth 3 (three) times daily.    . sertraline (ZOLOFT) 100 MG tablet Take 100 mg by mouth daily.    Marland Kitchen amoxicillin-clavulanate (AUGMENTIN) 875-125 MG tablet Take 1 tablet by mouth every 12 (twelve) hours. 14 tablet 0  . magnesium oxide (MAG-OX) 400 (241.3 Mg) MG tablet Take 1 tablet (400 mg total) by mouth daily. 7 tablet 0  . potassium phosphate, monobasic, (K-PHOS ORIGINAL) 500 MG tablet Take 1 tablet (500 mg total) by mouth 2 (two) times daily with a meal. 8 tablet 0    Musculoskeletal: Strength & Muscle Tone: within normal limits Gait & Station: unable to stand Patient leans: N/A  Psychiatric Specialty Exam: Physical Exam as per history and physical  ROS He has been drowsy and sleepy at this time. No Fever-chills, No Headache, No changes with Vision or hearing, reports vertigo No problems swallowing food or Liquids, No Chest pain, Cough or Shortness of Breath, No Abdominal pain, No Nausea or Vommitting, Bowel movements are regular, No Blood in stool or Urine, No dysuria, No new skin rashes or bruises, No new joints pains-aches,  No new weakness, tingling, numbness in any extremity, No recent weight gain or loss, No polyuria, polydypsia or polyphagia,  A full 10 point Review of Systems was done, except as stated above, all other Review of Systems  were negative.  Blood pressure 128/86, pulse (!) 56, temperature 97.7 F (36.5 C), temperature source Axillary, resp. rate 19, SpO2 97 %.There is no height or weight on file to calculate BMI.  General Appearance: Guarded  Eye Contact:  Good  Speech:  Clear and Coherent  Volume:  Decreased  Mood:  Depressed  Affect:  Non-Congruent and Inappropriate  Thought Process:  Disorganized and Irrelevant  Orientation:  Full (Time, Place, and Person)  Thought Content:  Illogical and Rumination  Suicidal Thoughts:  No  Homicidal Thoughts:  No  Memory:  Immediate;   Fair Recent;   Poor Remote;   Good  Judgement:  Impaired  Insight:  Shallow  Psychomotor Activity:  Decreased  Concentration:  Concentration: Poor and Attention Span: Poor  Recall:  Poor  Fund of Knowledge:  Fair  Language:  Good  Akathisia:  Negative  Handed:  Right  AIMS (if indicated):     Assets:  Communication Skills Desire for Improvement Physical Health Social Support Talents/Skills Vocational/Educational  ADL's:  Impaired  Cognition:  Impaired,  Moderate  Sleep:        Treatment Plan Summary: 26 years old young male with diagnosis of Major Depressive Disorder, Opiate Use, Severe, Benzo Use Disorder, Severe, presented with intoxication of heroin and benzo's within few hours after discharged home and also found rhabdomyolysis. Patient has failed several rehabilitation treatments over the last ten years and his mother worried about him put in danger without intention of suicide.  Based on evaluation today he does not meet criteria of capacity to make his own medical decisions.  Patient benefit from long term substance abuse rehabilitation - may be at [facility] his home medication when medically stable Monitor of opioid and benzodiazepine withdrawal and CIWA protcol  Daily contact with patient to assess and evaluate symptoms and progress in treatment and Medication management  Disposition: Supportive therapy  provided about ongoing stressors.  Ambrose Finland, MD 08/18/2017 12:50 PM

## 2017-08-19 DIAGNOSIS — G9340 Encephalopathy, unspecified: Secondary | ICD-10-CM

## 2017-08-19 LAB — COMPREHENSIVE METABOLIC PANEL
ALBUMIN: 2.9 g/dL — AB (ref 3.5–5.0)
ALK PHOS: 41 U/L (ref 38–126)
ALT: 69 U/L — ABNORMAL HIGH (ref 17–63)
ANION GAP: 7 (ref 5–15)
AST: 76 U/L — ABNORMAL HIGH (ref 15–41)
BILIRUBIN TOTAL: 0.6 mg/dL (ref 0.3–1.2)
BUN: <5 mg/dL — ABNORMAL LOW (ref 6–20)
CALCIUM: 8.7 mg/dL — AB (ref 8.9–10.3)
CO2: 27 mmol/L (ref 22–32)
Chloride: 106 mmol/L (ref 101–111)
Creatinine, Ser: 0.83 mg/dL (ref 0.61–1.24)
GLUCOSE: 101 mg/dL — AB (ref 65–99)
Potassium: 3.5 mmol/L (ref 3.5–5.1)
Sodium: 140 mmol/L (ref 135–145)
TOTAL PROTEIN: 6.4 g/dL — AB (ref 6.5–8.1)

## 2017-08-19 LAB — CBC
HCT: 33.2 % — ABNORMAL LOW (ref 39.0–52.0)
Hemoglobin: 11.5 g/dL — ABNORMAL LOW (ref 13.0–17.0)
MCH: 29.9 pg (ref 26.0–34.0)
MCHC: 34.6 g/dL (ref 30.0–36.0)
MCV: 86.5 fL (ref 78.0–100.0)
Platelets: 333 10*3/uL (ref 150–400)
RBC: 3.84 MIL/uL — ABNORMAL LOW (ref 4.22–5.81)
RDW: 12.7 % (ref 11.5–15.5)
WBC: 7.6 10*3/uL (ref 4.0–10.5)

## 2017-08-19 LAB — CK: CK TOTAL: 1819 U/L — AB (ref 49–397)

## 2017-08-19 LAB — LEGIONELLA PNEUMOPHILA SEROGP 1 UR AG: L. pneumophila Serogp 1 Ur Ag: NEGATIVE

## 2017-08-19 MED ORDER — SERTRALINE HCL 50 MG PO TABS
150.0000 mg | ORAL_TABLET | Freq: Every day | ORAL | Status: DC
  Administered 2017-08-20 – 2017-08-21 (×2): 150 mg via ORAL
  Filled 2017-08-19 (×2): qty 1

## 2017-08-19 MED ORDER — SERTRALINE HCL 100 MG PO TABS
100.0000 mg | ORAL_TABLET | Freq: Every day | ORAL | Status: DC
  Filled 2017-08-19: qty 1

## 2017-08-19 MED ORDER — SODIUM CHLORIDE 0.9 % IV SOLN
INTRAVENOUS | Status: DC
  Administered 2017-08-19: 08:00:00 via INTRAVENOUS

## 2017-08-19 MED ORDER — HALOPERIDOL LACTATE 5 MG/ML IJ SOLN
5.0000 mg | Freq: Four times a day (QID) | INTRAMUSCULAR | Status: DC | PRN
  Administered 2017-08-20: 5 mg via INTRAVENOUS
  Filled 2017-08-19: qty 1

## 2017-08-19 MED ORDER — KETOROLAC TROMETHAMINE 30 MG/ML IJ SOLN
30.0000 mg | Freq: Four times a day (QID) | INTRAMUSCULAR | Status: DC | PRN
  Administered 2017-08-19: 30 mg via INTRAVENOUS
  Filled 2017-08-19: qty 1

## 2017-08-19 MED ORDER — GABAPENTIN 300 MG PO CAPS
300.0000 mg | ORAL_CAPSULE | Freq: Three times a day (TID) | ORAL | Status: DC
  Administered 2017-08-19 – 2017-08-21 (×7): 300 mg via ORAL
  Filled 2017-08-19 (×7): qty 1

## 2017-08-19 MED ORDER — HALOPERIDOL LACTATE 5 MG/ML IJ SOLN
INTRAMUSCULAR | Status: AC
  Administered 2017-08-19: 5 mg
  Filled 2017-08-19: qty 2

## 2017-08-19 MED ORDER — AMOXICILLIN-POT CLAVULANATE 875-125 MG PO TABS
1.0000 | ORAL_TABLET | Freq: Two times a day (BID) | ORAL | Status: DC
  Administered 2017-08-19 – 2017-08-21 (×5): 1 via ORAL
  Filled 2017-08-19 (×5): qty 1

## 2017-08-19 MED ORDER — TRAMADOL HCL 50 MG PO TABS
50.0000 mg | ORAL_TABLET | Freq: Four times a day (QID) | ORAL | Status: DC | PRN
  Administered 2017-08-19: 50 mg via ORAL
  Filled 2017-08-19 (×2): qty 1

## 2017-08-19 MED ORDER — ZOLPIDEM TARTRATE 5 MG PO TABS
5.0000 mg | ORAL_TABLET | Freq: Once | ORAL | Status: AC
  Administered 2017-08-19: 5 mg via ORAL
  Filled 2017-08-19: qty 1

## 2017-08-19 MED ORDER — DIAZEPAM 5 MG/ML IJ SOLN
5.0000 mg | Freq: Three times a day (TID) | INTRAMUSCULAR | Status: DC | PRN
  Administered 2017-08-19 – 2017-08-21 (×4): 5 mg via INTRAVENOUS
  Filled 2017-08-19 (×5): qty 2

## 2017-08-19 MED ORDER — ZOLPIDEM TARTRATE 5 MG PO TABS
5.0000 mg | ORAL_TABLET | Freq: Every evening | ORAL | Status: DC | PRN
  Administered 2017-08-20: 5 mg via ORAL
  Filled 2017-08-19: qty 1

## 2017-08-19 MED ORDER — MIRTAZAPINE 15 MG PO TABS
15.0000 mg | ORAL_TABLET | Freq: Every day | ORAL | Status: DC
  Administered 2017-08-19 – 2017-08-20 (×2): 15 mg via ORAL
  Filled 2017-08-19 (×2): qty 1

## 2017-08-19 NOTE — Consult Note (Signed)
[facility] Face-to-Face Psychiatry Consult   Reason for Consult:  Capacity evaluation and heroin dependence Referring Physician:  Dr. Allyson Sabal Patient Identification: Mark Strickland MRN:  [id number] Principal Diagnosis: Altered mental status Diagnosis:   Patient Active Problem List   Diagnosis Date Noted  . Encephalopathy [G93.40] 08/18/2017  . Altered mental status [R41.82]   . Polysubstance overdose [T50.901A] 08/15/2017  . Rhabdomyolysis [M62.82] 08/15/2017  . Elevated LFTs [R79.89] 08/14/2017  . Fever [R50.9] 08/14/2017  . Drug overdose, intentional ([place]) [T50.902A] 12/03/2015  . Encephalopathy, toxic [G92] 12/03/2015    Total Time spent with patient: 1 hour  Subjective:   Mark Strickland is a 26 y.o. male patient admitted with this second medical admission within 24 hours for Rhabdomyolysis, AMS with opioid and benzodiazepine intoxication.  HPI:  Mark Strickland is a 26 y.o. male, seen, chart reviewed, for this face to face psychiatric consultation and evaluation of Altered mental state due to re-admission for rhabdomyolysis, with opioid and benzodiazepine abuse with intoxication. He has been with medical history significant of narcotic abuse and dependence polysubstance abuse seizures during childhood, depression, and anxiety. Patient is awake, alert, some what oriented that he knows he is in hospital. Patient is unable to give me detailed history of what happened after he left from the hospital. Reportedly he was found high on drugs and his mother pointed out and told she is going to call the authorities and he left home and does not remember what happened after that and does not remember who brought him to the hospital and does not know his current medical problems and states he is not completely waken yet and does not think clear during this evaluation. He has been sober periods of one year over here and there. He had a girl friend x one month, few months ago and was working until last week, he  worked Dietitian x 3 weeks. He is never hold a job for long time. His money is trigger to fall back on his relapse. His mother worried about again relapsing on his drug if returns home without long term rehab treatment.   Past Psychiatric History: He has history of out patient medication management from [facility] and also history of rehabilitation at [facility], [place], [facility] [place], and history of Heroin IVDA and Xanax. Patient was evaluated at [facility] on 06/21/2016 for medical clearance for [facility], rehabilitation treatment center.   08/19/2017 Interval history: Patient seen for this psychiatric consultation follow-up today. Patient appeared with the 4 point soft Restraints secondary to trying to from the hospital while placed on involuntary commitment secondary to drug-induced delirium. Patient has been depressed and delirious. Patient reported he has been missing his family members, fixing his car and going back to the job and also reported to the staff RN his mom was involved in a T-bone car accident. Patient is not reliable because of his ongoing, polysubstance abuse, patient relapsed and using IV drug abuse which is a heroin within 3 hours after discharge from the hospital. Patient is depressed, emotional and dysphoric during this evaluation and also minimizes his substance abuse and willing to participate in [facility] [place] upon discharge from hospital. Unit CSW to contact patient mother regarding appropriate information versus fabricated information / confabulation.  Risk to Self: Is patient at risk for suicide?: No Risk to Others:   Prior Inpatient Therapy:   Prior Outpatient Therapy:    Past Medical History:  Past Medical History:  Diagnosis Date  .  Anxiety   . Depressed   . Opiate addiction ([place])   . Seizures ([place])    childhood   History reviewed. No pertinent surgical history. Family History:  Family History  Problem Relation Age of  Onset  . Family history unknown: Yes   Family Psychiatric  History: No family history of illicit drugs and his father and grandpa has alcohol abuse verse dependence.  Social History:  History  Alcohol Use  . Yes     History  Drug Use  . Types: IV    Comment: Heroin     Social History   Social History  . Marital status: Single    Spouse name: N/A  . Number of children: N/A  . Years of education: N/A   Social History Main Topics  . Smoking status: Current Every Day Smoker    Packs/day: 1.00    Types: Cigarettes  . Smokeless tobacco: Never Used  . Alcohol use Yes  . Drug use: Yes    Types: IV     Comment: Heroin   . Sexual activity: Not Asked   Other Topics Concern  . None   Social History Narrative  . 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 Parent divorced when he was about 45 years old and dad lived in [place]. He has two younger sister (31, 68).   Allergies:  No Known Allergies  Labs:  Results for orders placed or performed during the hospital encounter of 08/17/17 (from the past 48 hour(s))  CBC with Differential     Status: Abnormal   Collection Time: 08/17/17  7:26 PM  Result Value Ref Range   WBC 7.6 4.0 - 10.5 K/uL   RBC 3.78 (L) 4.22 - 5.81 MIL/uL   Hemoglobin 11.1 (L) 13.0 - 17.0 g/dL   HCT 32.4 (L) 39.0 - 52.0 %   MCV 85.7 78.0 - 100.0 fL   MCH 29.4 26.0 - 34.0 pg   MCHC 34.3 30.0 - 36.0 g/dL   RDW 12.7 11.5 - 15.5 %   Platelets 251 150 - 400 K/uL   Neutrophils Relative % 69 %   Neutro Abs 5.2 1.7 - 7.7 K/uL   Lymphocytes Relative 15 %   Lymphs Abs 1.2 0.7 - 4.0 K/uL   Monocytes Relative 16 %   Monocytes Absolute 1.2 (H) 0.1 - 1.0 K/uL   Eosinophils Relative 0 %   Eosinophils Absolute 0.0 0.0 - 0.7 K/uL   Basophils Relative 0 %   Basophils Absolute 0.0 0.0 - 0.1 K/uL  Comprehensive metabolic panel     Status: Abnormal   Collection Time:  08/17/17  7:26 PM  Result Value Ref Range   Sodium 139 135 - 145 mmol/L   Potassium 3.8 3.5 - 5.1 mmol/L   Chloride 108 101 - 111 mmol/L   CO2 24 22 - 32 mmol/L   Glucose, Bld 86 65 - 99 mg/dL   BUN 9 6 - 20 mg/dL   Creatinine, Ser 0.88 0.61 - 1.24 mg/dL   Calcium 8.6 (L) 8.9 - 10.3 mg/dL   Total Protein 6.5 6.5 - 8.1 g/dL   Albumin 3.1 (L) 3.5 - 5.0 g/dL   AST 203 (H) 15 - 41 U/L   ALT 94 (H) 17 - 63 U/L   Alkaline Phosphatase 47 38 - 126 U/L   Total Bilirubin 0.5 0.3 - 1.2 mg/dL   GFR calc  non Af Amer >60 >60 mL/min   GFR calc Af Amer >60 >60 mL/min    Comment: (NOTE) The eGFR has been calculated using the CKD EPI equation. This calculation has not been validated in all clinical situations. eGFR's persistently <60 mL/min signify possible Chronic Kidney Disease.    Anion gap 7 5 - 15  Ethanol     Status: None   Collection Time: 08/17/17  7:26 PM  Result Value Ref Range   Alcohol, Ethyl (B) <5 <5 mg/dL    Comment:        LOWEST DETECTABLE LIMIT FOR SERUM ALCOHOL IS 5 mg/dL FOR MEDICAL PURPOSES ONLY   CK     Status: Abnormal   Collection Time: 08/17/17  7:26 PM  Result Value Ref Range   Total CK 5,112 (H) 49 - 397 U/L    Comment: RESULTS CONFIRMED BY MANUAL DILUTION  Rapid urine drug screen (hospital performed)     Status: Abnormal   Collection Time: 08/17/17  7:40 PM  Result Value Ref Range   Opiates POSITIVE (A) NONE DETECTED   Cocaine NONE DETECTED NONE DETECTED   Benzodiazepines POSITIVE (A) NONE DETECTED   Amphetamines NONE DETECTED NONE DETECTED   Tetrahydrocannabinol NONE DETECTED NONE DETECTED   Barbiturates NONE DETECTED NONE DETECTED    Comment:        DRUG SCREEN FOR MEDICAL PURPOSES ONLY.  IF CONFIRMATION IS NEEDED FOR ANY PURPOSE, NOTIFY LAB WITHIN 5 DAYS.        LOWEST DETECTABLE LIMITS FOR URINE DRUG SCREEN Drug Class       Cutoff (ng/mL) Amphetamine      1000 Barbiturate      200 Benzodiazepine   161 Tricyclics       096 Opiates           300 Cocaine          300 THC              50   I-Stat Troponin, ED (not at [facility])     Status: None   Collection Time: 08/17/17  8:41 PM  Result Value Ref Range   Troponin i, poc 0.00 0.00 - 0.08 ng/mL   Comment 3            Comment: Due to the release kinetics of cTnI, a negative result within the first hours of the onset of symptoms does not rule out myocardial infarction with certainty. If myocardial infarction is still suspected, repeat the test at appropriate intervals.   I-Stat CG4 Lactic Acid, ED     Status: None   Collection Time: 08/17/17  8:49 PM  Result Value Ref Range   Lactic Acid, Venous 0.53 0.5 - 1.9 mmol/L  I-Stat CG4 Lactic Acid, ED     Status: None   Collection Time: 08/17/17 11:31 PM  Result Value Ref Range   Lactic Acid, Venous 0.61 0.5 - 1.9 mmol/L  Ammonia     Status: None   Collection Time: 08/18/17 12:01 AM  Result Value Ref Range   Ammonia 25 9 - 35 umol/L  TSH     Status: Abnormal   Collection Time: 08/18/17 12:01 AM  Result Value Ref Range   TSH 0.320 (L) 0.350 - 4.500 uIU/mL    Comment: Performed by a 3rd Generation assay with a functional sensitivity of <=0.01 uIU/mL.  Vitamin B12     Status: Abnormal   Collection Time: 08/18/17 12:01 AM  Result Value Ref Range   Vitamin B-12 1,312 (  H) 180 - 914 pg/mL    Comment: (NOTE) This assay is not validated for testing neonatal or myeloproliferative syndrome specimens for Vitamin B12 levels. Performed at [facility], [place] [address], [place], [place] [address]   Sedimentation rate     Status: Abnormal   Collection Time: 08/18/17 12:01 AM  Result Value Ref Range   Sed Rate 61 (H) 0 - 16 mm/hr  RPR     Status: None   Collection Time: 08/18/17 12:01 AM  Result Value Ref Range   RPR Ser Ql Non Reactive Non Reactive    Comment: (NOTE) Performed At: [facility] [address] [place], [place] [id number] Lindon Romp MD ES:[id number]   Strep pneumoniae urinary antigen     Status:  None   Collection Time: 08/18/17 12:02 AM  Result Value Ref Range   Strep Pneumo Urinary Antigen NEGATIVE NEGATIVE    Comment:        Infection due to S. pneumoniae cannot be absolutely ruled out since the antigen present may be below the detection limit of the test. Performed at [facility], [address], [place], [place] [address]   Culture, blood (routine x 2)     Status: None (Preliminary result)   Collection Time: 08/18/17 12:03 AM  Result Value Ref Range   Specimen Description BLOOD LEFT ARM    Special Requests      BOTTLES DRAWN AEROBIC AND ANAEROBIC Blood Culture adequate volume   Culture      NO GROWTH 1 DAY Performed at [facility], [place] [address] [place], [place] [address]    Report Status PENDING   Acetaminophen level     Status: Abnormal   Collection Time: 08/18/17 12:06 AM  Result Value Ref Range   Acetaminophen (Tylenol), Serum <10 (L) 10 - 30 ug/mL    Comment:        THERAPEUTIC CONCENTRATIONS VARY SIGNIFICANTLY. A RANGE OF 10-30 ug/mL MAY BE AN EFFECTIVE CONCENTRATION FOR MANY PATIENTS. HOWEVER, SOME ARE BEST TREATED AT CONCENTRATIONS OUTSIDE THIS RANGE. ACETAMINOPHEN CONCENTRATIONS >150 ug/mL AT 4 HOURS AFTER INGESTION AND >50 ug/mL AT 12 HOURS AFTER INGESTION ARE OFTEN ASSOCIATED WITH TOXIC REACTIONS.   Culture, blood (routine x 2)     Status: None (Preliminary result)   Collection Time: 08/18/17 12:08 AM  Result Value Ref Range   Specimen Description BLOOD LEFT ANTECUBITAL    Special Requests      BOTTLES DRAWN AEROBIC AND ANAEROBIC Blood Culture adequate volume   Culture      NO GROWTH 1 DAY Performed at [facility], [place] [address], [place], [place] [address]    Report Status PENDING   Comprehensive metabolic panel     Status: Abnormal   Collection Time: 08/19/17  6:40 AM  Result Value Ref Range   Sodium 140 135 - 145 mmol/L   Potassium 3.5 3.5 - 5.1 mmol/L   Chloride 106 101 - 111 mmol/L   CO2 27 22 - 32 mmol/L    Glucose, Bld 101 (H) 65 - 99 mg/dL   BUN <5 (L) 6 - 20 mg/dL   Creatinine, Ser 0.83 0.61 - 1.24 mg/dL   Calcium 8.7 (L) 8.9 - 10.3 mg/dL   Total Protein 6.4 (L) 6.5 - 8.1 g/dL   Albumin 2.9 (L) 3.5 - 5.0 g/dL   AST 76 (H) 15 - 41 U/L   ALT 69 (H) 17 - 63 U/L   Alkaline Phosphatase 41 38 -  126 U/L   Total Bilirubin 0.6 0.3 - 1.2 mg/dL   GFR calc non Af Amer >60 >60 mL/min   GFR calc Af Amer >60 >60 mL/min    Comment: (NOTE) The eGFR has been calculated using the CKD EPI equation. This calculation has not been validated in all clinical situations. eGFR's persistently <60 mL/min signify possible Chronic Kidney Disease.    Anion gap 7 5 - 15  CBC     Status: Abnormal   Collection Time: 08/19/17  6:40 AM  Result Value Ref Range   WBC 7.6 4.0 - 10.5 K/uL   RBC 3.84 (L) 4.22 - 5.81 MIL/uL   Hemoglobin 11.5 (L) 13.0 - 17.0 g/dL   HCT 33.2 (L) 39.0 - 52.0 %   MCV 86.5 78.0 - 100.0 fL   MCH 29.9 26.0 - 34.0 pg   MCHC 34.6 30.0 - 36.0 g/dL   RDW 12.7 11.5 - 15.5 %   Platelets 333 150 - 400 K/uL  CK     Status: Abnormal   Collection Time: 08/19/17  6:40 AM  Result Value Ref Range   Total CK 1,819 (H) 49 - 397 U/L    Current Facility-Administered Medications  Medication Dose Route Frequency Provider Last Rate Last Dose  . acetaminophen (TYLENOL) tablet 650 mg  650 mg Oral Q6H PRN Jani Gravel, MD   650 mg at 08/19/17 9480   Or  . acetaminophen (TYLENOL) suppository 650 mg  650 mg Rectal Q6H PRN Jani Gravel, MD      . amoxicillin-clavulanate (AUGMENTIN) 875-125 MG per tablet 1 tablet  1 tablet Oral Q12H Dessa Phi Chahn-Yang, DO   1 tablet at 08/19/17 1138  . diazepam (VALIUM) injection 5 mg  5 mg Intravenous Q8H PRN Dessa Phi Chahn-Yang, DO   5 mg at 08/19/17 1655  . enoxaparin (LOVENOX) injection 40 mg  40 mg Subcutaneous QHS Jani Gravel, MD   40 mg at 08/18/17 2147  . folic acid injection 1 mg  1 mg Intravenous Daily Jani Gravel, MD   1 mg at 08/18/17 1027  . LORazepam  (ATIVAN) injection 2-3 mg  2-3 mg Intravenous Q1H PRN Jani Gravel, MD   2 mg at 08/19/17 1138  . nicotine (NICODERM CQ - dosed in mg/24 hours) patch 21 mg  21 mg Transdermal Daily Lajean Saver, MD   21 mg at 08/19/17 0930  . thiamine (B-1) injection 100 mg  100 mg Intravenous Daily Jani Gravel, MD   100 mg at 08/18/17 1029    Musculoskeletal: Strength & Muscle Tone: within normal limits Gait & Station: unable to stand Patient leans: N/A  Psychiatric Specialty Exam: Physical Exam as per history and physical  ROS He has been drowsy and sleepy at this time. No Fever-chills, No Headache, No changes with Vision or hearing, reports vertigo No problems swallowing food or Liquids, No Chest pain, Cough or Shortness of Breath, No Abdominal pain, No Nausea or Vommitting, Bowel movements are regular, No Blood in stool or Urine, No dysuria, No new skin rashes or bruises, No new joints pains-aches,  No new weakness, tingling, numbness in any extremity, No recent weight gain or loss, No polyuria, polydypsia or polyphagia,  A full 10 point Review of Systems was done, except as stated above, all other Review of Systems were negative.  Blood pressure (!) 129/59, pulse 63, temperature 98.8 F (37.1 C), temperature source Oral, resp. rate 18, weight 75.5 kg (166 lb 7.2 oz), SpO2 93 %.Body mass index is 23.21  kg/m.  General Appearance: Guarded  Eye Contact:  Good  Speech:  Clear and Coherent  Volume:  Decreased  Mood:  Depressed  Affect:  Non-Congruent and Inappropriate  Thought Process:  Disorganized and Irrelevant  Orientation:  Full (Time, Place, and Person)  Thought Content:  Illogical and Rumination  Suicidal Thoughts:  No  Homicidal Thoughts:  No  Memory:  Immediate;   Fair Recent;   Poor Remote;   Good  Judgement:  Impaired  Insight:  Shallow  Psychomotor Activity:  Decreased  Concentration:  Concentration: Poor and Attention Span: Poor  Recall:  Poor  Fund of Knowledge:  Fair   Language:  Good  Akathisia:  Negative  Handed:  Right  AIMS (if indicated):     Assets:  Communication Skills Desire for Improvement Physical Health Social Support Talents/Skills Vocational/Educational  ADL's:  Impaired  Cognition:  Impaired,  Moderate  Sleep:        Treatment Plan Summary: 26 years old young male with diagnosis of Major Depressive Disorder, Opiate Use, Severe, Benzo Use Disorder, Severe, presented with intoxication of heroin and benzo's within few hours after discharged home and also found rhabdomyolysis. Patient has failed several rehabilitation treatments over the last ten years and his mother worried about him put in danger without intention of suicide.  Based on evaluation today he does not meet criteria of capacity to make his own medical decisions.  Continue involuntary commitment Safety precautions, safety sitter We will increase Zoloft 150 mg daily for depression and Remeron 15 mg at bedtime for insomnia Patient benefit from long term substance abuse rehabilitation - may be at [place] his home medication when medically stable Monitor of opioid and benzodiazepine withdrawal and CIWA protcol  Daily contact with patient to assess and evaluate symptoms and progress in treatment and Medication management  Disposition: Supportive therapy provided about ongoing stressors.  Ambrose Finland, MD 08/19/2017 12:32 PM

## 2017-08-19 NOTE — Progress Notes (Signed)
Pt asked to go to the bathroom to have a bowel movement, upon returning from the bathroom pt tried to put on personal clothes and was told to sit down so wrist restraints could be reapplied. Pt became very aggravated and security was called. Pt became physical with the staff while attempting to reapply restraints. Pt states his back was hurting and he doesn't want to be in the bed; MD on the floor and notified for pain med order. Pts wrist and ankle restraints reapplied with the help of security; a lap belt was also applied since pt was still agitated and shaking the bed. All restraints have been checked for comfort. Pts cell phone was taken to the nurses station since it was interfering with pt care.

## 2017-08-19 NOTE — Progress Notes (Addendum)
PROGRESS NOTE    Mark Strickland  ZOX:[id number] DOB: [birth year] DOA: 08/17/2017 PCP: Patient, No Pcp Per     Brief Narrative:  Mark Strickland is a 26 yo male with history of IV drug use, particularly heroin and also benzodiazepines and other substances, seizures, depression and anxietywho was recently admitted due to unresponsiveness at the side of the road with pinpoint pupils. He underwent workup for fevers, lumbar spine MRI was notable for myositis and rhabdomyolysis, and diagnosed also with possible aspiration pneumonia. On 8/31, he was discharged from the hospital AGAINST MEDICAL ADVICE. 4 hours later, he returned to the emergency department due to altered mental status.  Assessment & Plan:   Principal Problem:   Altered mental status Active Problems:   Elevated LFTs   Fever   Rhabdomyolysis   Encephalopathy   Acute toxic encephalopathy -Similar presentation to previous admission, likely secondary to continued drug use -CT head unremarkable for acute intracranial etiology -Improved today on my examination, however continues to be aggressive and agitated with staff -Was evaluated by psychiatry yesterday, was determined to not able to make medical decision -Involuntarily committed, paperwork filled out -Call psychiatry to reevaluate patient today -Haldol prn   Aspiration pneumonia -Change IV antibiotics to Augmentin -On room air -Blood cultures pending  Rhabdomyolysis, myositis with back pain -CK, improving with IV fluids -Toradol IV and ultram PO prn for pain   Elevated liver enzymes -Improving with IV fluids  History of drug use -Social worker consulted, patient refusing outpatient resources  Seizure disorder -Patient not on any antiepileptic  Tobacco abuse -Nicotine patch   DVT prophylaxis: lovenox Code Status: Full Family Communication: no family at bedside, left VM for mom Disposition Plan: pending psych re-eval   Consultants:   Psych  Procedures:    None   Antimicrobials:  Anti-infectives    Start     Dose/Rate Route Frequency Ordered Stop   08/19/17 1030  amoxicillin-clavulanate (AUGMENTIN) 875-125 MG per tablet 1 tablet     1 tablet Oral Every 12 hours 08/19/17 0945     08/18/17 2200  vancomycin (VANCOCIN) IVPB 1000 mg/200 mL premix  Status:  Discontinued     1,000 mg 200 mL/hr over 60 Minutes Intravenous Every 8 hours 08/18/17 2112 08/19/17 0945   08/18/17 1600  ceFEPIme (MAXIPIME) 1 g in dextrose 5 % 50 mL IVPB  Status:  Discontinued     1 g 100 mL/hr over 30 Minutes Intravenous Every 8 hours 08/18/17 1158 08/19/17 0945   08/18/17 0800  vancomycin (VANCOCIN) IVPB 1000 mg/200 mL premix  Status:  Discontinued     1,000 mg 200 mL/hr over 60 Minutes Intravenous Every 8 hours 08/18/17 0112 08/18/17 2112   08/18/17 0800  ceFEPIme (MAXIPIME) 2 g in dextrose 5 % 50 mL IVPB  Status:  Discontinued     2 g 100 mL/hr over 30 Minutes Intravenous Every 8 hours 08/18/17 0112 08/18/17 1158   08/18/17 0115  vancomycin (VANCOCIN) IVPB 1000 mg/200 mL premix     1,000 mg 200 mL/hr over 60 Minutes Intravenous  Once 08/18/17 0107 08/18/17 0650   08/18/17 0115  ceFEPIme (MAXIPIME) 2 g in dextrose 5 % 50 mL IVPB     2 g 100 mL/hr over 30 Minutes Intravenous  Once 08/18/17 0107 08/18/17 0322       Subjective: Patient sitting in chair, sitter at bedside. He states that he is feeling much better. His back pain has improved. He states that he remembers the course of his previous  hospitalization where he was found unresponsive. This time, he was at his mom's house, and EMS was called to his mom's house. He states that he is feeling well, wants to leave. However, he has been involuntarily committed as he was found to lack capacity to make medical decisions that was determined by psychiatry. Soon after my evaluation, security was called to patient's room as he continued to be very agitated.  Evaluated and spoke with patient again this afternoon, called  by RN due to aggression and agitation. Security is at bedside. Patient is in tears and stating that we are lying to him. He complains of back and hip pain. States he wants to call his daughter's mom, brother, his mother. He is currently on soft restraints.   Objective: Vitals:   08/18/17 2119 08/19/17 0450 08/19/17 0700 08/19/17 1015  BP: 102/84 133/62 132/63 (!) 129/59  Pulse: (!) 55 (!) 55 77 63  Resp: 20 18 17 18   Temp: 99.5 F (37.5 C) 98.9 F (37.2 C) 98.7 F (37.1 C) 98.8 F (37.1 C)  TempSrc: Oral Oral Oral Oral  SpO2: 100% 93%    Weight:  75.5 kg (166 lb 7.2 oz)      Intake/Output Summary (Last 24 hours) at 08/19/17 1314 Last data filed at 08/19/17 0849  Gross per 24 hour  Intake              500 ml  Output                0 ml  Net              500 ml   Filed Weights   08/19/17 0450  Weight: 75.5 kg (166 lb 7.2 oz)    Examination:  General exam: Appears calm and comfortable on my initial exam  Respiratory system: Clear to auscultation. Respiratory effort normal. Cardiovascular system: S1 & S2 heard, RRR. No JVD, murmurs, rubs, gallops or clicks. No pedal edema. Gastrointestinal system: Abdomen is nondistended, soft and nontender. No organomegaly or masses felt. Normal bowel sounds heard. Central nervous system: Alert and oriented. No focal neurological deficits. Extremities: Symmetric 5 x 5 power. Skin: No rashes, lesions or ulcers Psychiatry: Judgement and insight appear poor.   Data Reviewed: I have personally reviewed following labs and imaging studies  CBC:  Recent Labs Lab 08/14/17 1754 08/15/17 [id number] 08/16/17 0304 08/17/17 0514 08/17/17 1926 08/19/17 0640  WBC 9.2 7.2 9.2 7.4 7.6 7.6  NEUTROABS 7.7  --   --   --  5.2  --   HGB 12.4* 10.4* 10.9* 10.7* 11.1* 11.5*  HCT 37.9* 31.7* 32.5* 31.4* 32.4* 33.2*  MCV 91.5 90.3 88.6 87.7 85.7 86.5  PLT 258 184 189 209 251 333   Basic Metabolic Panel:  Recent Labs Lab 08/15/17 0614 08/16/17 0304  08/17/17 0514 08/17/17 1926 08/19/17 0640  NA 138 140 138 139 140  K 3.7 3.3* 3.5 3.8 3.5  CL 110 109 110 108 106  CO2 21* 25 22 24 27   GLUCOSE 118* 123* 105* 86 101*  BUN 21* 11 10 9  <5*  CREATININE 0.93 0.81 0.80 0.88 0.83  CALCIUM 7.6* 8.1* 8.3* 8.6* 8.7*  MG 1.5* 1.9 1.6*  --   --   PHOS 1.8*  --  1.6*  --   --    GFR: Estimated Creatinine Clearance: 142.4 mL/min (by C-G formula based on SCr of 0.83 mg/dL). Liver Function Tests:  Recent Labs Lab 08/15/17 [id number] 08/16/17 0304 08/17/17 0514 08/17/17  1926 08/19/17 0640  AST 444* 384* 265* 203* 76*  ALT 87* 91* 87* 94* 69*  ALKPHOS 39 40 40 47 41  BILITOT 0.6 0.7 0.8 0.5 0.6  PROT 5.9* 6.1* 5.9* 6.5 6.4*  ALBUMIN 3.0* 3.0* 2.8* 3.1* 2.9*   No results for input(s): LIPASE, AMYLASE in the last 168 hours.  Recent Labs Lab 08/14/17 2318 08/18/17 0001  AMMONIA 14 25   Coagulation Profile:  Recent Labs Lab 08/15/17 0029  INR 1.08   Cardiac Enzymes:  Recent Labs Lab 08/15/17 0614 08/16/17 0304 08/17/17 0514 08/17/17 1926 08/19/17 0640  CKTOTAL 15,727* 16,150* 0,981* 5,112* 1,819*   BNP (last 3 results) No results for input(s): PROBNP in the last 8760 hours. HbA1C: No results for input(s): HGBA1C in the last 72 hours. CBG: No results for input(s): GLUCAP in the last 168 hours. Lipid Profile: No results for input(s): CHOL, HDL, LDLCALC, TRIG, CHOLHDL, LDLDIRECT in the last 72 hours. Thyroid Function Tests:  Recent Labs  08/18/17 0001  TSH 0.320*   Anemia Panel:  Recent Labs  08/18/17 0001  VITAMINB12 1,312*   Sepsis Labs:  Recent Labs Lab 08/15/17 0029 08/17/17 0514 08/17/17 2049 08/17/17 2331  PROCALCITON 0.16 <0.10  --   --   LATICACIDVEN 1.1  --  0.53 0.61    Recent Results (from the past 240 hour(s))  CSF culture     Status: None   Collection Time: 08/15/17 12:04 AM  Result Value Ref Range Status   Specimen Description CSF  Final   Special Requests Normal  Final   Gram Stain    Final    NO ORGANISMS SEEN NO WBC SEEN CYTOSPIN Gram Stain Report Called to,Read Back By and Verified With: Larina Earthly [phone] @ 0130 BY J SCOTTON    Culture   Final    NO GROWTH 3 DAYS Performed at [facility], [address] [place], [place] [address]    Report Status 08/18/2017 FINAL  Final  Culture, blood (Routine X 2) w Reflex to ID Panel     Status: None (Preliminary result)   Collection Time: 08/15/17 12:20 AM  Result Value Ref Range Status   Specimen Description BLOOD RIGHT HAND  Final   Special Requests   Final    BOTTLES DRAWN AEROBIC AND ANAEROBIC Blood Culture adequate volume   Culture   Final    NO GROWTH 4 DAYS Performed at [facility], [address] [place], [place] [address]    Report Status PENDING  Incomplete  Culture, blood (Routine X 2) w Reflex to ID Panel     Status: None (Preliminary result)   Collection Time: 08/15/17 12:32 AM  Result Value Ref Range Status   Specimen Description BLOOD LEFT HAND  Final   Special Requests   Final    BOTTLES DRAWN AEROBIC AND ANAEROBIC Blood Culture adequate volume   Culture   Final    NO GROWTH 4 DAYS Performed at [facility], [address], [place], [place] [address]    Report Status PENDING  Incomplete  MRSA PCR Screening     Status: None   Collection Time: 08/15/17 12:50 PM  Result Value Ref Range Status   MRSA by PCR NEGATIVE NEGATIVE Final    Comment:        The GeneXpert MRSA Assay (FDA approved for NASAL specimens only), is one component of a comprehensive MRSA colonization surveillance program. It is not intended to diagnose MRSA infection nor to  guide or monitor treatment for MRSA infections.   Culture, blood (single) w Reflex to ID Panel     Status: None (Preliminary result)   Collection Time: 08/15/17  4:39 PM  Result Value Ref Range Status   Specimen Description BLOOD LEFT HAND  Final   Special Requests   Final    BOTTLES DRAWN AEROBIC ONLY Blood Culture adequate  volume   Culture   Final    NO GROWTH 4 DAYS Performed at [facility], [address] [place], [place] [address]    Report Status PENDING  Incomplete  Culture, blood (single) w Reflex to ID Panel     Status: None (Preliminary result)   Collection Time: 08/16/17  3:04 AM  Result Value Ref Range Status   Specimen Description BLOOD RIGHT ANTECUBITAL  Final   Special Requests   Final    BOTTLES DRAWN AEROBIC ONLY Blood Culture adequate volume   Culture   Final    NO GROWTH 3 DAYS Performed at [facility], [address], [place], [place] [address]    Report Status PENDING  Incomplete  Culture, blood (single) w Reflex to ID Panel     Status: None (Preliminary result)   Collection Time: 08/16/17  3:04 AM  Result Value Ref Range Status   Specimen Description BLOOD LEFT HAND  Final   Special Requests IN PEDIATRIC BOTTLE Blood Culture adequate volume  Final   Culture   Final    NO GROWTH 3 DAYS Performed at [facility], [address], [place], [place] [address]    Report Status PENDING  Incomplete  Culture, blood (routine x 2)     Status: None (Preliminary result)   Collection Time: 08/18/17 12:03 AM  Result Value Ref Range Status   Specimen Description BLOOD LEFT ARM  Final   Special Requests   Final    BOTTLES DRAWN AEROBIC AND ANAEROBIC Blood Culture adequate volume   Culture   Final    NO GROWTH 1 DAY Performed at [facility], [address] [place], [place] [address]    Report Status PENDING  Incomplete  Culture, blood (routine x 2)     Status: None (Preliminary result)   Collection Time: 08/18/17 12:08 AM  Result Value Ref Range Status   Specimen Description BLOOD LEFT ANTECUBITAL  Final   Special Requests   Final    BOTTLES DRAWN AEROBIC AND ANAEROBIC Blood Culture adequate volume   Culture   Final    NO GROWTH 1 DAY Performed at [facility], [address], [place], [place] [address]    Report Status PENDING  Incomplete        Radiology Studies: Ct Head Wo Contrast  Result Date: 08/18/2017 CLINICAL DATA:  Altered level of consciousness. History of drug abuse and seizures. EXAM: CT HEAD WITHOUT CONTRAST TECHNIQUE: Contiguous axial images were obtained from the base of the skull through the vertex without intravenous contrast. COMPARISON:  CT HEAD August 14, 2017 FINDINGS: BRAIN: No intraparenchymal hemorrhage, mass effect nor midline shift. The ventricles and sulci are normal. No acute large vascular territory infarcts. No abnormal extra-axial fluid collections. Basal cisterns are patent. VASCULAR: Unremarkable. SKULL/SOFT TISSUES: No skull fracture. No significant soft tissue swelling. ORBITS/SINUSES: The included ocular globes and orbital contents are normal.Soft tissue opacifies and expands the RIGHT maxillary sinus with RIGHT frontal and ethmoid sinusitis. Mild LEFT maxillary sinus mucosal thickening. OTHER: None. IMPRESSION: 1. Normal noncontrast CT HEAD. 2.  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 Electronically Signed   By: Awilda Metro M.D.   On: 08/18/2017 01:29      Scheduled Meds: . amoxicillin-clavulanate  1 tablet Oral Q12H  . enoxaparin (LOVENOX) injection  40 mg Subcutaneous QHS  . folic acid  1 mg Intravenous Daily  . nicotine  21 mg Transdermal Daily  . thiamine  100 mg Intravenous Daily   Continuous Infusions:   LOS: 2 days    Time spent: 40 minutes   Noralee Stain, DO [facility] [email or web address] Password [email or web address] 08/19/2017, 1:14 PM

## 2017-08-19 NOTE — Progress Notes (Addendum)
Pt became very agitated and wanted to leave; pt was reoriented and informed of IVC status until he is cleared by the MD. Pt given Ativan, Ambien, and Valium with no relief in his anxiety. Pt put on his clothes and tried to leave the room and security was called to help get pt back into the bed. MD was on the floor and gave order for restraints. Restraints applied and pt was assessed for comfort with restraints. Security was present and pt followed directions while restraints were placed. Pt has been explained to why he can not leave AMA and what IVC means. Pt has been informed on when he is calm and corporative that the restraints can be removed. Will continue to monitor pt and restraints. Soft non-violent restraints were placed with the help of security.

## 2017-08-19 NOTE — Progress Notes (Signed)
Pt is much more calm and corporative in the bed, pts ankle restraints released to give more freedom.

## 2017-08-19 NOTE — Clinical Social Work Note (Addendum)
Late entry:  SW notified by nursing around 7:30 am this morning of need for IVC request per Dr. Alvino Chapelhoi.  IVC papers completed, signed, notarized and [facility].  [facility] that [facility]. Pt seen earlier by Dr. Elsie SaasJonnalagadda, Psychiatrist who determined that patient lacks capacity to make medical decisions at this time.   Patient, at present, is trying to leave the facility and became agitated.  Security notified; patient current in restraints for safety.[place] here to serve patient.  Discussed with Dr. Alvino Chapelhoi- she wants patient reassessed by Psychiatry to determine if IVC should remain in place. Patient was assessed by SW yesterday prior to leaving AMA. He has refused any outside SA resources by Child psychotherapistocial Worker yesterday and refused resources.  .Recently completed a 3 month residential [place]pt [place] [facility] [place].  SW services to continue to follow and assist with d/c disposition as recommended by MD.  Lupita Leashonna T. Jaci LazierCrowder, [place] [phone]  (weekend coverage)

## 2017-08-20 DIAGNOSIS — R410 Disorientation, unspecified: Secondary | ICD-10-CM

## 2017-08-20 LAB — COMPREHENSIVE METABOLIC PANEL
ALK PHOS: 42 U/L (ref 38–126)
ALT: 57 U/L (ref 17–63)
AST: 44 U/L — AB (ref 15–41)
Albumin: 3.1 g/dL — ABNORMAL LOW (ref 3.5–5.0)
Anion gap: 9 (ref 5–15)
BILIRUBIN TOTAL: 0.4 mg/dL (ref 0.3–1.2)
BUN: 13 mg/dL (ref 6–20)
CALCIUM: 9.1 mg/dL (ref 8.9–10.3)
CO2: 25 mmol/L (ref 22–32)
CREATININE: 1.09 mg/dL (ref 0.61–1.24)
Chloride: 105 mmol/L (ref 101–111)
Glucose, Bld: 88 mg/dL (ref 65–99)
Potassium: 3.5 mmol/L (ref 3.5–5.1)
Sodium: 139 mmol/L (ref 135–145)
TOTAL PROTEIN: 7 g/dL (ref 6.5–8.1)

## 2017-08-20 LAB — CULTURE, BLOOD (ROUTINE X 2)
Culture: NO GROWTH
Culture: NO GROWTH
SPECIAL REQUESTS: ADEQUATE
SPECIAL REQUESTS: ADEQUATE

## 2017-08-20 LAB — CULTURE, BLOOD (SINGLE)
Culture: NO GROWTH
Special Requests: ADEQUATE

## 2017-08-20 LAB — HCV RNA QUANT
HCV Quantitative Log: 4.167 log10 IU/mL (ref >1.70)
HCV Quantitative: 14700 IU/mL (ref >50)

## 2017-08-20 MED ORDER — VITAMIN B-1 100 MG PO TABS
100.0000 mg | ORAL_TABLET | Freq: Every day | ORAL | Status: DC
  Administered 2017-08-20 – 2017-08-21 (×2): 100 mg via ORAL
  Filled 2017-08-20 (×2): qty 1

## 2017-08-20 MED ORDER — FOLIC ACID 1 MG PO TABS
1.0000 mg | ORAL_TABLET | Freq: Every day | ORAL | Status: DC
  Administered 2017-08-20 – 2017-08-21 (×2): 1 mg via ORAL
  Filled 2017-08-20 (×2): qty 1

## 2017-08-20 NOTE — Progress Notes (Signed)
Patient cooperative this morning, answering questions appropriately. Patient asked to shower. Sitter, this RN, and patient agree to remove 1 extremity restraint per hour, eat lunch, and if all goes well patient will shower after lunch.

## 2017-08-20 NOTE — Progress Notes (Signed)
All restraints removed. Patient cooperative. MD notified, order dc'd. Will continue to monitor.

## 2017-08-20 NOTE — Progress Notes (Signed)
PROGRESS NOTE    Mark Strickland  ZOX:[id number] DOB: [birth year] DOA: 08/17/2017 PCP: Patient, No Pcp Per     Brief Narrative:  Mark Strickland is a 26 yo male with history of IV drug use, particularly heroin and also benzodiazepines and other substances, seizures, depression and anxietywho was recently admitted due to unresponsiveness at the side of the road with pinpoint pupils. He underwent workup for fevers, lumbar spine MRI was notable for myositis and rhabdomyolysis, and diagnosed also with possible aspiration pneumonia. On 8/31, he was discharged from the hospital AGAINST MEDICAL ADVICE. 4 hours later, he returned to the emergency department due to altered mental status. Patient's mother had called EMS as patient was clearly intoxicated/high and was attempting to drive.   Assessment & Plan:   Principal Problem:   Altered mental status Active Problems:   Elevated LFTs   Fever   Rhabdomyolysis   Encephalopathy   Acute toxic encephalopathy -Similar presentation to previous admission, likely secondary to continued drug use -CT head unremarkable for acute intracranial etiology -Was evaluated by psychiatry yesterday, was determined incapable of making own medical decision -Involuntarily committed, paperwork filled out -Haldol prn for agitation  -CIWA and valium prn for anxiety, zoloft dose increased by psych   Aspiration pneumonia -On room air -Blood cultures negative to date  -Augmentin  Rhabdomyolysis, myositis with back pain -CK, improved with IV fluids -Toradol IV and ultram PO prn for pain   Elevated liver enzymes -Improving with IV fluids  History of drug use -Social worker consulted, patient refusing outpatient resources  Seizure disorder -Patient not on any antiepileptic  Tobacco abuse -Nicotine patch   DVT prophylaxis: lovenox Code Status: Full Family Communication: Spoke with mother over the phone 9/2. She is unable to visit patient in the hospital. She  states that it is true that she was in a car accident, but it has been over a week and she is fine. She also tells me that patient does not have any children (patient had told me yesterday that he needs to leave to see daughter). She agrees with his continued hospitalization and detox  Disposition Plan: pending improvement    Consultants:   Psych  Procedures:   None   Antimicrobials:  Anti-infectives    Start     Dose/Rate Route Frequency Ordered Stop   08/19/17 1030  amoxicillin-clavulanate (AUGMENTIN) 875-125 MG per tablet 1 tablet     1 tablet Oral Every 12 hours 08/19/17 0945 08/24/17 0959   08/18/17 2200  vancomycin (VANCOCIN) IVPB 1000 mg/200 mL premix  Status:  Discontinued     1,000 mg 200 mL/hr over 60 Minutes Intravenous Every 8 hours 08/18/17 2112 08/19/17 0945   08/18/17 1600  ceFEPIme (MAXIPIME) 1 g in dextrose 5 % 50 mL IVPB  Status:  Discontinued     1 g 100 mL/hr over 30 Minutes Intravenous Every 8 hours 08/18/17 1158 08/19/17 0945   08/18/17 0800  vancomycin (VANCOCIN) IVPB 1000 mg/200 mL premix  Status:  Discontinued     1,000 mg 200 mL/hr over 60 Minutes Intravenous Every 8 hours 08/18/17 0112 08/18/17 2112   08/18/17 0800  ceFEPIme (MAXIPIME) 2 g in dextrose 5 % 50 mL IVPB  Status:  Discontinued     2 g 100 mL/hr over 30 Minutes Intravenous Every 8 hours 08/18/17 0112 08/18/17 1158   08/18/17 0115  vancomycin (VANCOCIN) IVPB 1000 mg/200 mL premix     1,000 mg 200 mL/hr over 60 Minutes Intravenous  Once 08/18/17 0107  08/18/17 0650   08/18/17 0115  ceFEPIme (MAXIPIME) 2 g in dextrose 5 % 50 mL IVPB     2 g 100 mL/hr over 30 Minutes Intravenous  Once 08/18/17 0107 08/18/17 0322       Subjective: Patient is sitting in bed, eating breakfast. No acute events or complaint. He wants to get out of bed to shower. Denies any worsening pain. Continues to have some lower back pain.  Objective: Vitals:   08/19/17 1412 08/19/17 1955 08/20/17 0502 08/20/17 0540  BP:  138/74 128/75  138/72  Pulse: (!) 51 (!) 51  (!) 54  Resp: 18 18  18   Temp: 99.2 F (37.3 C) 98.1 F (36.7 C)  98.2 F (36.8 C)  TempSrc: Oral   Oral  SpO2: 100% 97%  100%  Weight:   75.5 kg (166 lb 7.2 oz)     Intake/Output Summary (Last 24 hours) at 08/20/17 1141 Last data filed at 08/20/17 0846  Gross per 24 hour  Intake             1020 ml  Output             1901 ml  Net             -881 ml   Filed Weights   08/19/17 0450 08/20/17 0502  Weight: 75.5 kg (166 lb 7.2 oz) 75.5 kg (166 lb 7.2 oz)    Examination:  General exam: Appears calm and comfortable on my exam  Respiratory system: Clear to auscultation. Respiratory effort normal. Cardiovascular system: S1 & S2 heard, RRR. No JVD, murmurs, rubs, gallops or clicks. No pedal edema. Gastrointestinal system: Abdomen is nondistended, soft and nontender. No organomegaly or masses felt. Normal bowel sounds heard. Central nervous system: Alert and oriented. No focal neurological deficits. Extremities: Symmetric 5 x 5 power. Skin: No rashes, lesions or ulcers Psychiatry: Judgement and insight appear poor. Anxious   Data Reviewed: I have personally reviewed following labs and imaging studies  CBC:  Recent Labs Lab 08/14/17 1754 08/15/17 1610 08/16/17 0304 08/17/17 0514 08/17/17 1926 08/19/17 0640  WBC 9.2 7.2 9.2 7.4 7.6 7.6  NEUTROABS 7.7  --   --   --  5.2  --   HGB 12.4* 10.4* 10.9* 10.7* 11.1* 11.5*  HCT 37.9* 31.7* 32.5* 31.4* 32.4* 33.2*  MCV 91.5 90.3 88.6 87.7 85.7 86.5  PLT 258 184 189 209 251 333   Basic Metabolic Panel:  Recent Labs Lab 08/15/17 0614 08/16/17 0304 08/17/17 0514 08/17/17 1926 08/19/17 0640 08/20/17 0738  NA 138 140 138 139 140 139  K 3.7 3.3* 3.5 3.8 3.5 3.5  CL 110 109 110 108 106 105  CO2 21* 25 22 24 27 25   GLUCOSE 118* 123* 105* 86 101* 88  BUN 21* 11 10 9  <5* 13  CREATININE 0.93 0.81 0.80 0.88 0.83 1.09  CALCIUM 7.6* 8.1* 8.3* 8.6* 8.7* 9.1  MG 1.5* 1.9 1.6*  --   --    --   PHOS 1.8*  --  1.6*  --   --   --    GFR: Estimated Creatinine Clearance: 108.4 mL/min (by C-G formula based on SCr of 1.09 mg/dL). Liver Function Tests:  Recent Labs Lab 08/16/17 0304 08/17/17 0514 08/17/17 1926 08/19/17 0640 08/20/17 0738  AST 384* 265* 203* 76* 44*  ALT 91* 87* 94* 69* 57  ALKPHOS 40 40 47 41 42  BILITOT 0.7 0.8 0.5 0.6 0.4  PROT 6.1* 5.9* 6.5 6.4* 7.0  ALBUMIN 3.0* 2.8* 3.1* 2.9* 3.1*   No results for input(s): LIPASE, AMYLASE in the last 168 hours.  Recent Labs Lab 08/14/17 2318 08/18/17 0001  AMMONIA 14 25   Coagulation Profile:  Recent Labs Lab 08/15/17 0029  INR 1.08   Cardiac Enzymes:  Recent Labs Lab 08/15/17 0614 08/16/17 0304 08/17/17 0514 08/17/17 1926 08/19/17 0640  CKTOTAL 15,727* 16,150* 1,610* 5,112* 1,819*   BNP (last 3 results) No results for input(s): PROBNP in the last 8760 hours. HbA1C: No results for input(s): HGBA1C in the last 72 hours. CBG: No results for input(s): GLUCAP in the last 168 hours. Lipid Profile: No results for input(s): CHOL, HDL, LDLCALC, TRIG, CHOLHDL, LDLDIRECT in the last 72 hours. Thyroid Function Tests:  Recent Labs  08/18/17 0001  TSH 0.320*   Anemia Panel:  Recent Labs  08/18/17 0001  VITAMINB12 1,312*   Sepsis Labs:  Recent Labs Lab 08/15/17 0029 08/17/17 0514 08/17/17 2049 08/17/17 2331  PROCALCITON 0.16 <0.10  --   --   LATICACIDVEN 1.1  --  0.53 0.61    Recent Results (from the past 240 hour(s))  CSF culture     Status: None   Collection Time: 08/15/17 12:04 AM  Result Value Ref Range Status   Specimen Description CSF  Final   Special Requests Normal  Final   Gram Stain   Final    NO ORGANISMS SEEN NO WBC SEEN CYTOSPIN Gram Stain Report Called to,Read Back By and Verified With: Larina Earthly [phone] @ 0130 BY J SCOTTON    Culture   Final    NO GROWTH 3 DAYS Performed at [facility], [address], [place], [place] [address]    Report Status  08/18/2017 FINAL  Final  Culture, blood (Routine X 2) w Reflex to ID Panel     Status: None   Collection Time: 08/15/17 12:20 AM  Result Value Ref Range Status   Specimen Description BLOOD RIGHT HAND  Final   Special Requests   Final    BOTTLES DRAWN AEROBIC AND ANAEROBIC Blood Culture adequate volume   Culture   Final    NO GROWTH 5 DAYS Performed at [facility], [address] [place], [place] [address]    Report Status 08/20/2017 FINAL  Final  Culture, blood (Routine X 2) w Reflex to ID Panel     Status: None   Collection Time: 08/15/17 12:32 AM  Result Value Ref Range Status   Specimen Description BLOOD LEFT HAND  Final   Special Requests   Final    BOTTLES DRAWN AEROBIC AND ANAEROBIC Blood Culture adequate volume   Culture   Final    NO GROWTH 5 DAYS Performed at [facility], [address], [place], [place] [address]    Report Status 08/20/2017 FINAL  Final  MRSA PCR Screening     Status: None   Collection Time: 08/15/17 12:50 PM  Result Value Ref Range Status   MRSA by PCR NEGATIVE NEGATIVE Final    Comment:        The GeneXpert MRSA Assay (FDA approved for NASAL specimens only), is one component of a comprehensive MRSA colonization surveillance program. It is not intended to diagnose MRSA infection nor to guide or monitor treatment for MRSA infections.   Culture, blood (single) w Reflex to ID Panel     Status: None   Collection Time: 08/15/17  4:39 PM  Result Value Ref Range Status   Specimen Description  BLOOD LEFT HAND  Final   Special Requests   Final    BOTTLES DRAWN AEROBIC ONLY Blood Culture adequate volume   Culture   Final    NO GROWTH 5 DAYS Performed at [facility], [address] [place], [place] [address]    Report Status 08/20/2017 FINAL  Final  Culture, blood (single) w Reflex to ID Panel     Status: None (Preliminary result)   Collection Time: 08/16/17  3:04 AM  Result Value Ref Range Status   Specimen Description BLOOD  RIGHT ANTECUBITAL  Final   Special Requests   Final    BOTTLES DRAWN AEROBIC ONLY Blood Culture adequate volume   Culture   Final    NO GROWTH 4 DAYS Performed at [facility], [address] [place], [place] [address]    Report Status PENDING  Incomplete  Culture, blood (single) w Reflex to ID Panel     Status: None (Preliminary result)   Collection Time: 08/16/17  3:04 AM  Result Value Ref Range Status   Specimen Description BLOOD LEFT HAND  Final   Special Requests IN PEDIATRIC BOTTLE Blood Culture adequate volume  Final   Culture   Final    NO GROWTH 4 DAYS Performed at [facility], [address] [place], [place] [address]    Report Status PENDING  Incomplete  Culture, blood (routine x 2)     Status: None (Preliminary result)   Collection Time: 08/18/17 12:03 AM  Result Value Ref Range Status   Specimen Description BLOOD LEFT ARM  Final   Special Requests   Final    BOTTLES DRAWN AEROBIC AND ANAEROBIC Blood Culture adequate volume   Culture   Final    NO GROWTH 2 DAYS Performed at [facility], [address] [place], [place] [address]    Report Status PENDING  Incomplete  Culture, blood (routine x 2)     Status: None (Preliminary result)   Collection Time: 08/18/17 12:08 AM  Result Value Ref Range Status   Specimen Description BLOOD LEFT ANTECUBITAL  Final   Special Requests   Final    BOTTLES DRAWN AEROBIC AND ANAEROBIC Blood Culture adequate volume   Culture   Final    NO GROWTH 2 DAYS Performed at [facility], [address] [place], [place] [address]    Report Status PENDING  Incomplete       Radiology Studies: No results found.    Scheduled Meds: . amoxicillin-clavulanate  1 tablet Oral Q12H  . enoxaparin (LOVENOX) injection  40 mg Subcutaneous QHS  . folic acid  1 mg Oral Daily  . gabapentin  300 mg Oral TID  . mirtazapine  15 mg Oral QHS  . nicotine  21 mg Transdermal Daily  . sertraline  150 mg Oral Daily  . thiamine   100 mg Oral Daily   Continuous Infusions:   LOS: 3 days    Time spent: 30 minutes   Noralee StainJennifer Oney Folz, DO [facility] [email or web address] Password [email or web address] 08/20/2017, 11:41 AM

## 2017-08-20 NOTE — Progress Notes (Signed)
PHARMACIST - PHYSICIAN COMMUNICATION  DR:   TRH  CONCERNING: IV to Oral Route Change Policy  RECOMMENDATION: This patient is receiving folate and thiamine by the intravenous route.  Based on criteria approved by the Pharmacy and Therapeutics Committee, the intravenous medication(s) is/are being converted to the equivalent oral dose form(s).   DESCRIPTION: These criteria include:  The patient is eating (either orally or via tube) and/or has been taking other orally administered medications for a least 24 hours  The patient has no evidence of active gastrointestinal bleeding or impaired GI absorption (gastrectomy, short bowel, patient on TNA or NPO).  If you have questions about this conversion, please contact the Pharmacy Department  []   [phone]( [phone] )  [facility] []   [phone]( [phone] )  [facility] []   [phone]( [phone] )  [facility] []   [phone]( [phone] )  [facility] [x]   [phone]( [phone] )  [facility]   Juliette Alcideustin Zeigler, PharmD, BCPS.   Pager: [phone] 08/20/2017 7:28 AM

## 2017-08-21 DIAGNOSIS — T50901A Poisoning by unspecified drugs, medicaments and biological substances, accidental (unintentional), initial encounter: Secondary | ICD-10-CM

## 2017-08-21 LAB — CULTURE, BLOOD (SINGLE)
CULTURE: NO GROWTH
CULTURE: NO GROWTH
SPECIAL REQUESTS: ADEQUATE
Special Requests: ADEQUATE

## 2017-08-21 LAB — COMPREHENSIVE METABOLIC PANEL
ALK PHOS: 44 U/L (ref 38–126)
ALT: 50 U/L (ref 17–63)
ANION GAP: 8 (ref 5–15)
AST: 44 U/L — ABNORMAL HIGH (ref 15–41)
Albumin: 3.2 g/dL — ABNORMAL LOW (ref 3.5–5.0)
BILIRUBIN TOTAL: 0.5 mg/dL (ref 0.3–1.2)
BUN: 11 mg/dL (ref 6–20)
CALCIUM: 8.9 mg/dL (ref 8.9–10.3)
CO2: 25 mmol/L (ref 22–32)
CREATININE: 1.04 mg/dL (ref 0.61–1.24)
Chloride: 106 mmol/L (ref 101–111)
GFR calc non Af Amer: >60 mL/min (ref >60)
GLUCOSE: 88 mg/dL (ref 65–99)
Potassium: 3.7 mmol/L (ref 3.5–5.1)
Sodium: 139 mmol/L (ref 135–145)
TOTAL PROTEIN: 6.9 g/dL (ref 6.5–8.1)

## 2017-08-21 LAB — HEPATITIS C GENOTYPE

## 2017-08-21 LAB — CK: Total CK: 562 U/L — ABNORMAL HIGH (ref 49–397)

## 2017-08-21 MED ORDER — SERTRALINE HCL 50 MG PO TABS: 150.0000 mg | ORAL_TABLET | Freq: Every day | ORAL | 0 refills | Status: DC

## 2017-08-21 MED ORDER — MIRTAZAPINE 15 MG PO TABS
15.0000 mg | ORAL_TABLET | Freq: Every day | ORAL | 0 refills | Status: DC
Start: 2017-08-21 — End: 2020-10-13

## 2017-08-21 NOTE — Progress Notes (Signed)
PROGRESS NOTE    Mark Strickland  ZOX:[id number] DOB: 25-Jan-1991 DOA: 08/17/2017 PCP: Patient, No Pcp Per     Brief Narrative:  Mark Strickland is a 26 yo male with history of IV drug use, particularly heroin and also benzodiazepines and other substances, seizures, depression and anxietywho was recently admitted due to unresponsiveness at the side of the road with pinpoint pupils. He underwent workup for fevers, lumbar spine MRI was notable for myositis and rhabdomyolysis, and diagnosed also with possible aspiration pneumonia. On 8/31, he was discharged from the hospital AGAINST MEDICAL ADVICE. 4 hours later, he returned to the emergency department due to altered mental status. Patient's mother had called EMS as patient was clearly intoxicated/high and was attempting to drive.   Assessment & Plan:   Principal Problem:   Altered mental status Active Problems:   Elevated LFTs   Fever   Rhabdomyolysis   Encephalopathy   Acute toxic encephalopathy -Similar presentation to previous admission, likely secondary to continued drug use -CT head unremarkable for acute intracranial etiology -Was evaluated by psychiatry 8/31 and 9/1, was determined incapable of making own medical decision -Involuntarily committed, paperwork filled out -Haldol prn for agitation  -CIWA and valium prn for anxiety, zoloft dose increased by psych  -Much improved today, calm and off restraints   Aspiration pneumonia -On room air -Blood cultures negative to date  -Augmentin x 5 days   Rhabdomyolysis, myositis with back pain -CK, improved with supportive care  -Toradol IV and ultram PO prn for pain   Elevated liver enzymes -Improved  History of drug use -Social worker consulted   Seizure disorder -Patient not on any antiepileptic  Tobacco abuse -Nicotine patch   DVT prophylaxis: lovenox Code Status: Full Family Communication: Spoke with mother over the phone 9/2. She is unable to visit patient in the  hospital. She states that it is true that she was in a car accident, but it has been over a week and she is fine. She also tells me that patient does not have any children (patient had told me yesterday that he needs to leave to see daughter). She agrees with his continued hospitalization and detox   Disposition Plan: medically improving. Need discharge plan, SW looking into [facility]    Consultants:   Psych  Procedures:   None   Antimicrobials:  Anti-infectives    Start     Dose/Rate Route Frequency Ordered Stop   08/19/17 1030  amoxicillin-clavulanate (AUGMENTIN) 875-125 MG per tablet 1 tablet     1 tablet Oral Every 12 hours 08/19/17 0945 08/24/17 0959   08/18/17 2200  vancomycin (VANCOCIN) IVPB 1000 mg/200 mL premix  Status:  Discontinued     1,000 mg 200 mL/hr over 60 Minutes Intravenous Every 8 hours 08/18/17 2112 08/19/17 0945   08/18/17 1600  ceFEPIme (MAXIPIME) 1 g in dextrose 5 % 50 mL IVPB  Status:  Discontinued     1 g 100 mL/hr over 30 Minutes Intravenous Every 8 hours 08/18/17 1158 08/19/17 0945   08/18/17 0800  vancomycin (VANCOCIN) IVPB 1000 mg/200 mL premix  Status:  Discontinued     1,000 mg 200 mL/hr over 60 Minutes Intravenous Every 8 hours 08/18/17 0112 08/18/17 2112   08/18/17 0800  ceFEPIme (MAXIPIME) 2 g in dextrose 5 % 50 mL IVPB  Status:  Discontinued     2 g 100 mL/hr over 30 Minutes Intravenous Every 8 hours 08/18/17 0112 08/18/17 1158   08/18/17 0115  vancomycin (VANCOCIN) IVPB 1000 mg/200 mL  premix     1,000 mg 200 mL/hr over 60 Minutes Intravenous  Once 08/18/17 0107 08/18/17 0650   08/18/17 0115  ceFEPIme (MAXIPIME) 2 g in dextrose 5 % 50 mL IVPB     2 g 100 mL/hr over 30 Minutes Intravenous  Once 08/18/17 0107 08/18/17 0322       Subjective: No complaints today. Patient is calm and collected. He states that he has a friend who may have a halfway house that patient could go to. He is looking into this.  Objective: Vitals:   08/20/17 0540  08/20/17 1330 08/20/17 1950 08/21/17 0534  BP: 138/72 119/66 132/65 100/60  Pulse: (!) 54 (!) 56 79 72  Resp: 18 18 18 18   Temp: 98.2 F (36.8 C) 98.7 F (37.1 C) 98.6 F (37 C) 98.3 F (36.8 C)  TempSrc: Oral Oral Oral Oral  SpO2: 100% 99% 99% 99%  Weight:    75.4 kg (166 lb 3.6 oz)    Intake/Output Summary (Last 24 hours) at 08/21/17 1154 Last data filed at 08/20/17 1600  Gross per 24 hour  Intake              240 ml  Output                0 ml  Net              240 ml   Filed Weights   08/19/17 0450 08/20/17 0502 08/21/17 0534  Weight: 75.5 kg (166 lb 7.2 oz) 75.5 kg (166 lb 7.2 oz) 75.4 kg (166 lb 3.6 oz)    Examination:  General exam: Appears calm and comfortable on my exam  Respiratory system: Clear to auscultation. Respiratory effort normal. Cardiovascular system: S1 & S2 heard, RRR. No JVD, murmurs, rubs, gallops or clicks. No pedal edema. Gastrointestinal system: Abdomen is nondistended, soft and nontender. No organomegaly or masses felt. Normal bowel sounds heard. Central nervous system: Alert and oriented. No focal neurological deficits. Extremities: Symmetric 5 x 5 power. Skin: No rashes, lesions or ulcers Psychiatry: Judgement and insight appear poor.   Data Reviewed: I have personally reviewed following labs and imaging studies  CBC:  Recent Labs Lab 08/14/17 1754 08/15/17 1610 08/16/17 0304 08/17/17 0514 08/17/17 1926 08/19/17 0640  WBC 9.2 7.2 9.2 7.4 7.6 7.6  NEUTROABS 7.7  --   --   --  5.2  --   HGB 12.4* 10.4* 10.9* 10.7* 11.1* 11.5*  HCT 37.9* 31.7* 32.5* 31.4* 32.4* 33.2*  MCV 91.5 90.3 88.6 87.7 85.7 86.5  PLT 258 184 189 209 251 333   Basic Metabolic Panel:  Recent Labs Lab 08/15/17 0614 08/16/17 0304 08/17/17 0514 08/17/17 1926 08/19/17 0640 08/20/17 0738 08/21/17 0529  NA 138 140 138 139 140 139 139  K 3.7 3.3* 3.5 3.8 3.5 3.5 3.7  CL 110 109 110 108 106 105 106  CO2 21* 25 22 24 27 25 25   GLUCOSE 118* 123* 105* 86 101*  88 88  BUN 21* 11 10 9  <5* 13 11  CREATININE 0.93 0.81 0.80 0.88 0.83 1.09 1.04  CALCIUM 7.6* 8.1* 8.3* 8.6* 8.7* 9.1 8.9  MG 1.5* 1.9 1.6*  --   --   --   --   PHOS 1.8*  --  1.6*  --   --   --   --    GFR: Estimated Creatinine Clearance: 113.6 mL/min (by C-G formula based on SCr of 1.04 mg/dL). Liver Function Tests:  Recent Labs  Lab 08/17/17 0514 08/17/17 1926 08/19/17 0640 08/20/17 0738 08/21/17 0529  AST 265* 203* 76* 44* 44*  ALT 87* 94* 69* 57 50  ALKPHOS 40 47 41 42 44  BILITOT 0.8 0.5 0.6 0.4 0.5  PROT 5.9* 6.5 6.4* 7.0 6.9  ALBUMIN 2.8* 3.1* 2.9* 3.1* 3.2*   No results for input(s): LIPASE, AMYLASE in the last 168 hours.  Recent Labs Lab 08/14/17 2318 08/18/17 0001  AMMONIA 14 25   Coagulation Profile:  Recent Labs Lab 08/15/17 0029  INR 1.08   Cardiac Enzymes:  Recent Labs Lab 08/16/17 0304 08/17/17 0514 08/17/17 1926 08/19/17 0640 08/21/17 0529  CKTOTAL 16,150* 8,695* 5,112* 1,819* 562*   BNP (last 3 results) No results for input(s): PROBNP in the last 8760 hours. HbA1C: No results for input(s): HGBA1C in the last 72 hours. CBG: No results for input(s): GLUCAP in the last 168 hours. Lipid Profile: No results for input(s): CHOL, HDL, LDLCALC, TRIG, CHOLHDL, LDLDIRECT in the last 72 hours. Thyroid Function Tests: No results for input(s): TSH, T4TOTAL, FREET4, T3FREE, THYROIDAB in the last 72 hours. Anemia Panel: No results for input(s): VITAMINB12, FOLATE, FERRITIN, TIBC, IRON, RETICCTPCT in the last 72 hours. Sepsis Labs:  Recent Labs Lab 08/15/17 0029 08/17/17 0514 08/17/17 2049 08/17/17 2331  PROCALCITON 0.16 <0.10  --   --   LATICACIDVEN 1.1  --  0.53 0.61    Recent Results (from the past 240 hour(s))  CSF culture     Status: None   Collection Time: 08/15/17 12:04 AM  Result Value Ref Range Status   Specimen Description CSF  Final   Special Requests Normal  Final   Gram Stain   Final    NO ORGANISMS SEEN NO WBC  SEEN CYTOSPIN Gram Stain Report Called to,Read Back By and Verified With: Larina Earthly [phone] @ 0130 BY J SCOTTON    Culture   Final    NO GROWTH 3 DAYS Performed at [facility], [address] [place], [place] [address]    Report Status 08/18/2017 FINAL  Final  Culture, blood (Routine X 2) w Reflex to ID Panel     Status: None   Collection Time: 08/15/17 12:20 AM  Result Value Ref Range Status   Specimen Description BLOOD RIGHT HAND  Final   Special Requests   Final    BOTTLES DRAWN AEROBIC AND ANAEROBIC Blood Culture adequate volume   Culture   Final    NO GROWTH 5 DAYS Performed at [facility], [address], [place], [place] [address]    Report Status 08/20/2017 FINAL  Final  Culture, blood (Routine X 2) w Reflex to ID Panel     Status: None   Collection Time: 08/15/17 12:32 AM  Result Value Ref Range Status   Specimen Description BLOOD LEFT HAND  Final   Special Requests   Final    BOTTLES DRAWN AEROBIC AND ANAEROBIC Blood Culture adequate volume   Culture   Final    NO GROWTH 5 DAYS Performed at [facility], [address] [place], [place] [address]    Report Status 08/20/2017 FINAL  Final  MRSA PCR Screening     Status: None   Collection Time: 08/15/17 12:50 PM  Result Value Ref Range Status   MRSA by PCR NEGATIVE NEGATIVE Final    Comment:        The GeneXpert MRSA Assay (FDA approved for NASAL specimens only), is one component of a comprehensive MRSA  colonization surveillance program. It is not intended to diagnose MRSA infection nor to guide or monitor treatment for MRSA infections.   Culture, blood (single) w Reflex to ID Panel     Status: None   Collection Time: 08/15/17  4:39 PM  Result Value Ref Range Status   Specimen Description BLOOD LEFT HAND  Final   Special Requests   Final    BOTTLES DRAWN AEROBIC ONLY Blood Culture adequate volume   Culture   Final    NO GROWTH 5 DAYS Performed at [facility], [address] [place], [place] [address]    Report Status 08/20/2017 FINAL  Final  Culture, blood (single) w Reflex to ID Panel     Status: None   Collection Time: 08/16/17  3:04 AM  Result Value Ref Range Status   Specimen Description BLOOD RIGHT ANTECUBITAL  Final   Special Requests   Final    BOTTLES DRAWN AEROBIC ONLY Blood Culture adequate volume   Culture   Final    NO GROWTH 5 DAYS Performed at [facility], [address] [place], [place] [address]    Report Status 08/21/2017 FINAL  Final  Culture, blood (single) w Reflex to ID Panel     Status: None   Collection Time: 08/16/17  3:04 AM  Result Value Ref Range Status   Specimen Description BLOOD LEFT HAND  Final   Special Requests IN PEDIATRIC BOTTLE Blood Culture adequate volume  Final   Culture   Final    NO GROWTH 5 DAYS Performed at [facility], [address] [place], [place] [address]    Report Status 08/21/2017 FINAL  Final  Culture, blood (routine x 2)     Status: None (Preliminary result)   Collection Time: 08/18/17 12:03 AM  Result Value Ref Range Status   Specimen Description BLOOD LEFT ARM  Final   Special Requests   Final    BOTTLES DRAWN AEROBIC AND ANAEROBIC Blood Culture adequate volume   Culture   Final    NO GROWTH 3 DAYS Performed at [facility], [address] [place], [place] [address]    Report Status PENDING  Incomplete  Culture, blood (routine x 2)     Status: None (Preliminary result)   Collection Time: 08/18/17 12:08 AM  Result Value Ref Range Status   Specimen Description BLOOD LEFT ANTECUBITAL  Final   Special Requests   Final    BOTTLES DRAWN AEROBIC AND ANAEROBIC Blood Culture adequate volume   Culture   Final    NO GROWTH 3 DAYS Performed at [facility], [address] [place], [place] [address]    Report Status PENDING  Incomplete       Radiology Studies: No results found.    Scheduled Meds: . amoxicillin-clavulanate  1 tablet Oral Q12H  . enoxaparin  (LOVENOX) injection  40 mg Subcutaneous QHS  . folic acid  1 mg Oral Daily  . gabapentin  300 mg Oral TID  . mirtazapine  15 mg Oral QHS  . nicotine  21 mg Transdermal Daily  . sertraline  150 mg Oral Daily  . thiamine  100 mg Oral Daily   Continuous Infusions:   LOS: 4 days    Time spent: 30 minutes   Noralee StainJennifer Kessie Croston, DO [facility] [email or web address] Password [email or web address] 08/21/2017, 11:54 AM

## 2017-08-21 NOTE — Discharge Summary (Signed)
Physician Discharge Summary  Burnett Spray ZOX:[id number] DOB: 04/16/1991 DOA: 08/17/2017  PCP: Patient, No Pcp Per  Admit date: 08/17/2017 Discharge date: 08/21/2017  Admitted From: Home Disposition:  Home  Recommendations for Outpatient Follow-up:  1. Follow up with PCP in 1 week. [facility] to sign up for services, including PCP, medication assistance, housing resources   Discharge Condition: Stable CODE STATUS: Full  Diet recommendation: General   Brief/Interim Summary: Mark Strickland is a 26 yo male with history of IV drug use, particularly heroin and also benzodiazepines and other substances, seizures, depression and anxietywho was recently admitted due to unresponsiveness at the side of the road with pinpoint pupils. He underwent workup for fevers, lumbar spine MRI was notable for myositis and rhabdomyolysis, and diagnosed also with possible aspiration pneumonia. On 8/31, he was discharged from the hospital AGAINST MEDICAL ADVICE. 4 hours later, he returned to the emergency department due to altered mental status. Patient's mother had called EMS as patient was clearly intoxicated/high and was attempting to drive. Patient was evaluated by psychiatry and involuntarily committed for patient safety. He was treated medically for drug intoxication, withdrawal, as well as disorientation and agitation. Ck levels improved with IVF and he was also treated for aspiration pneumonia from previous hospitalization. On 9/3, patient was re-evaluated by psychiatry and IVC was rescinded. He was provided with resources for rehabilitation and encouraged to stop drug use.   Discharge Diagnoses:  Principal Problem:   Altered mental status Active Problems:   Elevated LFTs   Fever   Rhabdomyolysis   Encephalopathy  Acute toxic encephalopathy -Similar presentation to previous admission, likely secondary to continued drug use -CT head unremarkable for acute intracranial etiology -Was evaluated by psychiatry 8/31  and 9/1, was determined incapable of making own medical decision. On 9/3, patient was re-evaluated by psych, IVC was rescinded  -Much improved today, calm and off restraints, verbal and speech clear   Aspiration pneumonia -On room air -Blood cultures negative to date  -Augmentin x 5 days. Completed treatment.   Rhabdomyolysis, myositis with back pain -CK, improved with supportive care   Elevated liver enzymes -Improved  History of drug use -Social worker consulted. Rehab resources provided.   Seizure disorder -Patient not on any antiepileptic  Tobacco abuse -Nicotine patch    Discharge Instructions  Discharge Instructions    Diet general    Complete by:  As directed    Discharge instructions    Complete by:  As directed    You were cared for by a hospitalist during your hospital stay. If you have any questions about your discharge medications or the care you received while you were in the hospital after you are discharged, you can call the unit and asked to speak with the hospitalist on call if the hospitalist that took care of you is not available. Once you are discharged, your primary care physician will handle any further medical issues. Please note that NO REFILLS for any discharge medications will be authorized once you are discharged, as it is imperative that you return to your primary care physician (or establish a relationship with a primary care physician if you do not have one) for your aftercare needs so that they can reassess your need for medications and monitor your lab values.   Driving Restrictions    Complete by:  As directed    DO NOT DRIVE UNDER INFLUENCE OF ALCOHOL OR ILLICIT SUBSTANCES.   Increase activity slowly    Complete by:  As directed  Allergies as of 08/21/2017   No Known Allergies     Medication List    STOP taking these medications   amoxicillin-clavulanate 875-125 MG tablet Commonly known as:  AUGMENTIN   potassium phosphate  (monobasic) 500 MG tablet Commonly known as:  K-PHOS ORIGINAL     TAKE these medications   gabapentin 300 MG capsule Commonly known as:  NEURONTIN Take 300 mg by mouth 3 (three) times daily.   magnesium oxide 400 (241.3 Mg) MG tablet Commonly known as:  MAG-OX Take 1 tablet (400 mg total) by mouth daily.   mirtazapine 15 MG tablet Commonly known as:  REMERON Take 1 tablet (15 mg total) by mouth at bedtime.   sertraline 50 MG tablet Commonly known as:  ZOLOFT Take 3 tablets (150 mg total) by mouth daily. What changed:  medication strength  how much to take            Discharge Care Instructions        Start     Ordered   08/21/17 0000  mirtazapine (REMERON) 15 MG tablet  Daily at bedtime     08/21/17 1523   08/21/17 0000  sertraline (ZOLOFT) 50 MG tablet  Daily     08/21/17 1523   08/21/17 0000  Increase activity slowly     08/21/17 1523   08/21/17 0000  Discharge instructions    Comments:  You were cared for by a hospitalist during your hospital stay. If you have any questions about your discharge medications or the care you received while you were in the hospital after you are discharged, you can call the unit and asked to speak with the hospitalist on call if the hospitalist that took care of you is not available. Once you are discharged, your primary care physician will handle any further medical issues. Please note that NO REFILLS for any discharge medications will be authorized once you are discharged, as it is imperative that you return to your primary care physician (or establish a relationship with a primary care physician if you do not have one) for your aftercare needs so that they can reassess your need for medications and monitor your lab values.   08/21/17 1523   08/21/17 0000  Diet general     08/21/17 1523   08/21/17 0000  Driving Restrictions    Comments:  DO NOT DRIVE UNDER INFLUENCE OF ALCOHOL OR ILLICIT SUBSTANCES.   08/21/17 1523     Follow-up  Information    Placey, Chales Abrahams, NP. Go to.   Why:  Please go to the [facility] to sign up for their services, including a PCP, medicaiton assistance, and homeless resouces. Contact information: [address] [place] [address] [phone]          No Known Allergies  Consultations:  Psychiatry    Procedures/Studies: Dg Ribs Unilateral W/chest Left  Result Date: 08/14/2017 CLINICAL DATA:  Lethargic, left-sided rib pain EXAM: LEFT RIBS AND CHEST - 3+ VIEW COMPARISON:  None. FINDINGS: AP semi-erect view of the chest demonstrates no acute infiltrate or effusion. Normal cardiomediastinal silhouette. No pneumothorax. Bilateral nipple rings. Left rib series demonstrates no acute displaced left rib fracture IMPRESSION: 1. Negative single-view chest 2. No acute displaced left rib fracture. Electronically Signed   By: Jasmine Pang M.D.   On: 08/14/2017 18:51   Ct Head Wo Contrast  Result Date: 08/18/2017 CLINICAL DATA:  Altered level of consciousness. History of drug abuse and seizures. EXAM: CT HEAD WITHOUT CONTRAST TECHNIQUE:  Contiguous axial images were obtained from the base of the skull through the vertex without intravenous contrast. COMPARISON:  CT HEAD August 14, 2017 FINDINGS: BRAIN: No intraparenchymal hemorrhage, mass effect nor midline shift. The ventricles and sulci are normal. No acute large vascular territory infarcts. No abnormal extra-axial fluid collections. Basal cisterns are patent. VASCULAR: Unremarkable. SKULL/SOFT TISSUES: No skull fracture. No significant soft tissue swelling. ORBITS/SINUSES: The included ocular globes and orbital contents are normal.Soft tissue opacifies and expands the RIGHT maxillary sinus with RIGHT frontal and ethmoid sinusitis. Mild LEFT maxillary sinus mucosal thickening. OTHER: None. IMPRESSION: 1. Normal noncontrast CT HEAD. 2. 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 Electronically Signed   By:  Awilda Metro M.D.   On: 08/18/2017 01:29   Ct Head Wo Contrast  Result Date: 08/14/2017 CLINICAL DATA:  Found lying the side of the road. Altered mental status. EXAM: CT HEAD WITHOUT CONTRAST CT CERVICAL SPINE WITHOUT CONTRAST TECHNIQUE: Multidetector CT imaging of the head and cervical spine was performed following the standard protocol without intravenous contrast. Multiplanar CT image reconstructions of the cervical spine were also generated. COMPARISON:  Head CT 01/14/2017 and 01/20/2017. FINDINGS: CT HEAD FINDINGS Brain: No mass lesion, intraparenchymal hemorrhage or extra-axial collection. No evidence of acute cortical infarct. Brain parenchyma and CSF-containing spaces are normal for age. Vascular: No hyperdense vessel or unexpected calcification. Skull: Normal visualized skull base, calvarium and extracranial soft tissues. Sinuses/Orbits: Partial opacification of the right frontal and anterior right ethmoid sinuses. Complete opacification of the right maxillary sinus. No mastoid or middle ear effusion. Normal orbits. CT CERVICAL SPINE FINDINGS Alignment: No static subluxation. Facets are aligned. Occipital condyles are normally positioned. Skull base and vertebrae: No acute fracture. Soft tissues and spinal canal: No prevertebral fluid or swelling. No visible canal hematoma. Disc levels: No advanced spinal canal or neural foraminal stenosis. Upper chest: No pneumothorax, pulmonary nodule or pleural effusion. Other: Normal visualized paraspinal cervical soft tissues. IMPRESSION: 1. No acute intracranial abnormality. 2. No acute fracture or static subluxation of the cervical spine. 3. Right ostiomeatal complex pattern sinusitis. Electronically Signed   By: Deatra Robinson M.D.   On: 08/14/2017 23:37   Ct Cervical Spine Wo Contrast  Result Date: 08/14/2017 CLINICAL DATA:  Found lying the side of the road. Altered mental status. EXAM: CT HEAD WITHOUT CONTRAST CT CERVICAL SPINE WITHOUT CONTRAST  TECHNIQUE: Multidetector CT imaging of the head and cervical spine was performed following the standard protocol without intravenous contrast. Multiplanar CT image reconstructions of the cervical spine were also generated. COMPARISON:  Head CT 01/14/2017 and 01/20/2017. FINDINGS: CT HEAD FINDINGS Brain: No mass lesion, intraparenchymal hemorrhage or extra-axial collection. No evidence of acute cortical infarct. Brain parenchyma and CSF-containing spaces are normal for age. Vascular: No hyperdense vessel or unexpected calcification. Skull: Normal visualized skull base, calvarium and extracranial soft tissues. Sinuses/Orbits: Partial opacification of the right frontal and anterior right ethmoid sinuses. Complete opacification of the right maxillary sinus. No mastoid or middle ear effusion. Normal orbits. CT CERVICAL SPINE FINDINGS Alignment: No static subluxation. Facets are aligned. Occipital condyles are normally positioned. Skull base and vertebrae: No acute fracture. Soft tissues and spinal canal: No prevertebral fluid or swelling. No visible canal hematoma. Disc levels: No advanced spinal canal or neural foraminal stenosis. Upper chest: No pneumothorax, pulmonary nodule or pleural effusion. Other: Normal visualized paraspinal cervical soft tissues. IMPRESSION: 1. No acute intracranial abnormality. 2. No acute fracture or static subluxation of the cervical spine. 3. Right  ostiomeatal complex pattern sinusitis. Electronically Signed   By: Deatra RobinsonKevin  Herman M.D.   On: 08/14/2017 23:37   Mr Lumbar Spine W Wo Contrast  Result Date: 08/15/2017 CLINICAL DATA:  26 y/o M; back pain with suspected cancer or infection. EXAM: MRI LUMBAR SPINE WITHOUT AND WITH CONTRAST TECHNIQUE: Multiplanar and multiecho pulse sequences of the lumbar spine were obtained without and with intravenous contrast. CONTRAST:  15mL MULTIHANCE GADOBENATE DIMEGLUMINE 529 MG/ML IV SOLN COMPARISON:  None. FINDINGS: Segmentation:  Standard. Alignment:   Physiologic. Vertebrae: No fracture, evidence of discitis, or bone lesion. No abnormal enhancement. Conus medullaris: Extends to the L1-2 level and appears normal. No abnormal enhancement of the conus medullaris or cauda equina. Paraspinal and other soft tissues: There is edema and enhancement throughout the left-sided paraspinal muscles and minimally within the left upper psoas muscle. No discrete fluid collection is identified. Disc levels: No significant disc displacement, foraminal narrowing, or canal stenosis. IMPRESSION: 1. Edema and enhancement throughout left-sided paraspinal muscles and minimally within the left upper psoas muscle may represent rhabdomyolysis or myositis. No abscess identified. 2. No abnormality of the lumbar spine identified. 3. No abnormal signal or enhancement of the cauda equina or conus medullaris. Electronically Signed   By: Mitzi HansenLance  Furusawa-Stratton M.D.   On: 08/15/2017 21:27   Dg Chest Port 1 View  Result Date: 08/15/2017 CLINICAL DATA:  Fever.  Altered mental status.  Drug overdose. EXAM: PORTABLE CHEST 1 VIEW COMPARISON:  08/14/2017. FINDINGS: Mild fullness of the upper mediastinum most likely related to AP portable technique and slight lordotic projection. Cardiomegaly. Scratched it mild right mid lung field subsegmental atelectasis. Mild infiltrate left lung base cannot be excluded. No pleural effusion or pneumothorax . IMPRESSION: Mild right mid lung field subsegmental atelectasis. Mild infiltrate left lung base cannot be excluded . Electronically Signed   By: Maisie Fushomas  Register   On: 08/15/2017 10:06       Discharge Exam: Vitals:   08/21/17 0534 08/21/17 1316  BP: 100/60 128/83  Pulse: 72 68  Resp: 18 18  Temp: 98.3 F (36.8 C)   SpO2: 99% 100%   Vitals:   08/20/17 1330 08/20/17 1950 08/21/17 0534 08/21/17 1316  BP: 119/66 132/65 100/60 128/83  Pulse: (!) 56 79 72 68  Resp: 18 18 18 18   Temp: 98.7 F (37.1 C) 98.6 F (37 C) 98.3 F (36.8 C)   TempSrc:  Oral Oral Oral   SpO2: 99% 99% 99% 100%  Weight:   75.4 kg (166 lb 3.6 oz)     General: Pt is alert, awake, not in acute distress Cardiovascular: RRR, S1/S2 +, no rubs, no gallops Respiratory: CTA bilaterally, no wheezing, no rhonchi Abdominal: Soft, NT, ND, bowel sounds + Extremities: no edema, no cyanosis    The results of significant diagnostics from this hospitalization (including imaging, microbiology, ancillary and laboratory) are listed below for reference.     Microbiology: Recent Results (from the past 240 hour(s))  CSF culture     Status: None   Collection Time: 08/15/17 12:04 AM  Result Value Ref Range Status   Specimen Description CSF  Final   Special Requests Normal  Final   Gram Stain   Final    NO ORGANISMS SEEN NO WBC SEEN CYTOSPIN Gram Stain Report Called to,Read Back By and Verified With: Larina EarthlyJOHN FRICKY,RN [phone] @ 0130 BY J SCOTTON    Culture   Final    NO GROWTH 3 DAYS Performed at [facility], [address]  [place], [place] [address]    Report Status 08/18/2017 FINAL  Final  Culture, blood (Routine X 2) w Reflex to ID Panel     Status: None   Collection Time: 08/15/17 12:20 AM  Result Value Ref Range Status   Specimen Description BLOOD RIGHT HAND  Final   Special Requests   Final    BOTTLES DRAWN AEROBIC AND ANAEROBIC Blood Culture adequate volume   Culture   Final    NO GROWTH 5 DAYS Performed at [facility], [address] [place], [place] [address]    Report Status 08/20/2017 FINAL  Final  Culture, blood (Routine X 2) w Reflex to ID Panel     Status: None   Collection Time: 08/15/17 12:32 AM  Result Value Ref Range Status   Specimen Description BLOOD LEFT HAND  Final   Special Requests   Final    BOTTLES DRAWN AEROBIC AND ANAEROBIC Blood Culture adequate volume   Culture   Final    NO GROWTH 5 DAYS Performed at [facility], [address] [place], [place] [address]    Report Status 08/20/2017 FINAL  Final  MRSA PCR  Screening     Status: None   Collection Time: 08/15/17 12:50 PM  Result Value Ref Range Status   MRSA by PCR NEGATIVE NEGATIVE Final    Comment:        The GeneXpert MRSA Assay (FDA approved for NASAL specimens only), is one component of a comprehensive MRSA colonization surveillance program. It is not intended to diagnose MRSA infection nor to guide or monitor treatment for MRSA infections.   Culture, blood (single) w Reflex to ID Panel     Status: None   Collection Time: 08/15/17  4:39 PM  Result Value Ref Range Status   Specimen Description BLOOD LEFT HAND  Final   Special Requests   Final    BOTTLES DRAWN AEROBIC ONLY Blood Culture adequate volume   Culture   Final    NO GROWTH 5 DAYS Performed at [facility], [address], [place], [place] [address]    Report Status 08/20/2017 FINAL  Final  Culture, blood (single) w Reflex to ID Panel     Status: None   Collection Time: 08/16/17  3:04 AM  Result Value Ref Range Status   Specimen Description BLOOD RIGHT ANTECUBITAL  Final   Special Requests   Final    BOTTLES DRAWN AEROBIC ONLY Blood Culture adequate volume   Culture   Final    NO GROWTH 5 DAYS Performed at [facility], [address], [place], [place] [address]    Report Status 08/21/2017 FINAL  Final  Culture, blood (single) w Reflex to ID Panel     Status: None   Collection Time: 08/16/17  3:04 AM  Result Value Ref Range Status   Specimen Description BLOOD LEFT HAND  Final   Special Requests IN PEDIATRIC BOTTLE Blood Culture adequate volume  Final   Culture   Final    NO GROWTH 5 DAYS Performed at [facility], [address], [place], [place] [address]    Report Status 08/21/2017 FINAL  Final  Culture, blood (routine x 2)     Status: None (Preliminary result)   Collection Time: 08/18/17 12:03 AM  Result Value Ref Range Status   Specimen Description BLOOD LEFT ARM  Final   Special Requests   Final    BOTTLES DRAWN AEROBIC AND ANAEROBIC  Blood  Culture adequate volume   Culture   Final    NO GROWTH 3 DAYS Performed at [facility], [address], [place], [place] [address]    Report Status PENDING  Incomplete  Culture, blood (routine x 2)     Status: None (Preliminary result)   Collection Time: 08/18/17 12:08 AM  Result Value Ref Range Status   Specimen Description BLOOD LEFT ANTECUBITAL  Final   Special Requests   Final    BOTTLES DRAWN AEROBIC AND ANAEROBIC Blood Culture adequate volume   Culture   Final    NO GROWTH 3 DAYS Performed at [facility], [address], [place], [place] [address]    Report Status PENDING  Incomplete     Labs: BNP (last 3 results) No results for input(s): BNP in the last 8760 hours. Basic Metabolic Panel:  Recent Labs Lab 08/15/17 8295 08/16/17 0304 08/17/17 0514 08/17/17 1926 08/19/17 0640 08/20/17 0738 08/21/17 0529  NA 138 140 138 139 140 139 139  K 3.7 3.3* 3.5 3.8 3.5 3.5 3.7  CL 110 109 110 108 106 105 106  CO2 21* 25 22 24 27 25 25   GLUCOSE 118* 123* 105* 86 101* 88 88  BUN 21* 11 10 9  <5* 13 11  CREATININE 0.93 0.81 0.80 0.88 0.83 1.09 1.04  CALCIUM 7.6* 8.1* 8.3* 8.6* 8.7* 9.1 8.9  MG 1.5* 1.9 1.6*  --   --   --   --   PHOS 1.8*  --  1.6*  --   --   --   --    Liver Function Tests:  Recent Labs Lab 08/17/17 0514 08/17/17 1926 08/19/17 0640 08/20/17 0738 08/21/17 0529  AST 265* 203* 76* 44* 44*  ALT 87* 94* 69* 57 50  ALKPHOS 40 47 41 42 44  BILITOT 0.8 0.5 0.6 0.4 0.5  PROT 5.9* 6.5 6.4* 7.0 6.9  ALBUMIN 2.8* 3.1* 2.9* 3.1* 3.2*   No results for input(s): LIPASE, AMYLASE in the last 168 hours.  Recent Labs Lab 08/14/17 2318 08/18/17 0001  AMMONIA 14 25   CBC:  Recent Labs Lab 08/14/17 1754 08/15/17 6213 08/16/17 0304 08/17/17 0514 08/17/17 1926 08/19/17 0640  WBC 9.2 7.2 9.2 7.4 7.6 7.6  NEUTROABS 7.7  --   --   --  5.2  --   HGB 12.4* 10.4* 10.9* 10.7* 11.1* 11.5*  HCT 37.9* 31.7* 32.5* 31.4* 32.4* 33.2*  MCV 91.5  90.3 88.6 87.7 85.7 86.5  PLT 258 184 189 209 251 333   Cardiac Enzymes:  Recent Labs Lab 08/16/17 0304 08/17/17 0514 08/17/17 1926 08/19/17 0640 08/21/17 0529  CKTOTAL 16,150* 0,865* 5,112* 1,819* 562*   BNP: Invalid input(s): POCBNP CBG: No results for input(s): GLUCAP in the last 168 hours. D-Dimer No results for input(s): DDIMER in the last 72 hours. Hgb A1c No results for input(s): HGBA1C in the last 72 hours. Lipid Profile No results for input(s): CHOL, HDL, LDLCALC, TRIG, CHOLHDL, LDLDIRECT in the last 72 hours. Thyroid function studies No results for input(s): TSH, T4TOTAL, T3FREE, THYROIDAB in the last 72 hours.  Invalid input(s): FREET3 Anemia work up No results for input(s): VITAMINB12, FOLATE, FERRITIN, TIBC, IRON, RETICCTPCT in the last 72 hours. Urinalysis    Component Value Date/Time   COLORURINE YELLOW 08/14/2017 1924   APPEARANCEUR CLOUDY (A) 08/14/2017 1924   LABSPEC 1.020 08/14/2017 1924   PHURINE 8.5 (H) 08/14/2017 1924   GLUCOSEU NEGATIVE 08/14/2017 1924   HGBUR LARGE (A)  08/14/2017 1924   BILIRUBINUR SMALL (A) 08/14/2017 1924   KETONESUR 20 (A) 08/14/2017 1924   PROTEINUR 100 (A) 08/14/2017 1924   NITRITE NEGATIVE 08/14/2017 1924   LEUKOCYTESUR NEGATIVE 08/14/2017 1924   Sepsis Labs Invalid input(s): PROCALCITONIN,  WBC,  LACTICIDVEN Microbiology Recent Results (from the past 240 hour(s))  CSF culture     Status: None   Collection Time: 08/15/17 12:04 AM  Result Value Ref Range Status   Specimen Description CSF  Final   Special Requests Normal  Final   Gram Stain   Final    NO ORGANISMS SEEN NO WBC SEEN CYTOSPIN Gram Stain Report Called to,Read Back By and Verified With: Larina Earthly [phone] @ 0130 BY J SCOTTON    Culture   Final    NO GROWTH 3 DAYS Performed at [facility], [address], [place], [place] [address]    Report Status 08/18/2017 FINAL  Final  Culture, blood (Routine X 2) w Reflex to ID Panel     Status:  None   Collection Time: 08/15/17 12:20 AM  Result Value Ref Range Status   Specimen Description BLOOD RIGHT HAND  Final   Special Requests   Final    BOTTLES DRAWN AEROBIC AND ANAEROBIC Blood Culture adequate volume   Culture   Final    NO GROWTH 5 DAYS Performed at [facility], [address] [place], [place] [address]    Report Status 08/20/2017 FINAL  Final  Culture, blood (Routine X 2) w Reflex to ID Panel     Status: None   Collection Time: 08/15/17 12:32 AM  Result Value Ref Range Status   Specimen Description BLOOD LEFT HAND  Final   Special Requests   Final    BOTTLES DRAWN AEROBIC AND ANAEROBIC Blood Culture adequate volume   Culture   Final    NO GROWTH 5 DAYS Performed at [facility], [address] [place], [place] [address]    Report Status 08/20/2017 FINAL  Final  MRSA PCR Screening     Status: None   Collection Time: 08/15/17 12:50 PM  Result Value Ref Range Status   MRSA by PCR NEGATIVE NEGATIVE Final    Comment:        The GeneXpert MRSA Assay (FDA approved for NASAL specimens only), is one component of a comprehensive MRSA colonization surveillance program. It is not intended to diagnose MRSA infection nor to guide or monitor treatment for MRSA infections.   Culture, blood (single) w Reflex to ID Panel     Status: None   Collection Time: 08/15/17  4:39 PM  Result Value Ref Range Status   Specimen Description BLOOD LEFT HAND  Final   Special Requests   Final    BOTTLES DRAWN AEROBIC ONLY Blood Culture adequate volume   Culture   Final    NO GROWTH 5 DAYS Performed at [facility], [address], [place], [place] [address]    Report Status 08/20/2017 FINAL  Final  Culture, blood (single) w Reflex to ID Panel     Status: None   Collection Time: 08/16/17  3:04 AM  Result Value Ref Range Status   Specimen Description BLOOD RIGHT ANTECUBITAL  Final   Special Requests   Final    BOTTLES DRAWN AEROBIC ONLY Blood Culture adequate  volume   Culture   Final    NO GROWTH 5 DAYS Performed at [facility], [address] [place],  [place] [address]    Report Status 08/21/2017 FINAL  Final  Culture, blood (single) w Reflex to ID Panel     Status: None   Collection Time: 08/16/17  3:04 AM  Result Value Ref Range Status   Specimen Description BLOOD LEFT HAND  Final   Special Requests IN PEDIATRIC BOTTLE Blood Culture adequate volume  Final   Culture   Final    NO GROWTH 5 DAYS Performed at [facility], [address], [place], [place] [address]    Report Status 08/21/2017 FINAL  Final  Culture, blood (routine x 2)     Status: None (Preliminary result)   Collection Time: 08/18/17 12:03 AM  Result Value Ref Range Status   Specimen Description BLOOD LEFT ARM  Final   Special Requests   Final    BOTTLES DRAWN AEROBIC AND ANAEROBIC Blood Culture adequate volume   Culture   Final    NO GROWTH 3 DAYS Performed at [facility], [address], [place], [place] [address]    Report Status PENDING  Incomplete  Culture, blood (routine x 2)     Status: None (Preliminary result)   Collection Time: 08/18/17 12:08 AM  Result Value Ref Range Status   Specimen Description BLOOD LEFT ANTECUBITAL  Final   Special Requests   Final    BOTTLES DRAWN AEROBIC AND ANAEROBIC Blood Culture adequate volume   Culture   Final    NO GROWTH 3 DAYS Performed at [facility], [address], [place], [place] [address]    Report Status PENDING  Incomplete     Time coordinating discharge: 20 minutes  SIGNED:  Noralee Stain, DO [facility] Pager [phone]  If 7PM-7AM, please contact night-coverage [email or web address] Password [email or web address] 08/21/2017, 3:26 PM

## 2017-08-21 NOTE — Care Management Note (Signed)
Case Management Note  Patient Details  Name: Mark Strickland MRN: [id number] Date of Birth: 08-16-1991  Subjective/Objective:                  overdose  Action/Plan: Date:  August 21, 2017 Chart reviewed for concurrent status and case management needs. Will continue to follow patient progress. Discharge Planning: following for needs Expected discharge date: [id number] Marcelle SmilingRhonda Davis, BSN, [place]3, [place]   [phone]  Expected Discharge Date:   (unknown)               Expected Discharge Plan:  Home/Self Care  In-House Referral:  Clinical Social Work  Discharge planning Services  CM Consult  Post Acute Care Choice:    Choice offered to:     DME Arranged:    DME Agency:     HH Arranged:    HH Agency:     Status of Service:  In process, will continue to follow  If discussed at Long Length of Stay Meetings, dates discussed:    Additional Comments:  Golda AcreDavis, Rhonda Lynn, RN 08/21/2017, 9:15 AM

## 2017-08-21 NOTE — Progress Notes (Signed)
IV removed, patient understands discharge instructions. Patient leaving with brother. All belongings taken with patient.

## 2017-08-21 NOTE — Consult Note (Addendum)
[facility] Consult   Reason for Consult: re-evalulation Referring Physician:  Cristy Friedlander Patient Identification: Ethelbert Thain MRN:  [id number] Principal Diagnosis: Altered mental status Diagnosis:   Patient Active Problem List   Diagnosis Date Noted  . Encephalopathy [G93.40] 08/18/2017  . Altered mental status [R41.82]   . Polysubstance overdose [T50.901A] 08/15/2017  . Rhabdomyolysis [M62.82] 08/15/2017  . Elevated LFTs [R79.89] 08/14/2017  . Fever [R50.9] 08/14/2017  . Drug overdose, intentional ([place]) [T50.902A] 12/03/2015  . Encephalopathy, toxic [G92] 12/03/2015    Total Time spent with patient: 30 minutes  Subjective:   Suleman Gunning is a 26 y.o. male patient admitted with overdose .  HPI:  Please also review  Consultation by Dr. Louretta Shorten, from 9/1. Patient is a 26 year old single male, reports he lives with a brother. Was admitted for Opiate/Benzodiazepine Abuse, Intoxication, and attendant altered mental status and rhabdomyolysis. Patient is currently fully alert, attentive, oriented x 3. He denies any symptoms of withdrawal and does not appear internally preoccupied . He states he has a history of Opiate Dependence. States he has had long  periods of sobriety , with period of being sober for more than a year in the past. States that after a period of sobriety he recently relapsed. Attributes his overdose to having lost tolerance. States he used to spend up to $ 200 a day on Opiates , and on day of overdose spent " only $20".  Patient denies that recent overdose was suicidal in intent, and states it was accidental. At this time denies significant depression and does not endorse neuro-vegetative symptoms of depression. Denies any SI, and is future oriented .States he realizes he came close to dying and is now describing being motivated in sobriety. He states he is planning on going to a [employer] in [place] as of next week. In the meantime intends  to return home and go back to work. States he plans to go to [employer] regularly, and expresses understanding of avoiding people, places and things associated with drug abuse to minimize risk of WDL. He is on Neurontin , Zoloft, Remeron, denies medication side effects.  Past Psychiatric History: history of substance abuse , history of opiate overdose   Risk to Self: Is patient at risk for suicide?: No Risk to Others:   Prior Inpatient Therapy:   Prior Outpatient Therapy:    Past Medical History:  Past Medical History:  Diagnosis Date  . Anxiety   . Depressed   . Opiate addiction ([place])   . Seizures ([place])    childhood   History reviewed. No pertinent surgical history. Family History:  Family History  Problem Relation Age of Onset  . Family history unknown: Yes   Family Psychiatric  History:  Social History:  History  Alcohol Use  . Yes     History  Drug Use  . Types: IV    Comment: Heroin     Social History   Social History  . Marital status: Single    Spouse name: N/A  . Number of children: N/A  . Years of education: N/A   Social History Main Topics  . Smoking status: Current Every Day Smoker    Packs/day: 1.00    Types: Cigarettes  . Smokeless tobacco: Never Used  . Alcohol use Yes  . Drug use: Yes    Types: IV     Comment: Heroin   . Sexual activity: Not Asked   Other Topics Concern  . None  Social History Narrative  . None   Additional Social History:    Allergies:  No Known Allergies  Labs:  Results for orders placed or performed during the hospital encounter of 08/17/17 (from the past 48 hour(s))  Comprehensive metabolic panel     Status: Abnormal   Collection Time: 08/20/17  7:38 AM  Result Value Ref Range   Sodium 139 135 - 145 mmol/L   Potassium 3.5 3.5 - 5.1 mmol/L   Chloride 105 101 - 111 mmol/L   CO2 25 22 - 32 mmol/L   Glucose, Bld 88 65 - 99 mg/dL   BUN 13 6 - 20 mg/dL   Creatinine, Ser 1.09 0.61 - 1.24 mg/dL   Calcium 9.1  8.9 - 10.3 mg/dL   Total Protein 7.0 6.5 - 8.1 g/dL   Albumin 3.1 (L) 3.5 - 5.0 g/dL   AST 44 (H) 15 - 41 U/L   ALT 57 17 - 63 U/L   Alkaline Phosphatase 42 38 - 126 U/L   Total Bilirubin 0.4 0.3 - 1.2 mg/dL   GFR calc non Af Amer >60 >60 mL/min   GFR calc Af Amer >60 >60 mL/min    Comment: (NOTE) The eGFR has been calculated using the CKD EPI equation. This calculation has not been validated in all clinical situations. eGFR's persistently <60 mL/min signify possible Chronic Kidney Disease.    Anion gap 9 5 - 15  Comprehensive metabolic panel     Status: Abnormal   Collection Time: 08/21/17  5:29 AM  Result Value Ref Range   Sodium 139 135 - 145 mmol/L   Potassium 3.7 3.5 - 5.1 mmol/L   Chloride 106 101 - 111 mmol/L   CO2 25 22 - 32 mmol/L   Glucose, Bld 88 65 - 99 mg/dL   BUN 11 6 - 20 mg/dL   Creatinine, Ser 1.04 0.61 - 1.24 mg/dL   Calcium 8.9 8.9 - 10.3 mg/dL   Total Protein 6.9 6.5 - 8.1 g/dL   Albumin 3.2 (L) 3.5 - 5.0 g/dL   AST 44 (H) 15 - 41 U/L   ALT 50 17 - 63 U/L   Alkaline Phosphatase 44 38 - 126 U/L   Total Bilirubin 0.5 0.3 - 1.2 mg/dL   GFR calc non Af Amer >60 >60 mL/min   GFR calc Af Amer >60 >60 mL/min    Comment: (NOTE) The eGFR has been calculated using the CKD EPI equation. This calculation has not been validated in all clinical situations. eGFR's persistently <60 mL/min signify possible Chronic Kidney Disease.    Anion gap 8 5 - 15  CK     Status: Abnormal   Collection Time: 08/21/17  5:29 AM  Result Value Ref Range   Total CK 562 (H) 49 - 397 U/L    Current Facility-Administered Medications  Medication Dose Route Frequency Provider Last Rate Last Dose  . acetaminophen (TYLENOL) tablet 650 mg  650 mg Oral Q6H PRN Jani Gravel, MD   650 mg at 08/21/17 4580   Or  . acetaminophen (TYLENOL) suppository 650 mg  650 mg Rectal Q6H PRN Jani Gravel, MD      . amoxicillin-clavulanate (AUGMENTIN) 875-125 MG per tablet 1 tablet  1 tablet Oral Q12H Dessa Phi Chahn-Yang, DO   1 tablet at 08/21/17 [phone]  . diazepam (VALIUM) injection 5 mg  5 mg Intravenous Q8H PRN Dessa Phi Chahn-Yang, DO   5 mg at 08/21/17 3825  . enoxaparin (LOVENOX) injection 40 mg  40 mg Subcutaneous QHS Jani Gravel, MD   40 mg at 08/20/17 2222  . folic acid (FOLVITE) tablet 1 mg  1 mg Oral Daily Berton Mount, RPH   1 mg at 08/21/17 2979  . gabapentin (NEURONTIN) capsule 300 mg  300 mg Oral TID Dessa Phi Chahn-Yang, DO   300 mg at 08/21/17 8921  . haloperidol lactate (HALDOL) injection 5 mg  5 mg Intravenous Q6H PRN Dessa Phi Chahn-Yang, DO   5 mg at 08/20/17 1439  . ketorolac (TORADOL) 30 MG/ML injection 30 mg  30 mg Intravenous Q6H PRN Dessa Phi Chahn-Yang, DO   30 mg at 08/19/17 1526  . LORazepam (ATIVAN) injection 2-3 mg  2-3 mg Intravenous Q1H PRN Jani Gravel, MD   3 mg at 08/21/17 1124  . mirtazapine (REMERON) tablet 15 mg  15 mg Oral QHS Ambrose Finland, MD   15 mg at 08/20/17 2223  . nicotine (NICODERM CQ - dosed in mg/24 hours) patch 21 mg  21 mg Transdermal Daily Lajean Saver, MD   21 mg at 08/21/17 0959  . sertraline (ZOLOFT) tablet 150 mg  150 mg Oral Daily Ambrose Finland, MD   150 mg at 08/21/17 0958  . thiamine (VITAMIN B-1) tablet 100 mg  100 mg Oral Daily Berton Mount, RPH   100 mg at 08/21/17 1941  . traMADol (ULTRAM) tablet 50 mg  50 mg Oral Q6H PRN Dessa Phi Chahn-Yang, DO   50 mg at 08/19/17 1526  . zolpidem (AMBIEN) tablet 5 mg  5 mg Oral QHS PRN Dessa Phi Chahn-Yang, DO   5 mg at 08/20/17 2223    Musculoskeletal: Strength & Muscle Tone: within normal limits Gait & Station: normal Patient leans: N/A  Psychiatric Specialty Exam: Physical Exam  ROS no chest pain, no shortness of breath, no vomiting, no diarrhea, denies cramping or aches, no fever, no chills   Blood pressure 128/83, pulse 68, temperature 98.3 F (36.8 C), temperature source Oral, resp. rate 18, weight 75.4 kg (166 lb 3.6 oz),  SpO2 100 %.Body mass index is 23.18 kg/m.  General Appearance: Well Groomed  Eye Contact:  Good  Speech:  Normal Rate  Volume:  Normal  Mood:  denies feeling depressed, describes mood as "OK"  Affect:  appropriate, reactive   Thought Process:  Linear and Descriptions of Associations: Intact  Orientation:  Full (Time, Place, and Person)  Thought Content:  no hallucinations, no delusions, not internally preoccupied   Suicidal Thoughts:  No denies any suicidal or self injurious ideations, denies homicidal ideations  Homicidal Thoughts:  No  Memory:  recent and remote grossly intact   Judgement:  Fair- improving   Insight:  Fair- improving  Psychomotor Activity:  Normal  Concentration:  Concentration: Good and Attention Span: Good  Recall:  Good  Fund of Knowledge:  Good  Language:  Good  Akathisia:  Negative  Handed:  Right  AIMS (if indicated):     Assets:  Communication Skills Desire for Improvement Resilience  ADL's:  Intact  Cognition:  WNL  Sleep:      Assessment - patient is a 26 year old male , who has history of opiate dependence. He was admitted following opiate overdose , which he states was accidental, not suicidal in intent. Of note, he had a prior medical admission and was readmitted soon after initial discharge due to relapse and intoxication.  At this time patient presents alert, attentive, 0x3, in no acute distress, is denying depression, denying any  SI or HI, no psychosis, and presents  future oriented, expressing plan to go to NA meetings regularly and to an [employer] in [place] . Of note, he states he was on Suboxone management in the past, but is not expressing interest in this maintenance treatment at this time. He currently expresses understanding of his condition ( Substance/Opiate Dependence, relapse leading to intoxication and  overdose) and acknowledges potential consequences of intoxication, stating he realizes he could have died . He is expressing  motivation in sobriety and  interest in treatment for his condition,as above. As per above , at this time patient has capacity to make decisions regarding disposition planning options.    Treatment Plan Summary: as below  Disposition: No evidence of imminent risk to self or others at present.   Patient does not meet criteria for psychiatric inpatient admission. There are no grounds for involuntary commitment based on current presentation. Due to severity of patient's substance dependence , I have recommended he consider referral to a residential rehabilitation setting directly, at discharge . Please continue to discuss and encourage this disposition plan with patient and include it in disposition planning .  If he prefers to go to [facility] in [place] next week, as is his stated plan, would with his consent coordinate with his family for close support and monitoring as he transitions to [facility]. Agree with Zoloft, Remeron .  Jenne Campus, MD 08/21/2017 1:36 PM

## 2017-08-21 NOTE — Progress Notes (Signed)
Disposition: Per psychiatrist evaluations, There are no grounds for Involuntary Commitment on presentation. Patient has capacity to make decisions regarding disposition planning options.  Patient medically improving.   CSW discussed residential options with patient, [facility] is closed today for Holiday and cannot make formal appointment.CSW discussed [facility] w/ patient he report he is not interested in the facility and has been there several times in the past and felt he was  "in a prison."  CSW put information on AVS for reference, " free facility for 30 treatment program." [facility]  because he left A[facility] several times.(CSW called to verify no answer in nurse admitting )  Patient reports he has been talking with his NA sponsor that plans to assist with [facility] [place].   Patient reports he plans to continue his IDOP at [facility].   3:26pm IVC rescind form, completed by physician. CSW faxed to Magistrate [phone]  Vivi BarrackNicole Lamoine Fredricksen, Theresia MajorsLCSWA, MSW Clinical Social Worker 5E and Psychiatric Service Line [phone] 08/21/2017  3:07 PM

## 2017-08-22 ENCOUNTER — Encounter: Payer: Self-pay | Admitting: Internal Medicine

## 2017-08-23 LAB — CULTURE, BLOOD (ROUTINE X 2)
Culture: NO GROWTH
Culture: NO GROWTH
Special Requests: ADEQUATE
Special Requests: ADEQUATE

## 2017-11-21 ENCOUNTER — Encounter (HOSPITAL_COMMUNITY): Payer: Self-pay | Admitting: Emergency Medicine

## 2017-11-21 ENCOUNTER — Emergency Department (HOSPITAL_COMMUNITY)
Admission: EM | Admit: 2017-11-21 | Discharge: 2017-11-21 | Disposition: A | Discharge status: Home / Self Care | Payer: Self-pay | Attending: Emergency Medicine | Admitting: Emergency Medicine

## 2017-11-21 DIAGNOSIS — F17210 Nicotine dependence, cigarettes, uncomplicated: Secondary | ICD-10-CM | POA: Insufficient documentation

## 2017-11-21 DIAGNOSIS — R569 Unspecified convulsions: Secondary | ICD-10-CM | POA: Insufficient documentation

## 2017-11-21 DIAGNOSIS — Z79899 Other long term (current) drug therapy: Secondary | ICD-10-CM | POA: Insufficient documentation

## 2017-11-21 LAB — URINALYSIS, ROUTINE W REFLEX MICROSCOPIC
Bilirubin Urine: NEGATIVE
GLUCOSE, UA: NEGATIVE mg/dL
HGB URINE DIPSTICK: NEGATIVE
KETONES UR: NEGATIVE mg/dL
Leukocytes, UA: NEGATIVE
Nitrite: NEGATIVE
PH: 5 (ref 5.0–8.0)
PROTEIN: NEGATIVE mg/dL
Specific Gravity, Urine: 1.026 (ref 1.005–1.030)

## 2017-11-21 LAB — CBC WITH DIFFERENTIAL/PLATELET
Basophils Absolute: 0 10*3/uL (ref 0.0–0.1)
Basophils Relative: 0 %
EOS PCT: 2 %
Eosinophils Absolute: 0.1 10*3/uL (ref 0.0–0.7)
HEMATOCRIT: 43.2 % (ref 39.0–52.0)
Hemoglobin: 14.5 g/dL (ref 13.0–17.0)
LYMPHS ABS: 1.6 10*3/uL (ref 0.7–4.0)
LYMPHS PCT: 27 %
MCH: 29.7 pg (ref 26.0–34.0)
MCHC: 33.6 g/dL (ref 30.0–36.0)
MCV: 88.3 fL (ref 78.0–100.0)
MONO ABS: 0.7 10*3/uL (ref 0.1–1.0)
Monocytes Relative: 12 %
NEUTROS ABS: 3.5 10*3/uL (ref 1.7–7.7)
Neutrophils Relative %: 59 %
PLATELETS: 281 10*3/uL (ref 150–400)
RBC: 4.89 MIL/uL (ref 4.22–5.81)
RDW: 12.9 % (ref 11.5–15.5)
WBC: 5.9 10*3/uL (ref 4.0–10.5)

## 2017-11-21 LAB — BASIC METABOLIC PANEL
ANION GAP: 8 (ref 5–15)
BUN: 14 mg/dL (ref 6–20)
CHLORIDE: 102 mmol/L (ref 101–111)
CO2: 25 mmol/L (ref 22–32)
Calcium: 8.7 mg/dL — ABNORMAL LOW (ref 8.9–10.3)
Creatinine, Ser: 1.11 mg/dL (ref 0.61–1.24)
GFR calc Af Amer: >60 mL/min (ref >60)
GFR calc non Af Amer: >60 mL/min (ref >60)
GLUCOSE: 75 mg/dL (ref 65–99)
POTASSIUM: 3.7 mmol/L (ref 3.5–5.1)
Sodium: 135 mmol/L (ref 135–145)

## 2017-11-21 LAB — ETHANOL

## 2017-11-21 LAB — RAPID URINE DRUG SCREEN, HOSP PERFORMED
AMPHETAMINES: NOT DETECTED
BARBITURATES: NOT DETECTED
BENZODIAZEPINES: NOT DETECTED
Cocaine: NOT DETECTED
Opiates: NOT DETECTED
Tetrahydrocannabinol: NOT DETECTED

## 2017-11-21 MED ORDER — KETOROLAC TROMETHAMINE 30 MG/ML IJ SOLN
30.0000 mg | Freq: Once | INTRAMUSCULAR | Status: AC
  Administered 2017-11-21: 30 mg via INTRAVENOUS
  Filled 2017-11-21: qty 1

## 2017-11-21 MED ORDER — SODIUM CHLORIDE 0.9 % IV SOLN
INTRAVENOUS | Status: DC
  Administered 2017-11-21: 15:00:00 via INTRAVENOUS

## 2017-11-21 NOTE — ED Provider Notes (Signed)
[facility] Provider Note   CSN: [id number] Arrival date & time: 11/21/17  1326     History   Chief Complaint Chief Complaint  Patient presents with  . Seizures    HPI Mark Strickland is a 26 y.o. male.  The patient presents for evaluation of seizure.  He reports that he was sitting in court when he had a seizure.  He denies prodrome.  He was transferred by EMS.  The patient feels like he hit the right side of his head, when he had the seizure.  It is unknown if he had a postictal state.  Patient reports ongoing stress, but nothing unusual today even though he was in court.  He states that the court issue has been going on "for 1 year."  He last had a seizure in March 2018 but did not seek medical care at that time.  He previously had seizures, but does not take any antiepileptics.  Patient denies recent fever, chills, cough, shortness of breath, focal weakness or paresthesia.  There are no other known modifying factors.  HPI  Past Medical History:  Diagnosis Date  . Anxiety   . Depressed   . Opiate addiction ([facility])   . Seizures ([facility])    childhood    Patient Active Problem List   Diagnosis Date Noted  . Encephalopathy 08/18/2017  . Altered mental status   . Polysubstance overdose 08/15/2017  . Rhabdomyolysis 08/15/2017  . Elevated LFTs 08/14/2017  . Fever 08/14/2017  . Drug overdose, intentional ([facility]) 12/03/2015  . Encephalopathy, toxic 12/03/2015    History reviewed. No pertinent surgical history.     Home Medications    Prior to Admission medications   Medication Sig Start Date End Date Taking? Authorizing Provider  gabapentin (NEURONTIN) 300 MG capsule Take 300 mg by mouth 3 (three) times daily.    [provider]  magnesium oxide (MAG-OX) 400 (241.3 Mg) MG tablet Take 1 tablet (400 mg total) by mouth daily. 08/17/17   Dorothea OgleMyers, Iskra M, MD  mirtazapine (REMERON) 15 MG tablet Take 1 tablet (15 mg total) by mouth at  bedtime. 08/21/17   Noralee Stainhoi, Jennifer, DO  sertraline (ZOLOFT) 50 MG tablet Take 3 tablets (150 mg total) by mouth daily. 08/21/17   Noralee Stainhoi, Jennifer, DO    Family History Family History  Family history unknown: Yes    Social History Social History   Tobacco Use  . Smoking status: Current Every Day Smoker    Packs/day: 1.00    Types: Cigarettes  . Smokeless tobacco: Never Used  Substance Use Topics  . Alcohol use: Yes  . Drug use: Yes    Types: IV    Comment: Heroin      Allergies   Patient has no known allergies.   Review of Systems Review of Systems  All other systems reviewed and are negative.    Physical Exam Updated Vital Signs BP 128/87 (BP Location: Right Arm)   Pulse 96   Temp 98.6 F (37 C) (Oral)   Resp 16   SpO2 98%   Physical Exam  Constitutional: He is oriented to person, place, and time. He appears well-developed and well-nourished. No distress.  HENT:  Head: Normocephalic.  Right Ear: External ear normal.  Left Ear: External ear normal.  Tender with mild swelling, right parietal occipital region.  No associated scalp defect.  No crepitation.  No tongue trauma.  Eyes: Conjunctivae and EOM are normal. Pupils are equal, round, and reactive  to light.  Neck: Normal range of motion and phonation normal. Neck supple.  Cardiovascular: Normal rate, regular rhythm and normal heart sounds.  Pulmonary/Chest: Effort normal and breath sounds normal. No respiratory distress. He exhibits no tenderness and no bony tenderness.  Abdominal: Soft. There is no tenderness.  Musculoskeletal: Normal range of motion.  Neurological: He is alert and oriented to person, place, and time. No cranial nerve deficit or sensory deficit. He exhibits normal muscle tone. Coordination normal.  No dysarthria, aphasia or nystagmus.  Skin: Skin is warm, dry and intact.  Psychiatric: He has a normal mood and affect. His behavior is normal. Judgment and thought content normal.  Nursing note  and vitals reviewed.    ED Treatments / Results  Labs (all labs ordered are listed, but only abnormal results are displayed) Labs Reviewed  BASIC METABOLIC PANEL  CBC WITH DIFFERENTIAL/PLATELET  URINALYSIS, ROUTINE W REFLEX MICROSCOPIC  RAPID URINE DRUG SCREEN, HOSP PERFORMED  ETHANOL    EKG  EKG Interpretation None       Radiology No results found.  Procedures Procedures (including critical care time)  Medications Ordered in ED Medications  ketorolac (TORADOL) 30 MG/ML injection 30 mg (not administered)  0.9 %  sodium chloride infusion (not administered)     Initial Impression / Assessment and Plan / ED Course  I have reviewed the triage vital signs and the nursing notes.  Pertinent labs & imaging results that were available during my care of the patient were reviewed by me and considered in my medical decision making (see chart for details).  Clinical Course as of Nov 21 1341  Tue Nov 21, 2017  1342 Patient was informed that he needed a CAT scan of the head, but states that "I do not want one because I do not think I hit my head that hard."  [EW]    Clinical Course User Index [EW] Mancel BaleWentz, Elba Dendinger, MD     Patient Vitals for the past 24 hrs:  BP Temp Temp src Pulse Resp SpO2  11/21/17 1331 128/87 98.6 F (37 C) Oral 96 16 98 %  11/21/17 1325 - - - - - 97 %    3:50 PM Reevaluation with update and discussion. After initial assessment and treatment, an updated evaluation reveals patient remained stable and comfortable.  He has no further complaints.  There have been no additional seizures in the emergency department.  Findings discussed with the patient, and all questions answered.Mancel Bale. Hobson Lax      Final Clinical Impressions(s) / ED Diagnoses   Final diagnoses:  None    Seizure, recurrent, unclear etiology.  No recent seizure, or evaluation.  I suspect that it is related to stress, or possibly insomnia.  This is essentially a new onset seizure, and  can be evaluated as an outpatient.  Nursing Notes Reviewed/ Care Coordinated Applicable Imaging Reviewed Interpretation of Laboratory Data incorporated into ED treatment  The patient appears reasonably screened and/or stabilized for discharge and I doubt any other medical condition or other [facility] requiring further screening, evaluation, or treatment in the ED at this time prior to discharge.  Plan: Home Medications-OTC analgesia as needed; Home Treatments-rest, fluids; return here if the recommended treatment, does not improve the symptoms; Recommended follow up-PCP, as needed.  Consider following up with neurology regarding seizure and possible workup for a seizure disorder.  1 week would be an appropriate timeframe.   ED Discharge Orders    None       Mancel BaleWentz, Addelyn Alleman,  MD 11/21/17 1557

## 2017-11-21 NOTE — ED Notes (Signed)
Patient given discharge instructions and verbalized understanding.  Patient stable to discharge at this time.  Patient is alert and oriented to baseline.  No distressed noted at this time.  All belongings taken with the patient at discharge.   

## 2017-11-21 NOTE — Discharge Instructions (Signed)
Seizures can be caused by various things.  Make sure that you are getting plenty of rest, eating and drinking regularly, avoiding illegal drugs, and trying to reduce the stress in your life.  Return here, if needed, for problems.
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
## 2018-04-12 ENCOUNTER — Encounter: Payer: Self-pay | Admitting: Medical

## 2018-04-12 ENCOUNTER — Ambulatory Visit (INDEPENDENT_AMBULATORY_CARE_PROVIDER_SITE_OTHER): Payer: Self-pay | Admitting: Medical

## 2018-04-12 VITALS — BP 119/75 | HR 80 | Temp 98.6°F | Resp 16 | Ht 71.0 in | Wt 174.6 lb

## 2018-04-12 DIAGNOSIS — F419 Anxiety disorder, unspecified: Secondary | ICD-10-CM

## 2018-04-12 DIAGNOSIS — F329 Major depressive disorder, single episode, unspecified: Secondary | ICD-10-CM

## 2018-04-12 DIAGNOSIS — Z87898 Personal history of other specified conditions: Secondary | ICD-10-CM

## 2018-04-12 DIAGNOSIS — F32A Depression, unspecified: Secondary | ICD-10-CM

## 2018-04-12 NOTE — Patient Instructions (Signed)
For your history of remote seizure following abrupt discontinuation of high-dose benzodiazepine, I would like to review prior records from your former psychiatrist.  Also current psychiatry notes might be helpful in the event current psychiatrist mentioned your former benzodiazepine use and subsequent seizure when you stop the medication.  This would be helpful in determining whether or not you need to see a neurologist to fill out the DOT form.  I do want you to get your psychiatrist to fill out the emotional health/mental health portion.  Also I want [facility] the substance abuse clinic to fill out the substance abuse section.  Once I have the filled out portions from the psychiatrist, substance abuse and your old records I think I will be able to fill out the remaining portion of the DOT form.  I will discuss this case with 1 of my supervisors and see if we would require a UDS.  Follow-up in 3 weeks or as needed.

## 2018-04-12 NOTE — Progress Notes (Signed)
Subjective:    Patient ID: Mark Strickland, male    DOB: 05-14-1991, 27 y.o.   MRN: [id number]  HPI  Pt in today for first time.  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 Pt does exercise regularly/walks at works. Gym twice a week.   Pt has history of some anxiety. Pt was on clonazepam in the past and he was on 1 mg 2-3 times a day.  Pt states psychiatrist told him he did not need to taper off the dose about year and half ago. Then 2-3 days later he had seizure. Person in car with him told him he had likely seizure. He had no incontinence. He did have ha afterward and felt fatigued. His muscle were sore next day.  No hx of head trauma. No history of any other suspicious seizure in the past  other than that one event..   This event happened January of 2018. No other events like this since.  Pt states his anxiety level is controlled now. He states finds ways to control symptoms well. He states he did not like the way he felt or functioned with the clonazepam.   Pt was never evaluated by ED. EMS came out and evaluated him that day of seizure.  Friend who was with him notified police they thought he had a seizure. Pt states when he was shopping for cars he found out his license was on hold status. Pt went to [facility] website to get dmv form.  Pt was seeing psychiatrist at [facility]. Pt does see current psychiatrist(different from one who took him off clonazepam) [address] [place] [address]. Pt is currently seeing provider named Tyler Aas.     His meds he used in past sertraline, gabapentin and clonazepam. Describes mood disorder, night terror and mood disorder.  Pt also admits that he had substance abuse problem in past and he went to [place] at [facility]. Abused pain meds. Clean for 6 months now.      Review of Systems  Constitutional: Negative for chills, fatigue and fever.  Cardiovascular: Negative for chest pain and palpitations.    Gastrointestinal: Negative for abdominal pain.  Musculoskeletal: Negative for back pain.  Skin: Negative for rash.  Neurological: Negative for dizziness, seizures, speech difficulty, weakness, light-headedness and headaches.  Hematological: Negative for adenopathy. Does not bruise/bleed easily.  Psychiatric/Behavioral: Positive for dysphoric mood and sleep disturbance. Negative for behavioral problems and confusion. The patient is nervous/anxious.     Past Medical History:  Diagnosis Date  . Anxiety   . Depressed   . Opiate addiction (HCC)   . Seizures (HCC)    childhood  . Substance abuse (HCC)    History abuse hx. Clean for 6 months.     Social History   Socioeconomic History  . Marital status: Single    Spouse name: Not on file  . Number of children: Not on file  . Years of education: Not on file  . Highest education level: Not on file  Occupational History  . Not on file  Social Needs  . Financial resource strain: Not on file  . Food insecurity:    Worry: Not on file    Inability: Not on file  . Transportation needs:    Medical: Not on file    Non-medical: Not on file  Tobacco Use  . Smoking status: Former Smoker    Packs/day: 0.50    Years: 8.00    Pack years: 4.00  Types: Cigarettes    Last attempt to quit: 11/12/2017    Years since quitting: 0.4  . Smokeless tobacco: Never Used  Substance and Sexual Activity  . Alcohol use: Not Currently  . Drug use: Not Currently    Types: IV, Hydrocodone, Oxycodone    Comment: Heroin   . Sexual activity: Yes  Lifestyle  . Physical activity:    Days per week: Not on file    Minutes per session: Not on file  . Stress: Not on file  Relationships  . Social connections:    Talks on phone: Not on file    Gets together: Not on file    Attends religious service: Not on file    Active member of club or organization: Not on file    Attends meetings of clubs or organizations: Not on file    Relationship status: Not on  file  . Intimate partner violence:    Fear of current or ex partner: Not on file    Emotionally abused: Not on file    Physically abused: Not on file    Forced sexual activity: Not on file  Other Topics Concern  . Not on file  Social History Narrative  . Not on file    History reviewed. No pertinent surgical history.  Family History  Family history unknown: Yes    No Known Allergies  Current Outpatient Medications on File Prior to Visit  Medication Sig Dispense Refill  . gabapentin (NEURONTIN) 300 MG capsule Take 600 mg by mouth 3 (three) times daily.     . sertraline (ZOLOFT) 50 MG tablet Take 3 tablets (150 mg total) by mouth daily. (Patient taking differently: Take 200 mg by mouth daily. ) 90 tablet 0  . diphenhydrAMINE (BENADRYL) 25 mg capsule Take 25 mg by mouth every 6 (six) hours as needed for allergies.    Marland Kitchen. ibuprofen (ADVIL,MOTRIN) 200 MG tablet Take 200 mg by mouth every 6 (six) hours as needed for moderate pain.    . magnesium oxide (MAG-OX) 400 (241.3 Mg) MG tablet Take 1 tablet (400 mg total) by mouth daily. (Patient not taking: Reported on 11/21/2017) 7 tablet 0  . mirtazapine (REMERON) 15 MG tablet Take 1 tablet (15 mg total) by mouth at bedtime. (Patient not taking: Reported on 04/12/2018) 30 tablet 0   No current facility-administered medications on file prior to visit.     BP 119/75   Pulse 80   Temp 98.6 F (37 C) (Oral)   Resp 16   Ht 5\' 11"  (1.803 m)   Wt 174 lb 9.6 oz (79.2 kg)   SpO2 98%   BMI 24.35 kg/m        Objective:   Physical Exam  General Mental Status- Alert. General Appearance- Not in acute distress.   Skin General: Color- Normal Color. Moisture- Normal Moisture.  Neck Carotid Arteries- Normal color. Moisture- Normal Moisture. No carotid bruits. No JVD.  Chest and Lung Exam Auscultation: Breath Sounds:-Normal.  Cardiovascular Auscultation:Rythm- Regular. Murmurs & Other Heart Sounds:Auscultation of the heart reveals- No  Murmurs.  Abdomen Inspection:-Inspeection Normal. Palpation/Percussion:Note:No mass. Palpation and Percussion of the abdomen reveal- Non Tender, Non Distended + BS, no rebound or guarding.    Neurologic Cranial Nerve exam:- CN III-XII intact(No nystagmus), symmetric smile. Strength:- 5/5 equal and symmetric strength both upper and lower extremities.      Assessment & Plan:  For your history of remote seizure following abrupt discontinuation of high-dose benzodiazepine, I would like to review prior  records from your former psychiatrist.  Also current psychiatry notes might be helpful in the event current psychiatrist mentioned your former benzodiazepine use and subsequent seizure when you stop the medication.  This would be helpful in determining whether or not you need to see a neurologist to fill out the DOT form.  I do want you to get your psychiatrist to fill out the emotional health/mental health portion.  Also I want [facility] substance abuse clinic to fill out the substance abuse section.  Once I have the filled out portions from the psychiatrist, substance abuse and your old records I think I will be able to fill out the remaining portion of the DOT form.  I will discuss this case with 1 of my supervisors and see if we would require a UDS.  Follow-up in 3 weeks or as needed.  Esperanza Richters, PA-C

## 2018-04-23 ENCOUNTER — Telehealth: Payer: Self-pay | Admitting: *Deleted

## 2018-04-23 NOTE — Telephone Encounter (Signed)
Received Medical records from [facility]; forwarded to provider/SLS 05/06

## 2018-04-29 ENCOUNTER — Telehealth: Payer: Self-pay | Admitting: Medical

## 2018-04-29 NOTE — Telephone Encounter (Signed)
Records reviewed. I could not find record of seizure or history behind seizure?? Records will be scanned.   Records we were not very legible.

## 2018-04-30 ENCOUNTER — Ambulatory Visit: Payer: Self-pay | Admitting: Medical

## 2018-05-18 ENCOUNTER — Ambulatory Visit: Payer: Self-pay | Admitting: Medical

## 2018-05-29 ENCOUNTER — Ambulatory Visit: Payer: Self-pay | Admitting: Medical

## 2018-05-29 DIAGNOSIS — Z0289 Encounter for other administrative examinations: Secondary | ICD-10-CM

## 2018-05-30 ENCOUNTER — Encounter: Payer: Self-pay | Admitting: Medical

## 2018-07-31 IMAGING — CT CT HEAD W/O CM
3 of 4 series · 14 of 47 positions shown, 16 images · non-contrast
Comparison: CT HEAD August 14, 2017

CLINICAL DATA: Altered level of consciousness. History of drug
abuse and seizures.

EXAM:
CT HEAD WITHOUT CONTRAST
TECHNIQUE: Contiguous axial images were obtained from the base of the skull
through the vertex without intravenous contrast.

[Series 2: head w/o · axial · non-contrast · 0.45mm/px · z∈[-35,+90]mm · 8 of 33 slices shown, 10 images]
[im 4/33  brain]
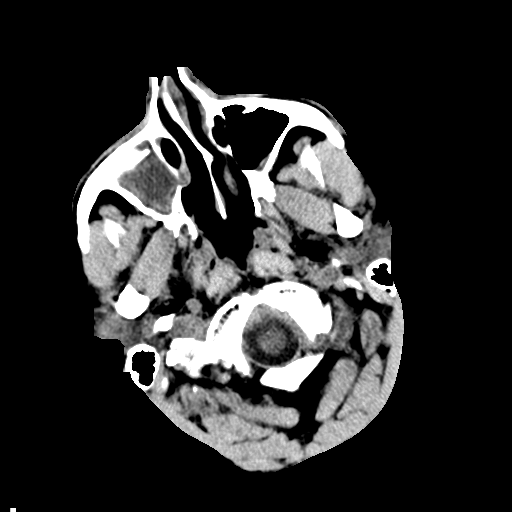
[im 4/33  bone]
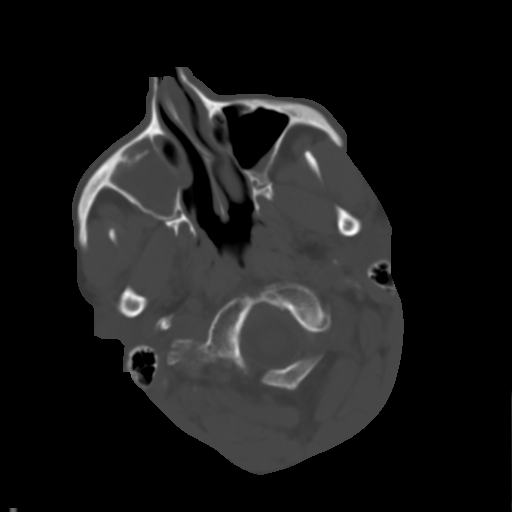
[im 7/33  brain]
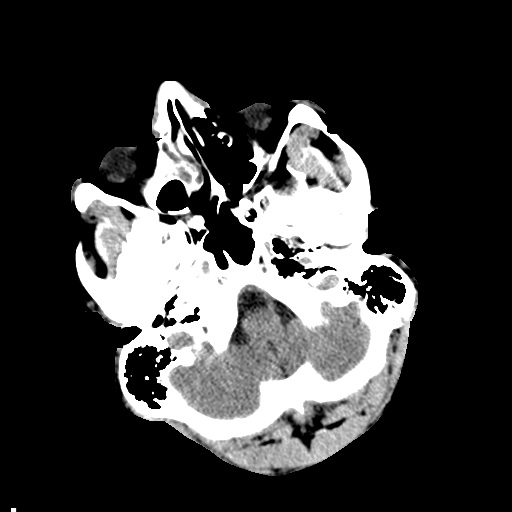
[im 10/33  brain]
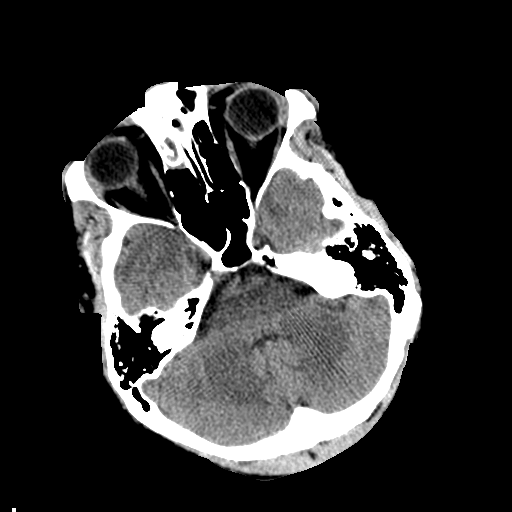
[im 13/33  brain]
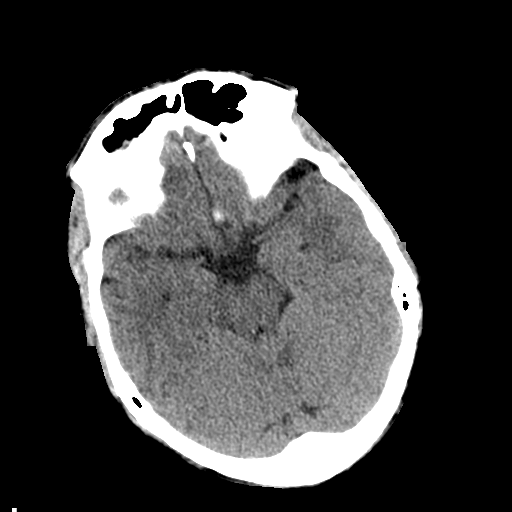
[im 20/33  brain]
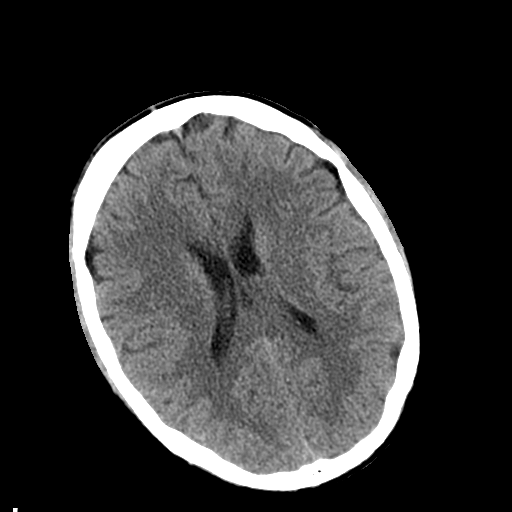
[im 20/33  bone]
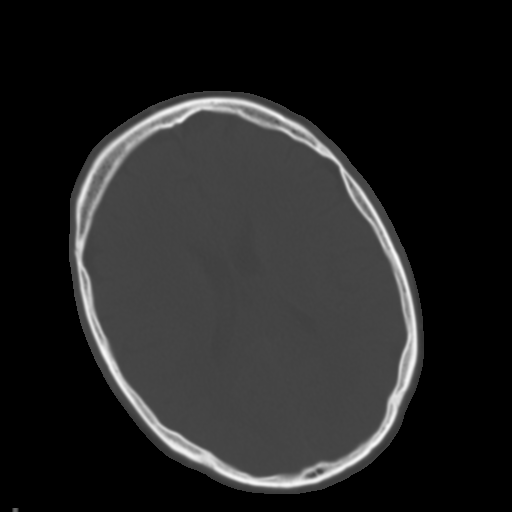
[im 23/33  brain]
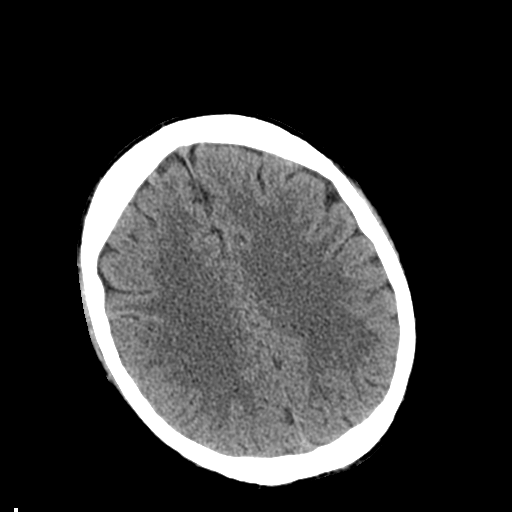
[im 26/33  brain]
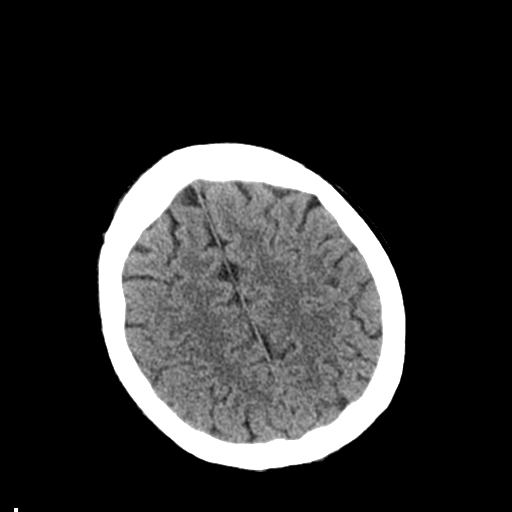
[im 29/33  brain]
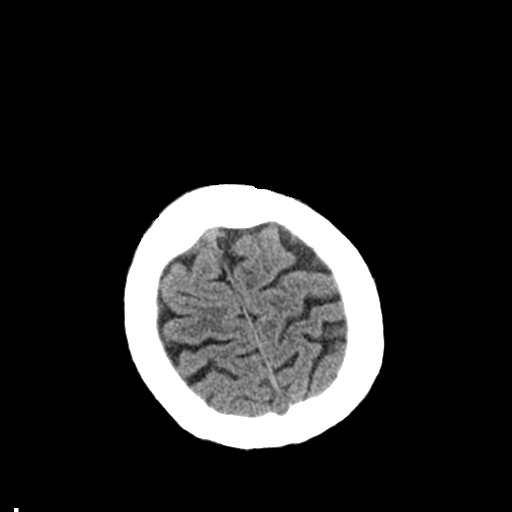

[Series 4: coronal · coronal · 0.37mm/px · 3 of 68 slices shown]
[im 23/68  brain]
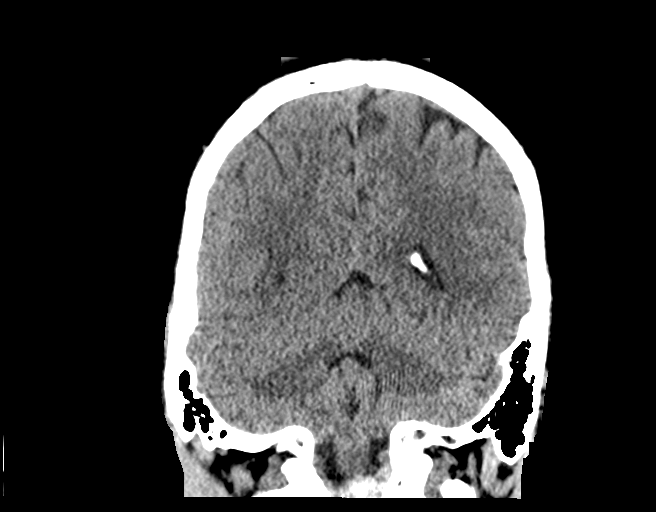
[im 30/68  brain]
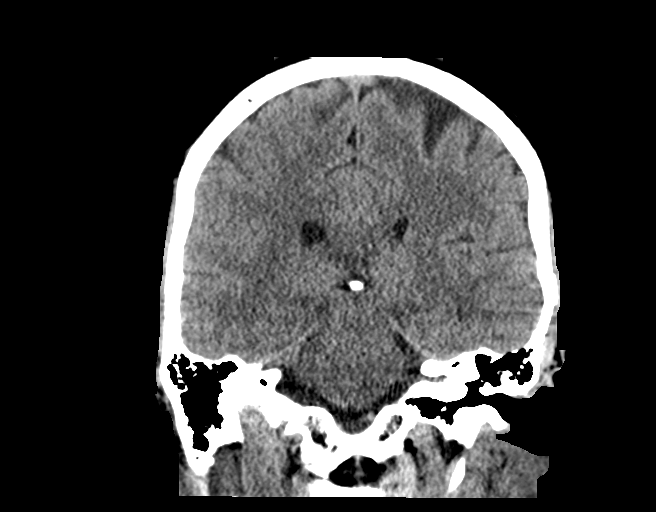
[im 38/68  brain]
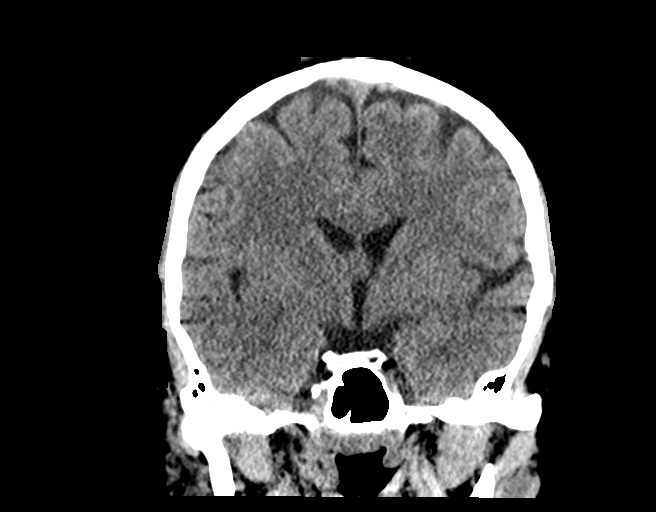

[Series 5: sagittal · sagittal · 0.36mm/px · 3 of 64 slices shown]
[im 22/64  brain]
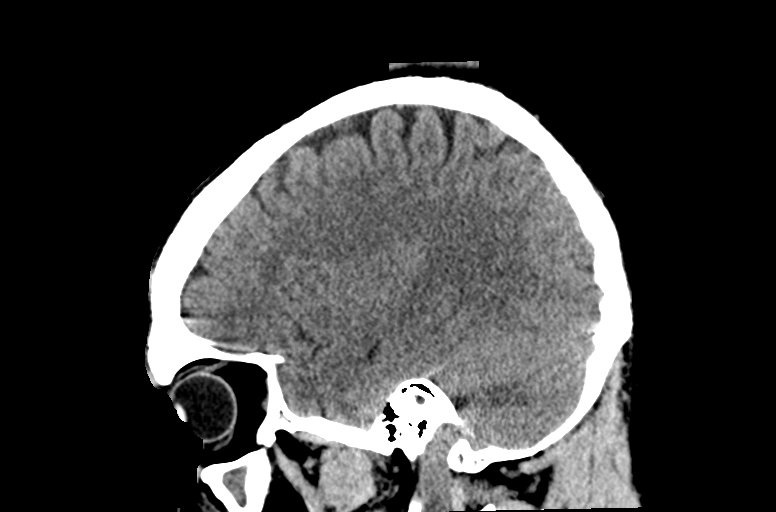
[im 32/64  brain]
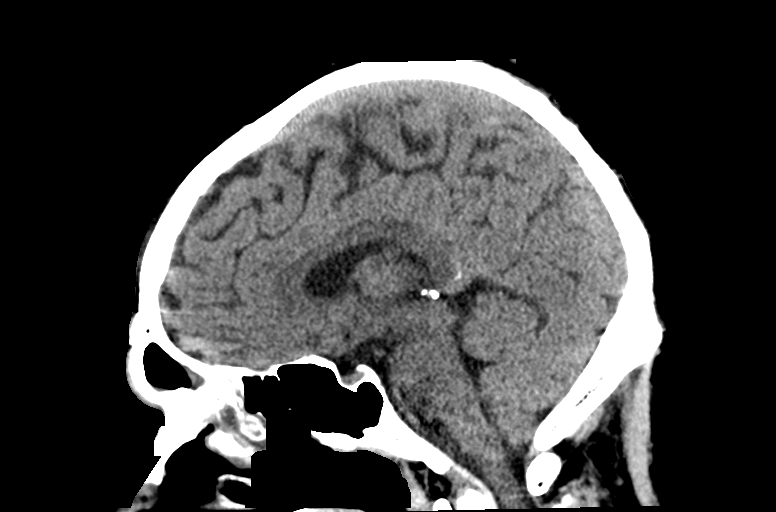
[im 43/64  brain]
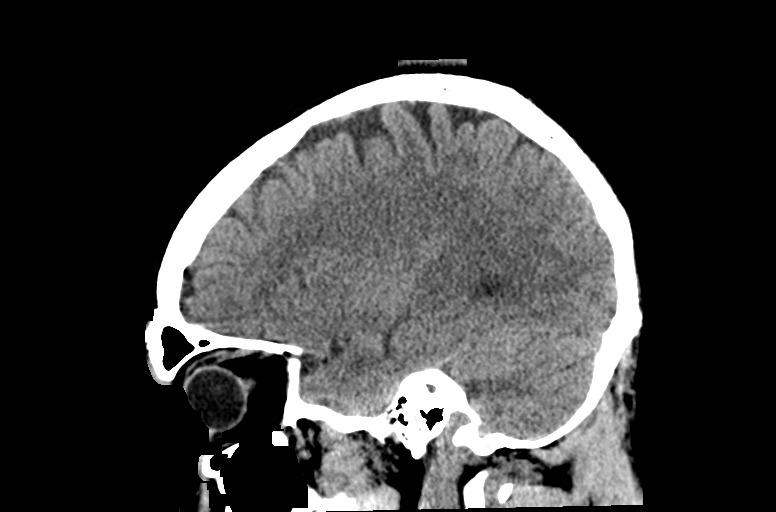

[14 of 47 positions shown; findings below may reference images not displayed]

FINDINGS: BRAIN: No intraparenchymal hemorrhage, mass effect nor midline
shift. The ventricles and sulci are normal. No acute large vascular
territory infarcts. No abnormal extra-axial fluid collections. Basal
cisterns are patent.

VASCULAR: Unremarkable.

SKULL/SOFT TISSUES: No skull fracture. No significant soft tissue
swelling.

ORBITS/SINUSES: The included ocular globes and orbital contents are
normal.Soft tissue opacifies and expands the RIGHT maxillary sinus
with RIGHT frontal and ethmoid sinusitis. Mild LEFT maxillary sinus
mucosal thickening.

OTHER: None.
IMPRESSION: 1. Normal noncontrast CT HEAD.
2. RIGHT maxillary suspected mucocele resulting in obstructive RIGHT
fronto ethmoid sinusitis.

## 2018-12-19 ENCOUNTER — Emergency Department (HOSPITAL_COMMUNITY): Payer: BLUE CROSS/BLUE SHIELD

## 2018-12-19 ENCOUNTER — Encounter (HOSPITAL_COMMUNITY): Payer: Self-pay | Admitting: Emergency Medicine

## 2018-12-19 ENCOUNTER — Emergency Department (HOSPITAL_COMMUNITY)
Admission: EM | Admit: 2018-12-19 | Discharge: 2018-12-19 | Disposition: A | Discharge status: Home / Self Care | Payer: BLUE CROSS/BLUE SHIELD | Attending: Emergency Medicine | Admitting: Emergency Medicine

## 2018-12-19 DIAGNOSIS — Y990 Civilian activity done for income or pay: Secondary | ICD-10-CM | POA: Diagnosis not present

## 2018-12-19 DIAGNOSIS — Z79899 Other long term (current) drug therapy: Secondary | ICD-10-CM | POA: Insufficient documentation

## 2018-12-19 DIAGNOSIS — Y9289 Other specified places as the place of occurrence of the external cause: Secondary | ICD-10-CM | POA: Diagnosis not present

## 2018-12-19 DIAGNOSIS — Y9389 Activity, other specified: Secondary | ICD-10-CM | POA: Diagnosis not present

## 2018-12-19 DIAGNOSIS — W458XXA Other foreign body or object entering through skin, initial encounter: Secondary | ICD-10-CM | POA: Insufficient documentation

## 2018-12-19 DIAGNOSIS — Z87891 Personal history of nicotine dependence: Secondary | ICD-10-CM | POA: Insufficient documentation

## 2018-12-19 DIAGNOSIS — S61432A Puncture wound without foreign body of left hand, initial encounter: Secondary | ICD-10-CM | POA: Diagnosis present

## 2018-12-19 MED ORDER — LIDOCAINE HCL (PF) 1 % IJ SOLN
5.0000 mL | Freq: Once | INTRAMUSCULAR | Status: AC
  Administered 2018-12-19: 5 mL
  Filled 2018-12-19: qty 5

## 2018-12-19 MED ORDER — CEPHALEXIN 250 MG PO CAPS
500.0000 mg | ORAL_CAPSULE | Freq: Once | ORAL | Status: AC
  Administered 2018-12-19: 500 mg via ORAL
  Filled 2018-12-19: qty 2

## 2018-12-19 MED ORDER — ACETAMINOPHEN 325 MG PO TABS
650.0000 mg | ORAL_TABLET | Freq: Once | ORAL | Status: AC
  Administered 2018-12-19: 650 mg via ORAL
  Filled 2018-12-19: qty 2

## 2018-12-19 MED ORDER — CEPHALEXIN 500 MG PO CAPS: 500.0000 mg | ORAL_CAPSULE | Freq: Three times a day (TID) | ORAL | 0 refills | Status: AC

## 2018-12-19 MED ORDER — MELOXICAM 7.5 MG PO TABS: 7.5000 mg | ORAL_TABLET | Freq: Every day | ORAL | 0 refills | Status: AC

## 2018-12-19 NOTE — ED Notes (Signed)
Patient verbalizes understanding of discharge instructions. Opportunity for questioning and answers were provided. Armband removed by staff, pt discharged from ED ambulatory.   

## 2018-12-19 NOTE — ED Triage Notes (Addendum)
Patient reporting that he was doing some yard work when he slipped and hit his right side chest on a tree and found out that he has a laceration to his right hand (between the thumb and index finger)

## 2018-12-19 NOTE — ED Notes (Signed)
Irrigation complete.

## 2018-12-19 NOTE — Discharge Instructions (Addendum)
Please read instructions below. Soak your hand in warm soapy water multiple times per day to keep it clean and allow drainage. Take the antibiotic, Keflex, 3 times daily until it is completely gone. You can take the meloxicam once daily with meals as needed for pain.  You can take Tylenol in addition to this as needed for additional pain relief. Apply ice for 20 minutes at a time. Call the hand specialist as needed for follow-up. If you observe any signs of infection including worsening redness or swelling, purulent drainage, fevers, report back to the emergency department.

## 2018-12-19 NOTE — ED Provider Notes (Signed)
[facility]   CSN: [id number] Arrival date & time: 12/19/18  1611     History   Chief Complaint No chief complaint on file.   HPI Mark Strickland is a 28 y.o. male with past medical history of polysubstance abuse, presenting to the emergency department with puncture wound to the left hand that occurred prior to arrival.  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  He states his spikes on his shoes slipped and he swung down striking the tree at a lower place.  He states when he caught himself on the tree his left hand hit a piece of branch sticking out.  He has a piece of wood sticking out  a small amount from the skin tween his first and second digits, however when he grasped and pulled it out least 1 inch of wood was embedded in the hand.  He has associated pain to this area.  Took ibuprofen prior to arrival.  He states he also scratched his right anterior chest, however has minimal pain.  No difficulty breathing.  Last tetanus was within 5 years ago.  No allergies to antibiotics.  Left-hand-dominant.  The history is provided by the patient.    Past Medical History:  Diagnosis Date  . Anxiety   . Depressed   . Opiate addiction (HCC)   . Seizures (HCC)    childhood  . Substance abuse (HCC)    History abuse hx. Clean for 6 months.    Patient Active Problem List   Diagnosis Date Noted  . Encephalopathy 08/18/2017  . Altered mental status   . Polysubstance overdose 08/15/2017  . Rhabdomyolysis 08/15/2017  . Elevated LFTs 08/14/2017  . Fever 08/14/2017  . Drug overdose, intentional (HCC) 12/03/2015  . Encephalopathy, toxic 12/03/2015    No past surgical history on file.      Home Medications    Prior to Admission medications   Medication Sig Start Date End Date Taking? Authorizing Provider  cephALEXin (KEFLEX) 500 MG capsule Take 1 capsule (500 mg total) by mouth 3 (three) times daily for 5 days. 12/19/18  12/24/18  Ellerie Arenz, [place] N, PA-C  diphenhydrAMINE (BENADRYL) 25 mg capsule Take 25 mg by mouth every 6 (six) hours as needed for allergies.    [provider]  gabapentin (NEURONTIN) 300 MG capsule Take 600 mg by mouth 3 (three) times daily.     [provider]  ibuprofen (ADVIL,MOTRIN) 200 MG tablet Take 200 mg by mouth every 6 (six) hours as needed for moderate pain.    [provider]  magnesium oxide (MAG-OX) 400 (241.3 Mg) MG tablet Take 1 tablet (400 mg total) by mouth daily. Patient not taking: Reported on 11/21/2017 08/17/17   Dorothea OgleMyers, Iskra M, MD  meloxicam (MOBIC) 7.5 MG tablet Take 1 tablet (7.5 mg total) by mouth daily for 7 days. 12/19/18 12/26/18  Shamyah Stantz, [place] N, PA-C  mirtazapine (REMERON) 15 MG tablet Take 1 tablet (15 mg total) by mouth at bedtime. Patient not taking: Reported on 04/12/2018 08/21/17   Noralee Stainhoi, Jennifer, DO  sertraline (ZOLOFT) 50 MG tablet Take 3 tablets (150 mg total) by mouth daily. Patient taking differently: Take 200 mg by mouth daily.  08/21/17   Noralee Stainhoi, Jennifer, DO    Family History Family History  Family history unknown: Yes    Social History Social History   Tobacco Use  . Smoking status: Former Smoker    Packs/day: 0.50  Years: 8.00    Pack years: 4.00    Types: Cigarettes    Last attempt to quit: 11/12/2017    Years since quitting: 1.1  . Smokeless tobacco: Never Used  Substance Use Topics  . Alcohol use: Not Currently  . Drug use: Not Currently    Types: IV, Hydrocodone, Oxycodone    Comment: Heroin      Allergies   Patient has no known allergies.   Review of Systems Review of Systems  Respiratory: Negative for shortness of breath.   Skin: Positive for wound.     Physical Exam Updated Vital Signs BP 136/84 (BP Location: Right Arm)   Pulse 77   Temp 98.3 F (36.8 C) (Oral)   Resp 16   SpO2 97%   Physical Exam Vitals signs and nursing note reviewed.  Constitutional:      General: He is not in acute  distress.    Appearance: He is well-developed.  HENT:     Head: Normocephalic and atraumatic.  Eyes:     Conjunctiva/sclera: Conjunctivae normal.  Cardiovascular:     Rate and Rhythm: Normal rate.  Pulmonary:     Effort: Pulmonary effort is normal.  Musculoskeletal:     Comments: Left hand with ~1cm puncture wound between the first and second digits. Multiple small flecks of dirt around the wound edge,  No obvious foreign bodies requiring removal.  Normal range of motion of digits and hand. Nl sensation. There is a superficial abrasion to the right anterior chest.  No crepitus or bruising.  Symmetric chest expansion.  Lungs are clear.  Minimal tenderness.  Neurological:     Mental Status: He is alert.  Psychiatric:        Mood and Affect: Mood normal.        Behavior: Behavior normal.      ED Treatments / Results  Labs (all labs ordered are listed, but only abnormal results are displayed) Labs Reviewed - No data to display  EKG None  Radiology Dg Hand Complete Left  Result Date: 12/19/2018 CLINICAL DATA:  Puncture wound EXAM: LEFT HAND - COMPLETE 3+ VIEW COMPARISON:  None. FINDINGS: There is no evidence of fracture or dislocation. There is no evidence of arthropathy or other focal bone abnormality. Soft tissues are unremarkable. IMPRESSION: Negative. Electronically Signed   By: Jasmine Pang M.D.   On: 12/19/2018 17:23    Procedures Irrigation Date/Time: 12/19/2018 7:39 PM Performed by: Haven Foss, [place] N, PA-C Authorized by: Harman Ferrin, [place] N, PA-C  Consent: Verbal consent obtained. Risks and benefits: risks, benefits and alternatives were discussed Consent given by: patient Patient understanding: patient states understanding of the procedure being performed Local anesthesia used: yes  Anesthesia: Local anesthesia used: yes Local Anesthetic: lidocaine 1% without epinephrine Anesthetic total: 2 mL  Sedation: Patient sedated: no  Patient tolerance: Patient  tolerated the procedure well with no immediate complications Comments: Wound irrigated with an 18-gauge Angiocath.  No evidence of retained foreign bodies.    (including critical care time)  Medications Ordered in ED Medications  lidocaine (PF) (XYLOCAINE) 1 % injection 5 mL (5 mLs Infiltration Given 12/19/18 1746)  acetaminophen (TYLENOL) tablet 650 mg (650 mg Oral Given 12/19/18 1916)  cephALEXin (KEFLEX) capsule 500 mg (500 mg Oral Given 12/19/18 1916)    Initial Impression / Assessment and Plan / ED Course  I have reviewed the triage vital signs and the nursing notes.  Pertinent labs & imaging results that were available during my care of the patient  were reviewed by me and considered in my medical decision making (see chart for details).  Clinical Course as of Dec 19 1938  Wed Dec 19, 2018  2505 Patient discussed with Dr. Janee Morn.  Agrees with plan for p.o. antibiotics, soapy warm water soaks, follow-up in clinic as needed for any complications.   [JR]  1940 Pharmacist agrees Keflex is an appropriate antibiotic for coverage.   [JR]    Clinical Course User Index [JR] Ivar Domangue, [place] N, PA-C    Patient with puncture wound to the left hand from wood after slipping from a tree while working.  Patient also with professional abrasion to the right chest, however no obvious contusion and no respiratory symptoms.  Lungs are clear.  Offered chest x-ray, however patient declines, states his symptoms are very mild and does not think he hit his chest very hard.  Left hand was copiously irrigated with 18-gauge Angiocath.  No obvious retained foreign bodies, however wound edge was slightly dirty.  Preserved range of motion, neurovascularly intact.  No evidence of tendon damage.  X-rays negative.  Tetanus is up-to-date.  Dose of antibiotic provided in the ED.  Will discharge with Keflex per Dr. Christoper Fabian recommendation.  Does not have PCP for follow-up.  Will provide referral to hand specialist, as  needed for any complications.  Recommend patient port to the ED for any signs of infection.  Patient verbalizes understanding and agrees with care plan.  Discussed results, findings, treatment and follow up. Patient advised of return precautions. Patient verbalized understanding and agreed with plan.  Final Clinical Impressions(s) / ED Diagnoses   Final diagnoses:  Puncture wound of left hand without foreign body, initial encounter    ED Discharge Orders         Ordered    cephALEXin (KEFLEX) 500 MG capsule  3 times daily     12/19/18 1933    meloxicam (MOBIC) 7.5 MG tablet  Daily     12/19/18 1933           Emiliano Welshans, [place] N, [place] 12/19/18 1940    Rolan Bucco, MD 12/20/18 [phone]

## 2019-02-13 ENCOUNTER — Encounter: Payer: BLUE CROSS/BLUE SHIELD | Admitting: Family

## 2019-02-13 NOTE — Progress Notes (Deleted)
Subjective:    Patient ID: Mark Strickland, male    DOB: [birth year], 28 y.o.   MRN: [id number]  No chief complaint on file.   HPI:  Mark Strickland is a 28 y.o. male with previous medical history of polysubstance abuse and  rhabdomyolysis who presents today for initial evaluation of Hepatitis C.  Mark Strickland has initial Hepatitis C blood work completed on 08/15/17 with a positive Hepatitis C antibody with confirmation on 08/18/18 with a viral load of 14,700 and Genotype of 1a. Most recent blood work completed on 12/28/18 with a positive Hepatitis C antibody and HCV RNA detected.  Mark Strickland risk factors include IV drug use. Denies tattoos, blood transfusions prior to 1992, sharing of toothbrushes or razors, or sexual contact with a positive partner.    No Known Allergies    Outpatient Medications Prior to Visit  Medication Sig Dispense Refill  . diphenhydrAMINE (BENADRYL) 25 mg capsule Take 25 mg by mouth every 6 (six) hours as needed for allergies.    Marland Kitchen gabapentin (NEURONTIN) 300 MG capsule Take 600 mg by mouth 3 (three) times daily.     Marland Kitchen ibuprofen (ADVIL,MOTRIN) 200 MG tablet Take 200 mg by mouth every 6 (six) hours as needed for moderate pain.    . magnesium oxide (MAG-OX) 400 (241.3 Mg) MG tablet Take 1 tablet (400 mg total) by mouth daily. (Patient not taking: Reported on 11/21/2017) 7 tablet 0  . mirtazapine (REMERON) 15 MG tablet Take 1 tablet (15 mg total) by mouth at bedtime. (Patient not taking: Reported on 04/12/2018) 30 tablet 0  . sertraline (ZOLOFT) 50 MG tablet Take 3 tablets (150 mg total) by mouth daily. (Patient taking differently: Take 200 mg by mouth daily. ) 90 tablet 0   No facility-administered medications prior to visit.      Past Medical History:  Diagnosis Date  . Anxiety   . Depressed   . Opiate addiction (HCC)   . Seizures (HCC)    childhood  . Substance abuse (HCC)    History abuse hx. Clean for 6 months.      No past surgical history on  file.    Family History  Family history unknown: Yes      Social History   Socioeconomic History  . Marital status: Single    Spouse name: Not on file  . Number of children: Not on file  . Years of education: Not on file  . Highest education level: Not on file  Occupational History  . Not on file  Social Needs  . Financial resource strain: Not on file  . Food insecurity:    Worry: Not on file    Inability: Not on file  . Transportation needs:    Medical: Not on file    Non-medical: Not on file  Tobacco Use  . Smoking status: Former Smoker    Packs/day: 0.50    Years: 8.00    Pack years: 4.00    Types: Cigarettes    Last attempt to quit: 11/12/2017    Years since quitting: 1.2  . Smokeless tobacco: Never Used  Substance and Sexual Activity  . Alcohol use: Not Currently  . Drug use: Not Currently    Types: IV, Hydrocodone, Oxycodone    Comment: Heroin   . Sexual activity: Yes  Lifestyle  . Physical activity:    Days per week: Not on file    Minutes per session: Not on file  . Stress: Not on file  Relationships  .  Social connections:    Talks on phone: Not on file    Gets together: Not on file    Attends religious service: Not on file    Active member of club or organization: Not on file    Attends meetings of clubs or organizations: Not on file    Relationship status: Not on file  . Intimate partner violence:    Fear of current or ex partner: Not on file    Emotionally abused: Not on file    Physically abused: Not on file    Forced sexual activity: Not on file  Other Topics Concern  . Not on file  Social History Narrative  . Not on file      Review of Systems  Constitutional: Negative for chills, diaphoresis, fatigue and fever.  Respiratory: Negative for cough, chest tightness, shortness of breath and wheezing.   Cardiovascular: Negative for chest pain.  Gastrointestinal: Negative for abdominal distention, abdominal pain, constipation, diarrhea,  nausea and vomiting.  Neurological: Negative for weakness and headaches.  Hematological: Does not bruise/bleed easily.       Objective:    There were no vitals taken for this visit. Nursing note and vital signs reviewed.  Physical Exam Constitutional:      General: He is not in acute distress.    Appearance: He is well-developed.  Cardiovascular:     Rate and Rhythm: Normal rate and regular rhythm.     Heart sounds: Normal heart sounds. No murmur. No friction rub. No gallop.   Pulmonary:     Effort: Pulmonary effort is normal. No respiratory distress.     Breath sounds: Normal breath sounds. No wheezing or rales.  Chest:     Chest wall: No tenderness.  Abdominal:     General: Bowel sounds are normal. There is no distension.     Palpations: Abdomen is soft. There is no mass.     Tenderness: There is no abdominal tenderness. There is no guarding or rebound.  Skin:    General: Skin is warm and dry.  Neurological:     Mental Status: He is alert and oriented to person, place, and time.  Psychiatric:        Behavior: Behavior normal.        Thought Content: Thought content normal.        Judgment: Judgment normal.         Assessment & Plan:   Problem List Items Addressed This Visit    None       I am having Mark Strickland maintain his magnesium oxide, gabapentin, mirtazapine, sertraline, ibuprofen, and diphenhydrAMINE.   No orders of the defined types were placed in this encounter.    Follow-up: No follow-ups on file.    Marcos Eke, MSN, FNP-C Nurse Practitioner [facility] Office phone: [phone] Pager: [phone] [facility] Main number: [phone]

## 2019-03-18 ENCOUNTER — Other Ambulatory Visit: Payer: Self-pay

## 2019-03-18 ENCOUNTER — Emergency Department (HOSPITAL_COMMUNITY): Payer: Self-pay

## 2019-03-18 ENCOUNTER — Encounter (HOSPITAL_COMMUNITY): Payer: Self-pay | Admitting: Internal Medicine

## 2019-03-18 ENCOUNTER — Inpatient Hospital Stay (HOSPITAL_COMMUNITY)
Admission: EM | Admit: 2019-03-18 | Discharge: 2019-03-22 | DRG: 917 | Disposition: A | Discharge status: Other Acute Inpatient Hospital | Payer: Self-pay | Attending: Family Medicine | Admitting: Family Medicine

## 2019-03-18 DIAGNOSIS — Z20828 Contact with and (suspected) exposure to other viral communicable diseases: Secondary | ICD-10-CM | POA: Diagnosis present

## 2019-03-18 DIAGNOSIS — D649 Anemia, unspecified: Secondary | ICD-10-CM | POA: Diagnosis present

## 2019-03-18 DIAGNOSIS — J189 Pneumonia, unspecified organism: Secondary | ICD-10-CM

## 2019-03-18 DIAGNOSIS — E876 Hypokalemia: Secondary | ICD-10-CM | POA: Diagnosis present

## 2019-03-18 DIAGNOSIS — J9601 Acute respiratory failure with hypoxia: Secondary | ICD-10-CM | POA: Diagnosis not present

## 2019-03-18 DIAGNOSIS — J690 Pneumonitis due to inhalation of food and vomit: Secondary | ICD-10-CM | POA: Diagnosis present

## 2019-03-18 DIAGNOSIS — Z915 Personal history of self-harm: Secondary | ICD-10-CM

## 2019-03-18 DIAGNOSIS — T1491XA Suicide attempt, initial encounter: Secondary | ICD-10-CM

## 2019-03-18 DIAGNOSIS — T50901A Poisoning by unspecified drugs, medicaments and biological substances, accidental (unintentional), initial encounter: Secondary | ICD-10-CM | POA: Diagnosis not present

## 2019-03-18 DIAGNOSIS — T401X1A Poisoning by heroin, accidental (unintentional), initial encounter: Principal | ICD-10-CM | POA: Diagnosis present

## 2019-03-18 HISTORY — DX: Opioid abuse, uncomplicated: F11.10

## 2019-03-18 LAB — D-DIMER, QUANTITATIVE: D-Dimer, Quant: 1.09 ug/mL-FEU — ABNORMAL HIGH (ref 0.00–0.50)

## 2019-03-18 LAB — RESPIRATORY PANEL BY PCR
Adenovirus: NOT DETECTED
Bordetella pertussis: NOT DETECTED
CORONAVIRUS 229E-RVPPCR: NOT DETECTED
CORONAVIRUS OC43-RVPPCR: NOT DETECTED
Chlamydophila pneumoniae: NOT DETECTED
Coronavirus HKU1: NOT DETECTED
Coronavirus NL63: NOT DETECTED
INFLUENZA B-RVPPCR: NOT DETECTED
Influenza A: NOT DETECTED
Metapneumovirus: NOT DETECTED
Mycoplasma pneumoniae: NOT DETECTED
Parainfluenza Virus 1: NOT DETECTED
Parainfluenza Virus 2: NOT DETECTED
Parainfluenza Virus 3: NOT DETECTED
Parainfluenza Virus 4: NOT DETECTED
Respiratory Syncytial Virus: NOT DETECTED
Rhinovirus / Enterovirus: NOT DETECTED

## 2019-03-18 LAB — CBC WITH DIFFERENTIAL/PLATELET
Abs Immature Granulocytes: 0.04 10*3/uL (ref 0.00–0.07)
Basophils Absolute: 0 10*3/uL (ref 0.0–0.1)
Basophils Relative: 0 %
Eosinophils Absolute: 0 10*3/uL (ref 0.0–0.5)
Eosinophils Relative: 0 %
HCT: 33.2 % — ABNORMAL LOW (ref 39.0–52.0)
Hemoglobin: 10.4 g/dL — ABNORMAL LOW (ref 13.0–17.0)
Immature Granulocytes: 1 %
Lymphocytes Relative: 16 %
Lymphs Abs: 1.4 10*3/uL (ref 0.7–4.0)
MCH: 29.7 pg (ref 26.0–34.0)
MCHC: 31.3 g/dL (ref 30.0–36.0)
MCV: 94.9 fL (ref 80.0–100.0)
MONOS PCT: 11 %
Monocytes Absolute: 0.9 10*3/uL (ref 0.1–1.0)
Neutro Abs: 6.3 10*3/uL (ref 1.7–7.7)
Neutrophils Relative %: 72 %
Platelets: 231 10*3/uL (ref 150–400)
RBC: 3.5 MIL/uL — ABNORMAL LOW (ref 4.22–5.81)
RDW: 12.1 % (ref 11.5–15.5)
WBC: 8.7 10*3/uL (ref 4.0–10.5)
nRBC: 0 % (ref 0.0–0.2)

## 2019-03-18 LAB — FERRITIN: Ferritin: 135 ng/mL (ref 24–336)

## 2019-03-18 LAB — BASIC METABOLIC PANEL
Anion gap: 8 (ref 5–15)
BUN: 27 mg/dL — ABNORMAL HIGH (ref 6–20)
CALCIUM: 8.1 mg/dL — AB (ref 8.9–10.3)
CO2: 28 mmol/L (ref 22–32)
CREATININE: 1.09 mg/dL (ref 0.61–1.24)
Chloride: 104 mmol/L (ref 98–111)
GFR calc Af Amer: >60 mL/min (ref >60)
GFR calc non Af Amer: >60 mL/min (ref >60)
Glucose, Bld: 116 mg/dL — ABNORMAL HIGH (ref 70–99)
Potassium: 3.4 mmol/L — ABNORMAL LOW (ref 3.5–5.1)
Sodium: 140 mmol/L (ref 135–145)

## 2019-03-18 LAB — CBG MONITORING, ED: Glucose-Capillary: 107 mg/dL — ABNORMAL HIGH (ref 70–99)

## 2019-03-18 LAB — RAPID URINE DRUG SCREEN, HOSP PERFORMED
Amphetamines: NOT DETECTED
Barbiturates: NOT DETECTED
Benzodiazepines: POSITIVE — AB
Cocaine: POSITIVE — AB
Opiates: POSITIVE — AB
Tetrahydrocannabinol: NOT DETECTED

## 2019-03-18 LAB — LACTATE DEHYDROGENASE: LDH: 201 U/L — ABNORMAL HIGH (ref 98–192)

## 2019-03-18 LAB — ETHANOL: Alcohol, Ethyl (B): <10 mg/dL (ref <10)

## 2019-03-18 LAB — INFLUENZA PANEL BY PCR (TYPE A & B)
Influenza A By PCR: NEGATIVE
Influenza B By PCR: NEGATIVE

## 2019-03-18 LAB — C-REACTIVE PROTEIN: CRP: 22.9 mg/dL — ABNORMAL HIGH (ref <1.0)

## 2019-03-18 LAB — PROCALCITONIN: Procalcitonin: 3.27 ng/mL

## 2019-03-18 LAB — STREP PNEUMONIAE URINARY ANTIGEN: Strep Pneumo Urinary Antigen: NEGATIVE

## 2019-03-18 LAB — MRSA PCR SCREENING: MRSA by PCR: NEGATIVE

## 2019-03-18 LAB — ACETAMINOPHEN LEVEL: Acetaminophen (Tylenol), Serum: <10 ug/mL — ABNORMAL LOW (ref 10–30)

## 2019-03-18 MED ORDER — DEXMEDETOMIDINE HCL IN NACL 200 MCG/50ML IV SOLN
0.2000 ug/kg/h | INTRAVENOUS | Status: DC
  Administered 2019-03-18: 0.2 ug/kg/h via INTRAVENOUS
  Filled 2019-03-18: qty 50

## 2019-03-18 MED ORDER — SODIUM CHLORIDE 0.9 % IV SOLN
1.0000 g | Freq: Once | INTRAVENOUS | Status: AC
  Administered 2019-03-18: 1 g via INTRAVENOUS
  Filled 2019-03-18: qty 10

## 2019-03-18 MED ORDER — NALOXONE HCL 0.4 MG/ML IJ SOLN: 0.4000 mg | INTRAMUSCULAR | Status: DC | PRN

## 2019-03-18 MED ORDER — BISACODYL 5 MG PO TBEC: 5.0000 mg | DELAYED_RELEASE_TABLET | Freq: Every day | ORAL | Status: DC | PRN

## 2019-03-18 MED ORDER — SODIUM CHLORIDE 0.9 % IV BOLUS
1000.0000 mL | Freq: Once | INTRAVENOUS | Status: AC
  Administered 2019-03-18: 1000 mL via INTRAVENOUS

## 2019-03-18 MED ORDER — STERILE WATER FOR INJECTION IJ SOLN
INTRAMUSCULAR | Status: AC
  Filled 2019-03-18: qty 10

## 2019-03-18 MED ORDER — VITAMIN B-1 100 MG PO TABS
100.0000 mg | ORAL_TABLET | Freq: Every day | ORAL | Status: DC
  Administered 2019-03-19 – 2019-03-22 (×4): 100 mg via ORAL
  Filled 2019-03-18 (×4): qty 1

## 2019-03-18 MED ORDER — ADULT MULTIVITAMIN W/MINERALS CH
1.0000 | ORAL_TABLET | Freq: Every day | ORAL | Status: DC
  Administered 2019-03-19 – 2019-03-22 (×4): 1 via ORAL
  Filled 2019-03-18 (×4): qty 1

## 2019-03-18 MED ORDER — SODIUM CHLORIDE 0.9 % IV SOLN
1.0000 g | INTRAVENOUS | Status: DC
  Administered 2019-03-19 – 2019-03-20 (×2): 1 g via INTRAVENOUS
  Filled 2019-03-18 (×2): qty 1

## 2019-03-18 MED ORDER — ONDANSETRON HCL 4 MG PO TABS: 4.0000 mg | ORAL_TABLET | Freq: Four times a day (QID) | ORAL | Status: DC | PRN

## 2019-03-18 MED ORDER — ZIPRASIDONE MESYLATE 20 MG IM SOLR
10.0000 mg | Freq: Once | INTRAMUSCULAR | Status: AC
  Administered 2019-03-18: 10 mg via INTRAMUSCULAR

## 2019-03-18 MED ORDER — FOLIC ACID 1 MG PO TABS
1.0000 mg | ORAL_TABLET | Freq: Every day | ORAL | Status: DC
  Administered 2019-03-19 – 2019-03-22 (×4): 1 mg via ORAL
  Filled 2019-03-18 (×4): qty 1

## 2019-03-18 MED ORDER — ACETAMINOPHEN 325 MG PO TABS
650.0000 mg | ORAL_TABLET | Freq: Four times a day (QID) | ORAL | Status: DC | PRN
  Administered 2019-03-18 – 2019-03-19 (×2): 650 mg via ORAL
  Filled 2019-03-18 (×2): qty 2

## 2019-03-18 MED ORDER — SODIUM CHLORIDE 0.9 % IV SOLN
500.0000 mg | Freq: Once | INTRAVENOUS | Status: AC
  Administered 2019-03-18: 500 mg via INTRAVENOUS
  Filled 2019-03-18: qty 500

## 2019-03-18 MED ORDER — ENOXAPARIN SODIUM 40 MG/0.4ML ~~LOC~~ SOLN
40.0000 mg | SUBCUTANEOUS | Status: DC
  Administered 2019-03-18 – 2019-03-21 (×4): 40 mg via SUBCUTANEOUS
  Filled 2019-03-18 (×5): qty 0.4

## 2019-03-18 MED ORDER — NALOXONE HCL 0.4 MG/ML IJ SOLN
0.4000 mg | Freq: Once | INTRAMUSCULAR | Status: AC
  Administered 2019-03-18: 0.4 mg via INTRAVENOUS
  Filled 2019-03-18: qty 1

## 2019-03-18 MED ORDER — MORPHINE SULFATE (PF) 2 MG/ML IV SOLN
2.0000 mg | INTRAVENOUS | Status: DC | PRN
  Administered 2019-03-19 – 2019-03-20 (×6): 2 mg via INTRAVENOUS
  Filled 2019-03-18 (×7): qty 1

## 2019-03-18 MED ORDER — MIDAZOLAM HCL 2 MG/2ML IJ SOLN
1.0000 mg | INTRAMUSCULAR | Status: DC | PRN
  Administered 2019-03-18: 2 mg via INTRAVENOUS
  Administered 2019-03-19: 1 mg via INTRAVENOUS
  Administered 2019-03-19 – 2019-03-20 (×2): 2 mg via INTRAVENOUS
  Filled 2019-03-18 (×4): qty 2

## 2019-03-18 MED ORDER — SODIUM CHLORIDE 0.9 % IV SOLN
INTRAVENOUS | Status: DC | PRN
  Administered 2019-03-18: 500 mL via INTRAVENOUS
  Administered 2019-03-18 – 2019-03-19 (×2): 250 mL via INTRAVENOUS

## 2019-03-18 MED ORDER — ZIPRASIDONE MESYLATE 20 MG IM SOLR
INTRAMUSCULAR | Status: AC
  Filled 2019-03-18: qty 20

## 2019-03-18 MED ORDER — POLYETHYLENE GLYCOL 3350 17 G PO PACK: 17.0000 g | PACK | Freq: Every day | ORAL | Status: DC | PRN

## 2019-03-18 MED ORDER — ONDANSETRON HCL 4 MG/2ML IJ SOLN: 4.0000 mg | Freq: Four times a day (QID) | INTRAMUSCULAR | Status: DC | PRN

## 2019-03-18 MED ORDER — SODIUM CHLORIDE 0.9 % IV SOLN
500.0000 mg | INTRAVENOUS | Status: DC
  Administered 2019-03-19 – 2019-03-20 (×2): 500 mg via INTRAVENOUS
  Filled 2019-03-18 (×2): qty 500

## 2019-03-18 MED ORDER — DOCUSATE SODIUM 100 MG PO CAPS
100.0000 mg | ORAL_CAPSULE | Freq: Two times a day (BID) | ORAL | Status: DC
  Administered 2019-03-19 – 2019-03-22 (×5): 100 mg via ORAL
  Filled 2019-03-18 (×6): qty 1

## 2019-03-18 NOTE — ED Triage Notes (Signed)
Pt BIBA from home, was found unresponsive on floor- EMS reports pt was non responsive, apneic, got 0.5 mg Narcan, 500 mL NaCl.     Friend on scene reports hx of heroine use.

## 2019-03-18 NOTE — Progress Notes (Signed)
eLink Physician-Brief Progress Note Patient Name: Mark Strickland DOB: 11-12-1991 MRN: [id number]   Date of Service  03/18/2019  HPI/Events of Note  Patient more alert and cooperative. Patient on PO medications. Request to give medications with sips.   eICU Interventions  May give medications with sips.      Intervention Category Major Interventions: Other:  Lenell Antu 03/18/2019, 9:13 PM

## 2019-03-18 NOTE — ED Notes (Signed)
Bed: PH15 Expected date:  Expected time:  Means of arrival:  Comments: EMS-OD-narcan given/fever

## 2019-03-18 NOTE — ED Notes (Signed)
This RN entered room after hearing pt screaming at doorway.  Pt was standing at sink, had removed IV, all cardiac monitoring, O2 monitoring.  Pt refusing to allow staff to replace NRB, O2 montoring, cardiac monitoring.  Pt screaming at staff, "you've been torturing me, you havent been in here in 20 minutes, this is your fault."  This RN tried to redirect pt to be able to reapply monitoring.  Pt shouting, stating that he was leaving, he was going to sue this hospital.  This RN and another Rn again attempting to redirect pt, pt continuid to shout at staff, will not listen to anything RN is trying to say.  RN informed pt that admitting provider would be notified of his desire to leave. Night shift provider paged.

## 2019-03-18 NOTE — Consult Note (Signed)
ER Consult Note   Mark Strickland ZOX:[id number] DOB: 08/08/1991 DOA: 03/18/2019  PCP: No primary care provider on file.  Chief Complaint: overdose  HPI: Mark Strickland is a 28 y.o. male with no known medical history presenting with an overdose.  He is unable to provide significant history - reports "I guess" he used heroin, injects in his feet.  He was placed on 2L [place] upon arrival, increased to 6L with persistent hypoxia into the 70s and so was placed on a NRB.  By the time I saw him , he had normal O2 sats on NRB but given suspicion for COVID and acute respiratory failure, PCCM was consulted and has agreed to admit the patient.   ED Course:   Heroin overdose, better with Narcan.  Also with fevers at home, bilateral infiltrates on CXR, hypoxic on 4L [place] O2 with sats 91-92%.  Rocephin and Azithromycin coverage for CAP and aspiration.  Review of Systems: unable to effectively perform  Ambulatory Status:  Ambulates without assistance  Past Medical History:  Diagnosis Date   Heroin abuse (HCC)     History reviewed. No pertinent surgical history.  Social History   Socioeconomic History   Marital status: Not on file    Spouse name: Not on file   Number of children: Not on file   Years of education: Not on file   Highest education level: Not on file  Occupational History   Not on file  Social Needs   Financial resource strain: Not on file   Food insecurity:    Worry: Not on file    Inability: Not on file   Transportation needs:    Medical: Not on file    Non-medical: Not on file  Tobacco Use   Smoking status: Not on file  Substance and Sexual Activity   Alcohol use: Not on file   Drug use: Not on file   Sexual activity: Not on file  Lifestyle   Physical activity:    Days per week: Not on file    Minutes per session: Not on file   Stress: Not on file  Relationships   Social connections:    Talks on phone: Not on file    Gets together: Not on file   Attends religious service: Not on file    Active member of club or organization: Not on file    Attends meetings of clubs or organizations: Not on file    Relationship status: Not on file   Intimate partner violence:    Fear of current or ex partner: Not on file    Emotionally abused: Not on file    Physically abused: Not on file    Forced sexual activity: Not on file  Other Topics Concern   Not on file  Social History Narrative   Not on file    Not on File  History reviewed. No pertinent family history.  Prior to Admission medications   Not on File    Physical Exam: Vitals:   03/18/19 1605 03/18/19 1607 03/18/19 1615 03/18/19 1630  BP: (!) 163/123  132/78 125/77  Pulse: (!) 110 (!) 102 95 94  Resp: Temp:      TempSrc:      SpO2: 95% (!) 88% 99% 100%  Weight:      Height:          General:  Patient was somnolent but conversant with NRB at the time of admission  Eyes:   EOMI,  normal lids, iris  ENT:  grossly normal hearing, lips & tongue  Neck:  no LAD, masses or thyromegaly  Cardiovascular:  RR with mild tachycardia, no m/r/g. No LE edema.   Respiratory:  Diffuse rhonchorous breath sounds throughout  Mildly increased respiratory effort on NRB.  Abdomen:  soft, NT, ND, NABS  Skin:  no rash or induration seen on limited exam; mild diffuse small track marks on UEs, obscured to some extent by tattoos and less so on B feet  Musculoskeletal:  grossly normal tone BUE/BLE, good ROM, no bony abnormality  Lower extremity:  No LE edema.  Limited foot exam with no ulcerations.  2+ distal pulses.  Psychiatric:  somnolent mood and affect, speech fluent and appropriate, AOx3 but lapses back to sleep  Neurologic:  Unable to perform    Radiological Exams on Admission: Dg Chest Port 1 View  Result Date: 03/18/2019 CLINICAL DATA:  28 year old male with fever, found unresponsive. History of heroin use. EXAM: PORTABLE CHEST 1 VIEW COMPARISON:  None.  FINDINGS: Low inspiratory volumes. Patchy airspace opacities noted in the right lower and mid lung. To a lesser extent, subtle patchy airspace opacities are also present peripherally in the left lung base. Cardiac and mediastinal contours are within normal limits. No evidence of pneumothorax. No acute osseous abnormality. IMPRESSION: Right greater than left patchy airspace opacities in a predominantly dependent distribution. Given the clinical history, aspiration pneumonitis is favored. However, viral pneumonia could have a similar appearance and distribution. Electronically Signed   By: Malachy Moan M.D.   On: 03/18/2019 14:46    EKG: Independently reviewed.  NSR with rate 86; no evidence of acute ischemia   Labs on Admission: I have personally reviewed the available labs and imaging studies at the time of the admission.  Pertinent labs:   Glucose 116 WBC 8.7 Hgb 10.4 APAP <10 ETOH <10  Assessment/Plan Active Problems:   Acute respiratory failure with hypoxia (HCC)   Drug overdose   Acute respiratory failure with hypoxia -Patient found unresponsive and responded to Narcan x 2 -He acknowledged injecting in his feet to me -Upon further discussion with ER physician, he reported 2 days of subjective fever and cough with rhinorrhea -He initially required 2L [place] O2 and then became hypoxic and this was increased to 6L, but he remained hypoxic and this was changed to NRB -Due to escalating O2 requirement in the setting of possible acute respiratory illness, care was transferred to [facility] and he will be admitted to the ICU under their service -[facility] will be happy to accept the patient back in transfer once his critical illness has improved -Pertinent labs concerning for COVID include normal WBC count; increased BUN -CXR with multifocal opacities which may be c/w COVID vs. Multifocal PNA; CT chest was not performed -Patient was seen wearing full PPE including: gown, gloves, head cover, N95, and  face shield; donning and doffing was observed to be in compliance with current standards. -Patient log was signed and witnessed -Will admit for ongoing supportive care  Heroin abuse -Limited information available -Patient does acknowledge heroin use -Likely to need assistance from SW once recovered from acute illness, as above -Narcan prn although suspect that most of his respiratory depression is associated with viral illness, concerning for COVID -COWS protocol with prn q2h morphine or as per PCCM   DVT prophylaxis:  Lovenox  Code Status:  Full  Family Communication: None present Disposition Plan:  Home once clinically improved Consults called: PCCM  Admission status: Admit -  It is my clinical opinion that admission to INPATIENT is reasonable and necessary because of the expectation that this patient will require hospital care that crosses at least 2 midnights to treat this condition based on the medical complexity of the problems presented.  Given the aforementioned information, the predictability of an adverse outcome is felt to be significant.     Jonah Blue MD [facility]   How to contact the [facility] Attending or Consulting provider 7A - 7P or covering provider during after hours 7P -7A, for this patient?  1. Check the care team in [facility] and look for a) attending/consulting [facility] provider listed and b) the [facility] team listed 2. Log into [email or web address] and use [place] universal password to access. If you do not have the password, please contact the hospital operator. 3. Locate the [facility] provider you are looking for under [facility] and page to a number that you can be directly reached. 4. If you still have difficulty reaching the provider, please page the [facility] (Director on Call) for the Hospitalists listed on amion for assistance.   03/18/2019, 5:11 PM

## 2019-03-18 NOTE — H&P (Signed)
NAME:  Mark Strickland, MRN:  [id number], DOB:  30-Dec-1990, LOS: 0 ADMISSION DATE:  03/18/2019, CONSULTATION DATE:  3/302020 REFERRING MD:  EDP, CHIEF COMPLAINT: Overdose, respiratory failure  Brief History   28 year old with substance abuse presenting with resume drug overdose.  Patient became unresponsive while talking with a friend over phone.  Given 1 round of Narcan by first responders with some improvement Given another round of Narcan in ED.  Continues to be somnolent.  Reports fever, dyspnea at home.  Cannot elicit any history of travel or contacts. Hospitalist called initially however he had rapid progressive increase in oxygen requirements.  No nonrebreather Hence pulmonary was consulted.  Past Medical History  None  Significant Hospital Events     Consults:    Procedures:    Significant Diagnostic Tests:  Chest x-ray 3/30-bilateral pulmonary infiltrates right greater than left.  I have reviewed the images personally.  Micro Data:    Antimicrobials:  Ceftriaxone Azithromycin  Interim history/subjective:    Objective   Blood pressure 125/77, pulse 94, temperature 99.1 F (37.3 C), temperature source Oral, resp. rate 17, height 5\' 11"  (1.803 m), weight 79.4 kg, SpO2 100 %.       No intake or output data in the 24 hours ending 03/18/19 1709 Filed Weights   03/18/19 1356  Weight: 79.4 kg    Examination: Blood pressure 125/77, pulse 94, temperature 99.1 F (37.3 C), temperature source Oral, resp. rate 17, height 5\' 11"  (1.803 m), weight 79.4 kg, SpO2 100 %. Gen:      No acute distress HEENT:  EOMI, sclera anicteric Neck:     No masses; no thyromegaly Lungs:    Clear to auscultation bilaterally; normal respiratory effort CV:         Regular rate and rhythm; no murmurs Abd:      + bowel sounds; soft, non-tender; no palpable masses, no distension Ext:    No edema; adequate peripheral perfusion Skin:      Warm and dry; no rash Neuro: Somnolent, arousable.   Resolved Hospital Problem list     Assessment & Plan:  28 year old with acute hypoxic respiratory failure with bilateral pulmonary infiltrates Could be aspiration pneumonia in the setting of drug overdose.  Will need to be ruled out for COVID-19  Respiratory failure Admit to ICU. He is at high risk of needing intubation given rapid deterioration however has stabilized for now Continue close monitoring Ceftriaxone, azithromycin Keep n.p.o.  Drug overdose Follow urine tox panel Monitor for withdrawal symptoms.   Best practice:  Diet: N.p.o. Pain/Anxiety/Delirium protocol (if indicated): NA VAP protocol (if indicated): NA DVT prophylaxis: Lovenox GI prophylaxis: NA Glucose control: Monitor blood sugars on metabolic panel. Mobility: Bed Code Status: Full Family Communication: No family information in chart Disposition: ICU  Labs   CBC: Recent Labs  Lab 03/18/19 1406  WBC 8.7  NEUTROABS 6.3  HGB 10.4*  HCT 33.2*  MCV 94.9  PLT 231    Basic Metabolic Panel: Recent Labs  Lab 03/18/19 1406  NA 140  K 3.4*  CL 104  CO2 28  GLUCOSE 116*  BUN 27*  CREATININE 1.09  CALCIUM 8.1*   GFR: Estimated Creatinine Clearance: 106.5 mL/min (by C-G formula based on SCr of 1.09 mg/dL). Recent Labs  Lab 03/18/19 1406  WBC 8.7    Liver Function Tests: No results for input(s): AST, ALT, ALKPHOS, BILITOT, PROT, ALBUMIN in the last 168 hours. No results for input(s): LIPASE, AMYLASE in the last 168 hours. No  results for input(s): AMMONIA in the last 168 hours.  ABG No results found for: PHART, PCO2ART, PO2ART, HCO3, TCO2, ACIDBASEDEF, O2SAT   Coagulation Profile: No results for input(s): INR, PROTIME in the last 168 hours.  Cardiac Enzymes: No results for input(s): CKTOTAL, CKMB, CKMBINDEX, TROPONINI in the last 168 hours.  HbA1C: No results found for: HGBA1C  CBG: Recent Labs  Lab 03/18/19 1402  GLUCAP 107*    Review of Systems:   Unable to obtain   Past Medical History  He,  has no past medical history on file.    Social History      Family History   His family history is not on file.   Allergies Not on File   Home Medications  Prior to Admission medications   Not on File    The patient is critically ill with multiple organ system failure and requires high complexity decision making for assessment and support, frequent evaluation and titration of therapies, advanced monitoring, review of radiographic studies and interpretation of complex data.   Critical Care Time devoted to patient care services, exclusive of separately billable procedures, described in this note is 35 minutes.   Chilton Greathouse MD [facility] Pager [phone] If no answer call [phone] 03/18/2019, 5:18 PM

## 2019-03-18 NOTE — ED Notes (Signed)
Attempted report x 2 

## 2019-03-18 NOTE — ED Notes (Signed)
Per Consulting civil engineer, floor nurse requesting to wait for change of shift to get report on pt.

## 2019-03-18 NOTE — ED Notes (Signed)
Pt cooperative, making confused statements.  This RN informed pt that he would have to have another phlebotomy stick for blood collection, pt started referring to being bitten by dogs, "the dogs did that, I know they chew on me."  Pt easily redirected, able to follow commands.  Will continue to monitor.

## 2019-03-18 NOTE — ED Provider Notes (Signed)
[facility]-EMERGENCY DEPT Provider Note   CSN: [id number] Arrival date & time: 03/18/19  1348    History   Chief Complaint Chief Complaint  Patient presents with   Drug Overdose    HPI Mark Strickland is a 28 y.o. male.     Patient is a 28 year old male who presents after drug overdose.  He has a history of heroin abuse and states that he thinks he snorted some heroin earlier today.  EMS found him unresponsive and he was given Narcan.  He did wake up and became responsive following the Narcan.  He was noted to have slightly elevated temperature at 99 per EMS.  Patient states he has not felt well over the last 2 days with subjective fevers, coughing and runny nose.  He denies any vomiting or diarrhea.  He denies any suicidal ideations.  He denies any alcohol use or other drug use.     No past medical history on file.  There are no active problems to display for this patient.    The histories are not reviewed yet. Please review them in the "History" navigator section and refresh this SmartLink.      Home Medications    Prior to Admission medications   Not on File    Family History No family history on file.  Social History Social History   Tobacco Use   Smoking status: Not on file  Substance Use Topics   Alcohol use: Not on file   Drug use: Not on file     Allergies   Patient has no allergy information on record.   Review of Systems Review of Systems  Constitutional: Positive for fatigue and fever. Negative for chills and diaphoresis.  HENT: Positive for congestion and rhinorrhea. Negative for sneezing.   Eyes: Negative.   Respiratory: Positive for cough. Negative for chest tightness and shortness of breath.   Cardiovascular: Negative for chest pain and leg swelling.  Gastrointestinal: Negative for abdominal pain, blood in stool, diarrhea, nausea and vomiting.  Genitourinary: Negative for difficulty urinating, flank pain,  frequency and hematuria.  Musculoskeletal: Positive for myalgias. Negative for arthralgias and back pain.  Skin: Negative for rash.  Neurological: Negative for dizziness, speech difficulty, weakness, numbness and headaches.     Physical Exam Updated Vital Signs BP 115/79    Pulse 83    Temp 99.1 F (37.3 C) (Oral)    Resp 18    Ht 5\' 11"  (1.803 m)    Wt 79.4 kg    SpO2 93%    BMI 24.41 kg/m   Physical Exam Constitutional:      Appearance: He is well-developed.  HENT:     Head: Normocephalic and atraumatic.  Eyes:     Pupils: Pupils are equal, round, and reactive to light.     Comments: Pupils are pinpoint bilaterally  Neck:     Musculoskeletal: Normal range of motion and neck supple.  Cardiovascular:     Rate and Rhythm: Normal rate and regular rhythm.     Heart sounds: Normal heart sounds.  Pulmonary:     Effort: Pulmonary effort is normal. No respiratory distress.     Breath sounds: Normal breath sounds. No wheezing or rales.  Chest:     Chest wall: No tenderness.  Abdominal:     General: Bowel sounds are normal.     Palpations: Abdomen is soft.     Tenderness: There is no abdominal tenderness. There is no guarding or rebound.  Musculoskeletal:  Normal range of motion.  Lymphadenopathy:     Cervical: No cervical adenopathy.  Skin:    General: Skin is warm and dry.     Findings: No rash.  Neurological:     General: No focal deficit present.     Mental Status: He is alert and oriented to person, place, and time.      ED Treatments / Results  Labs (all labs ordered are listed, but only abnormal results are displayed) Labs Reviewed  BASIC METABOLIC PANEL - Abnormal; Notable for the following components:      Result Value   Potassium 3.4 (*)    Glucose, Bld 116 (*)    BUN 27 (*)    Calcium 8.1 (*)    All other components within normal limits  CBC WITH DIFFERENTIAL/PLATELET - Abnormal; Notable for the following components:   RBC 3.50 (*)    Hemoglobin 10.4 (*)     HCT 33.2 (*)    All other components within normal limits  ACETAMINOPHEN LEVEL - Abnormal; Notable for the following components:   Acetaminophen (Tylenol), Serum <10 (*)    All other components within normal limits  CBG MONITORING, ED - Abnormal; Notable for the following components:   Glucose-Capillary 107 (*)    All other components within normal limits  ETHANOL  RAPID URINE DRUG SCREEN, HOSP PERFORMED    EKG EKG Interpretation  Date/Time:  Monday March 18 2019 13:58:08 EDT Ventricular Rate:  86 PR Interval:    QRS Duration: 104 QT Interval:  385 QTC Calculation: 461 R Axis:   69 Text Interpretation:  Sinus rhythm No old tracing to compare Confirmed by Rolan Bucco [id number]) on 03/18/2019 2:36:58 PM   Radiology Dg Chest Port 1 View  Result Date: 03/18/2019 CLINICAL DATA:  28 year old male with fever, found unresponsive. History of heroin use. EXAM: PORTABLE CHEST 1 VIEW COMPARISON:  None. FINDINGS: Low inspiratory volumes. Patchy airspace opacities noted in the right lower and mid lung. To a lesser extent, subtle patchy airspace opacities are also present peripherally in the left lung base. Cardiac and mediastinal contours are within normal limits. No evidence of pneumothorax. No acute osseous abnormality. IMPRESSION: Right greater than left patchy airspace opacities in a predominantly dependent distribution. Given the clinical history, aspiration pneumonitis is favored. However, viral pneumonia could have a similar appearance and distribution. Electronically Signed   By: Malachy Moan M.D.   On: 03/18/2019 14:46    Procedures Procedures (including critical care time)  Medications Ordered in ED Medications  cefTRIAXone (ROCEPHIN) 1 g in sodium chloride 0.9 % 100 mL IVPB (has no administration in time range)  azithromycin (ZITHROMAX) 500 mg in sodium chloride 0.9 % 250 mL IVPB (has no administration in time range)  naloxone [facility]) injection 0.4 mg (0.4 mg Intravenous  Given 03/18/19 1411)  sodium chloride 0.9 % bolus 1,000 mL (1,000 mLs Intravenous New Bag/Given 03/18/19 1413)     Initial Impression / Assessment and Plan / ED Course  I have reviewed the triage vital signs and the nursing notes.  Pertinent labs & imaging results that were available during my care of the patient were reviewed by me and considered in my medical decision making (see chart for details).        Patient is a 28 year old male who presents after heroin overdose.  He was fairly sleepy on arrival with pinpoint pupils and was given a second dose of Narcan.  He is currently alert and oriented.  He has no hypo-ventilation but  is hypoxic.  He is requiring nasal cannula oxygen at 4 L/min to maintain oxygen saturations at 92%.  Chest x-ray shows some bilateral infiltrates.  He was started on Rocephin and Zithromax.  His other labs are non-concerning.  His Tylenol level is negative.  His alcohol level is negative.  I spoke with Dr. Ophelia Charter who will admit the patient for further treatment.  Quinntin Malter was evaluated in Emergency Department on 03/18/2019 for the symptoms described in the history of present illness. He was evaluated in the context of the global COVID-19 pandemic, which necessitated consideration that the patient might be at risk for infection with the SARS-CoV-2 virus that causes COVID-19. Institutional protocols and algorithms that pertain to the evaluation of patients at risk for COVID-19 are in a state of rapid change based on information released by regulatory bodies including the CDC and federal and state organizations. These policies and algorithms were followed during the patient's care in the ED.   Final Clinical Impressions(s) / ED Diagnoses   Final diagnoses:  Accidental overdose of heroin, initial encounter [facility])  Community acquired pneumonia, unspecified laterality    ED Discharge Orders    None       Rolan Bucco, MD 03/18/19 1537

## 2019-03-18 NOTE — Progress Notes (Signed)
eLink Physician-Brief Progress Note Patient Name: Mark Strickland DOB: 1991/03/26 MRN: [id number]   Date of Service  03/18/2019  HPI/Events of Note  Delirium - Patient yelling and shouting at staff and has pulled out IV access. QTc interval = 0.461 seconds.   eICU Interventions  Will order: 1. Geodon 10 mg IM now.  2. ICU ETOH CIWA protocol.     Intervention Category Major Interventions: Delirium, psychosis, severe agitation - evaluation and management  Mackinsey Pelland Eugene 03/18/2019, 7:25 PM

## 2019-03-18 NOTE — ED Notes (Signed)
Pt pulling off ekg leads, NRB O2 mask, and attempting to get oob pulled out iv call for order geodon 10 mg im

## 2019-03-18 NOTE — ED Notes (Addendum)
MD Arsenio Loader, night shift provider, made aware of pts current condition.

## 2019-03-18 NOTE — ED Notes (Signed)
Attempted report x1. 

## 2019-03-18 NOTE — ED Notes (Signed)
ED TO INPATIENT HANDOFF REPORT  ED Nurse Name and Phone #: Reita Cliche [phone]   S Name/Age/Gender Mark Strickland 28 y.o. male Room/Bed: WA15/WA15  Code Status   Code Status: Full Code  Home/SNF/Other Home Patient oriented to: self Is this baseline? No   Triage Complete: Triage complete  Chief Complaint overdose;fever  Triage Note Pt BIBA from home, was found unresponsive on floor- EMS reports pt was non responsive, apneic, got 0.5 mg Narcan, 500 mL NaCl.     Friend on scene reports hx of heroine use.     Allergies Not on File  Level of Care/Admitting Diagnosis ED Disposition    ED Disposition Condition Comment   Admit  Hospital Area: [facility] [[id number]]  Level of Care: ICU [6]  Diagnosis: Drug overdose [202575]  Admitting Physician: Chilton Greathouse [[id number]]  Attending Physician: Chilton Greathouse [[id number]]  Estimated length of stay: past midnight tomorrow  Certification:: I certify this patient will need inpatient services for at least 2 midnights  PT Class (Do Not Modify): Inpatient [101]  PT Acc Code (Do Not Modify): Private [1]       B Medical/Surgery History Past Medical History:  Diagnosis Date  . Heroin abuse (HCC)    History reviewed. No pertinent surgical history.   A IV Location/Drains/Wounds Patient Lines/Drains/Airways Status   Active Line/Drains/Airways    Name:   Placement date:   Placement time:   Site:   Days:   Peripheral IV 03/18/19 Left;Upper Arm   03/18/19    -    Arm   less than 1          Intake/Output Last 24 hours  Intake/Output Summary (Last 24 hours) at 03/18/2019 1954 Last data filed at 03/18/2019 1747 Gross per 24 hour  Intake 1413 ml  Output -  Net 1413 ml    Labs/Imaging Results for orders placed or performed during the hospital encounter of 03/18/19 (from the past 48 hour(s))  CBG monitoring, ED     Status: Abnormal   Collection Time: 03/18/19  2:02 PM  Result Value Ref Range    Glucose-Capillary 107 (H) 70 - 99 mg/dL  Basic metabolic panel     Status: Abnormal   Collection Time: 03/18/19  2:06 PM  Result Value Ref Range   Sodium 140 135 - 145 mmol/L   Potassium 3.4 (L) 3.5 - 5.1 mmol/L   Chloride 104 98 - 111 mmol/L   CO2 28 22 - 32 mmol/L   Glucose, Bld 116 (H) 70 - 99 mg/dL   BUN 27 (H) 6 - 20 mg/dL   Creatinine, Ser 5.36 0.61 - 1.24 mg/dL   Calcium 8.1 (L) 8.9 - 10.3 mg/dL   GFR calc non Af Amer >60 >60 mL/min   GFR calc Af Amer >60 >60 mL/min   Anion gap 8 5 - 15    Comment: Performed at [facility], [address], [place], [place] [address]  CBC with Differential     Status: Abnormal   Collection Time: 03/18/19  2:06 PM  Result Value Ref Range   WBC 8.7 4.0 - 10.5 K/uL   RBC 3.50 (L) 4.22 - 5.81 MIL/uL   Hemoglobin 10.4 (L) 13.0 - 17.0 g/dL   HCT 47.4 (L) 25.9 - 56.3 %   MCV 94.9 80.0 - 100.0 fL   MCH 29.7 26.0 - 34.0 pg   MCHC 31.3 30.0 - 36.0 g/dL   RDW 87.5 64.3 - 32.9 %   Platelets 231 150 -  400 K/uL   nRBC 0.0 0.0 - 0.2 %   Neutrophils Relative % 72 %   Neutro Abs 6.3 1.7 - 7.7 K/uL   Lymphocytes Relative 16 %   Lymphs Abs 1.4 0.7 - 4.0 K/uL   Monocytes Relative 11 %   Monocytes Absolute 0.9 0.1 - 1.0 K/uL   Eosinophils Relative 0 %   Eosinophils Absolute 0.0 0.0 - 0.5 K/uL   Basophils Relative 0 %   Basophils Absolute 0.0 0.0 - 0.1 K/uL   Immature Granulocytes 1 %   Abs Immature Granulocytes 0.04 0.00 - 0.07 K/uL    Comment: Performed at [facility], [address] [place], [place] [address]  Acetaminophen level     Status: Abnormal   Collection Time: 03/18/19  2:06 PM  Result Value Ref Range   Acetaminophen (Tylenol), Serum <10 (L) 10 - 30 ug/mL    Comment: (NOTE) Therapeutic concentrations vary significantly. A range of 10-30 ug/mL  may be an effective concentration for many patients. However, some  are best treated at concentrations outside of this range. Acetaminophen concentrations  >150 ug/mL at 4 hours after ingestion  and >50 ug/mL at 12 hours after ingestion are often associated with  toxic reactions. Performed at [facility], [address] [place], [place] [address]   Ethanol     Status: None   Collection Time: 03/18/19  2:06 PM  Result Value Ref Range   Alcohol, Ethyl (B) <10 <10 mg/dL    Comment: (NOTE) Lowest detectable limit for serum alcohol is 10 mg/dL. For medical purposes only. Performed at [facility], [address] [place], [place] [address]   Procalcitonin     Status: None   Collection Time: 03/18/19  4:44 PM  Result Value Ref Range   Procalcitonin 3.27 ng/mL    Comment:        Interpretation: PCT > 2 ng/mL: Systemic infection (sepsis) is likely, unless other causes are known. (NOTE)       Sepsis PCT Algorithm           Lower Respiratory Tract                                      Infection PCT Algorithm    ----------------------------     ----------------------------         PCT < 0.25 ng/mL                PCT < 0.10 ng/mL         Strongly encourage             Strongly discourage   discontinuation of antibiotics    initiation of antibiotics    ----------------------------     -----------------------------       PCT 0.25 - 0.50 ng/mL            PCT 0.10 - 0.25 ng/mL               OR       >80% decrease in PCT            Discourage initiation of                                            antibiotics  Encourage discontinuation           of antibiotics    ----------------------------     -----------------------------         PCT >= 0.50 ng/mL              PCT 0.26 - 0.50 ng/mL               AND       <80% decrease in PCT              Encourage initiation of                                             antibiotics       Encourage continuation           of antibiotics    ----------------------------     -----------------------------        PCT >= 0.50 ng/mL                  PCT > 0.50  ng/mL               AND         increase in PCT                  Strongly encourage                                      initiation of antibiotics    Strongly encourage escalation           of antibiotics                                     -----------------------------                                           PCT <= 0.25 ng/mL                                                 OR                                        > 80% decrease in PCT                                     Discontinue / Do not initiate                                             antibiotics Performed at [facility], [address], [place], [place] [address]   Ferritin     Status: None   Collection Time: 03/18/19  4:44 PM  Result Value Ref  Range   Ferritin 135 24 - 336 ng/mL    Comment: Performed at [facility], [address] [place], [place] [address]  D-dimer, quantitative (not at [facility])     Status: Abnormal   Collection Time: 03/18/19  4:44 PM  Result Value Ref Range   D-Dimer, Quant 1.09 (H) 0.00 - 0.50 ug/mL-FEU    Comment: (NOTE) At the manufacturer cut-off of 0.50 ug/mL FEU, this assay has been documented to exclude PE with a sensitivity and negative predictive value of 97 to 99%.  At this time, this assay has not been approved by the FDA to exclude DVT/VTE. Results should be correlated with clinical presentation. Performed at [facility], [address] [place], [place] [address]   C-reactive protein     Status: Abnormal   Collection Time: 03/18/19  4:44 PM  Result Value Ref Range   CRP 22.9 (H) <1.0 mg/dL    Comment: Performed at [facility], [address], [place], [place] [address]  Lactate dehydrogenase     Status: Abnormal   Collection Time: 03/18/19  4:44 PM  Result Value Ref Range   LDH 201 (H) 98 - 192 U/L    Comment: Performed at [facility], [address] [place], [place] [address]   Influenza panel by PCR (type A & B)     Status: None   Collection Time: 03/18/19  5:30 PM  Result Value Ref Range   Influenza A By PCR NEGATIVE NEGATIVE   Influenza B By PCR NEGATIVE NEGATIVE    Comment: (NOTE) The Xpert Xpress Flu assay is intended as an aid in the diagnosis of  influenza and should not be used as a sole basis for treatment.  This  assay is FDA approved for nasopharyngeal swab specimens only. Nasal  washings and aspirates are unacceptable for Xpert Xpress Flu testing. Performed at [facility], [address] [place], [place] [address]   Urine rapid drug screen (hosp performed)     Status: Abnormal   Collection Time: 03/18/19  6:29 PM  Result Value Ref Range   Opiates POSITIVE (A) NONE DETECTED   Cocaine POSITIVE (A) NONE DETECTED   Benzodiazepines POSITIVE (A) NONE DETECTED   Amphetamines NONE DETECTED NONE DETECTED   Tetrahydrocannabinol NONE DETECTED NONE DETECTED   Barbiturates NONE DETECTED NONE DETECTED    Comment: (NOTE) DRUG SCREEN FOR MEDICAL PURPOSES ONLY.  IF CONFIRMATION IS NEEDED FOR ANY PURPOSE, NOTIFY LAB WITHIN 5 DAYS. LOWEST DETECTABLE LIMITS FOR URINE DRUG SCREEN Drug Class                     Cutoff (ng/mL) Amphetamine and metabolites    1000 Barbiturate and metabolites    200 Benzodiazepine                 200 Tricyclics and metabolites     300 Opiates and metabolites        300 Cocaine and metabolites        300 THC                            50 Performed at [facility], [address], [place], [place] [address]    Dg Chest Port 1 View  Result Date: 03/18/2019 CLINICAL DATA:  28 year old male with fever, found unresponsive. History of heroin use. EXAM: PORTABLE CHEST 1 VIEW COMPARISON:  None. FINDINGS:  Low inspiratory volumes. Patchy airspace opacities noted in the right lower and mid lung. To a lesser extent, subtle patchy airspace opacities are also present peripherally in the left lung  base. Cardiac and mediastinal contours are within normal limits. No evidence of pneumothorax. No acute osseous abnormality. IMPRESSION: Right greater than left patchy airspace opacities in a predominantly dependent distribution. Given the clinical history, aspiration pneumonitis is favored. However, viral pneumonia could have a similar appearance and distribution. Electronically Signed   By: Malachy Moan M.D.   On: 03/18/2019 14:46    Pending Labs Unresulted Labs (From admission, onward)    Start     Ordered   03/25/19 0500  Creatinine, serum  (enoxaparin (LOVENOX)    CrCl >/= 30 ml/min)  Weekly,   R    Comments:  while on enoxaparin therapy    03/18/19 1708   03/19/19 0500  CBC with Differential/Platelet  Daily,   R     03/18/19 1645   03/19/19 0500  Comprehensive metabolic panel  Daily,   R     03/18/19 1645   03/19/19 0500  CK  Daily,   R     03/18/19 1708   03/19/19 0500  Magnesium  Daily,   R     03/18/19 1708   03/18/19 1835  Interleukin-6, Plasma  Once,   R     03/18/19 1835   03/18/19 1705  Culture, blood (Routine X 2) w Reflex to ID Panel  BLOOD CULTURE X 2,   R    Question:  Patient immune status  Answer:  Normal   03/18/19 1708   03/18/19 1705  Legionella Pneumophila Serogp 1 Ur Ag  Once,   R     03/18/19 1708   03/18/19 1705  Strep pneumoniae urinary antigen  Once,   R     03/18/19 1708   03/18/19 1704  Respiratory Panel by PCR  Add-on,   R    Question:  Patient immune status  Answer:  Normal   03/18/19 1708   03/18/19 1642  Novel Coronavirus, NAA (hospital order; send-out to ref lab)  (CH[facility] IP COVID ADMIT NOVEL CORONAVIRUS, NAA (HOSPITAL ORDER; SEND-OUT TO REF LAB))  Once,   R    Question Answer Comment  Current symptoms Fever and Shortness of breath   Excluded other viral illnesses No (testing not indicated)   Patient immune status Normal      03/18/19 1645          Vitals/Pain Today's Vitals   03/18/19 1715 03/18/19 1800 03/18/19 1815 03/18/19 1830  BP:  139/87 138/74    Pulse: (!) 102 (!) 110 (!) 115 (!) 106  Resp: 17 14 20  (!) 24  Temp:      TempSrc:      SpO2: 100% 97% 98% 96%  Weight:      Height:      PainSc:        Isolation Precautions Airborne and Contact precautions  Medications Medications  0.9 %  sodium chloride infusion (500 mLs Intravenous New Bag/Given 03/18/19 1614)  naloxone (NARCAN) injection 0.4 mg (has no administration in time range)  enoxaparin (LOVENOX) injection 40 mg (has no administration in time range)  acetaminophen (TYLENOL) tablet 650 mg (has no administration in time range)  docusate sodium (COLACE) capsule 100 mg (has no administration in time range)  polyethylene glycol (MIRALAX / GLYCOLAX) packet 17 g (has no administration in time range)  bisacodyl (DULCOLAX) EC tablet 5 mg (has no  administration in time range)  ondansetron (ZOFRAN) tablet 4 mg (has no administration in time range)    Or  ondansetron (ZOFRAN) injection 4 mg (has no administration in time range)  morphine 2 MG/ML injection 2 mg (has no administration in time range)  cefTRIAXone (ROCEPHIN) 1 g in sodium chloride 0.9 % 100 mL IVPB (has no administration in time range)  azithromycin (ZITHROMAX) 500 mg in sodium chloride 0.9 % 250 mL IVPB (has no administration in time range)  ziprasidone (GEODON) injection 10 mg (has no administration in time range)  dexmedetomidine (PRECEDEX) 200 MCG/50ML (4 mcg/mL) infusion (has no administration in time range)  midazolam (VERSED) injection 1-2 mg (has no administration in time range)  folic acid (FOLVITE) tablet 1 mg (has no administration in time range)  thiamine (VITAMIN B-1) tablet 100 mg (has no administration in time range)  multivitamin with minerals tablet 1 tablet (has no administration in time range)  ziprasidone (GEODON) 20 MG injection (has no administration in time range)  sterile water (preservative free) injection (has no administration in time range)  naloxone [facility]) injection 0.4  mg (0.4 mg Intravenous Given 03/18/19 1411)  sodium chloride 0.9 % bolus 1,000 mL (0 mLs Intravenous Stopped 03/18/19 1747)  cefTRIAXone (ROCEPHIN) 1 g in sodium chloride 0.9 % 100 mL IVPB (1 g Intravenous New Bag/Given 03/18/19 1615)  azithromycin (ZITHROMAX) 500 mg in sodium chloride 0.9 % 250 mL IVPB (500 mg Intravenous New Bag/Given 03/18/19 1745)    Mobility walks High fall risk   Focused Assessments Neuro Assessment Handoff:  Swallow screen pass? NA         Neuro Assessment:   Neuro Checks:      Last Documented NIHSS Modified Score:   Has TPA been given? No If patient is a Neuro Trauma and patient is going to OR before floor call report to 4N Charge nurse: [phone] or [phone]     R Recommendations: See Admitting Provider Note  Report given to:   Additional Notes: pt given geodon and IV access being obtained now

## 2019-03-19 ENCOUNTER — Encounter (HOSPITAL_COMMUNITY): Payer: Self-pay

## 2019-03-19 DIAGNOSIS — T50901A Poisoning by unspecified drugs, medicaments and biological substances, accidental (unintentional), initial encounter: Secondary | ICD-10-CM

## 2019-03-19 LAB — COMPREHENSIVE METABOLIC PANEL
ALT: 14 U/L (ref 0–44)
AST: 22 U/L (ref 15–41)
Albumin: 2.9 g/dL — ABNORMAL LOW (ref 3.5–5.0)
Alkaline Phosphatase: 36 U/L — ABNORMAL LOW (ref 38–126)
Anion gap: 7 (ref 5–15)
BUN: 19 mg/dL (ref 6–20)
CO2: 25 mmol/L (ref 22–32)
Calcium: 8.4 mg/dL — ABNORMAL LOW (ref 8.9–10.3)
Chloride: 105 mmol/L (ref 98–111)
Creatinine, Ser: 0.94 mg/dL (ref 0.61–1.24)
GFR calc Af Amer: >60 mL/min (ref >60)
GFR calc non Af Amer: >60 mL/min (ref >60)
Glucose, Bld: 104 mg/dL — ABNORMAL HIGH (ref 70–99)
Potassium: 4.2 mmol/L (ref 3.5–5.1)
Sodium: 137 mmol/L (ref 135–145)
Total Bilirubin: 0.7 mg/dL (ref 0.3–1.2)
Total Protein: 6 g/dL — ABNORMAL LOW (ref 6.5–8.1)

## 2019-03-19 LAB — CBC WITH DIFFERENTIAL/PLATELET
Abs Immature Granulocytes: 0.02 10*3/uL (ref 0.00–0.07)
BASOS PCT: 0 %
Basophils Absolute: 0 10*3/uL (ref 0.0–0.1)
Eosinophils Absolute: 0 10*3/uL (ref 0.0–0.5)
Eosinophils Relative: 0 %
HCT: 33.2 % — ABNORMAL LOW (ref 39.0–52.0)
Hemoglobin: 10.5 g/dL — ABNORMAL LOW (ref 13.0–17.0)
Immature Granulocytes: 0 %
Lymphocytes Relative: 20 %
Lymphs Abs: 1.4 10*3/uL (ref 0.7–4.0)
MCH: 29.4 pg (ref 26.0–34.0)
MCHC: 31.6 g/dL (ref 30.0–36.0)
MCV: 93 fL (ref 80.0–100.0)
MONOS PCT: 9 %
Monocytes Absolute: 0.6 10*3/uL (ref 0.1–1.0)
Neutro Abs: 4.9 10*3/uL (ref 1.7–7.7)
Neutrophils Relative %: 71 %
Platelets: 231 10*3/uL (ref 150–400)
RBC: 3.57 MIL/uL — ABNORMAL LOW (ref 4.22–5.81)
RDW: 11.8 % (ref 11.5–15.5)
WBC: 7.1 10*3/uL (ref 4.0–10.5)
nRBC: 0 % (ref 0.0–0.2)

## 2019-03-19 LAB — CK: Total CK: 185 U/L (ref 49–397)

## 2019-03-19 LAB — MAGNESIUM: Magnesium: 1.9 mg/dL (ref 1.7–2.4)

## 2019-03-19 MED ORDER — DEXMEDETOMIDINE HCL IN NACL 400 MCG/100ML IV SOLN
0.2000 ug/kg/h | INTRAVENOUS | Status: DC
  Administered 2019-03-19: 0.4 ug/kg/h via INTRAVENOUS
  Administered 2019-03-19: 0.5 ug/kg/h via INTRAVENOUS
  Administered 2019-03-19: 0.4 ug/kg/h via INTRAVENOUS
  Filled 2019-03-19 (×3): qty 100

## 2019-03-19 MED ORDER — ORAL CARE MOUTH RINSE: 15.0000 mL | Freq: Two times a day (BID) | OROMUCOSAL | Status: DC

## 2019-03-19 NOTE — Progress Notes (Addendum)
NAME:  Mark Strickland, MRN:  [id number], DOB:  April 06, 1991, LOS: 1 ADMISSION DATE:  03/18/2019, CONSULTATION DATE:  3/302020 REFERRING MD:  EDP, CHIEF COMPLAINT: Overdose, respiratory failure  Brief History   28 year old with substance abuse presenting with resume drug overdose.  Patient became unresponsive while talking with a friend over phone.  Given 1 round of Narcan by first responders with some improvement Given another round of Narcan in ED.  Continues to be somnolent.  Reports fever, dyspnea at home.  Cannot elicit any history of travel or contacts. Hospitalist called initially however he had rapid progressive increase in oxygen requirements.  No nonrebreather Hence pulmonary was consulted.  Past Medical History  None  Significant Hospital Events     Consults:    Procedures:    Significant Diagnostic Tests:  Chest x-ray 3/30-bilateral pulmonary infiltrates right greater than left.  I have reviewed the images personally.  Micro Data:  Bcx 3/30 Flu, RVP 3/30 > neg Coronavirus 3/30 >   Antimicrobials:  Ceftriaxone 3/30 >> Azithromycin 3/20 >>  Interim history/subjective:  More awake. Still on NRB Precedex started for agitation.   Objective   Blood pressure (!) 123/55, pulse 86, temperature (!) 102 F (38.9 C), temperature source Axillary, resp. rate 20, height 5\' 11"  (1.803 m), weight 79.4 kg, SpO2 98 %.        Intake/Output Summary (Last 24 hours) at 03/19/2019 1035 Last data filed at 03/19/2019 0807 Gross per 24 hour  Intake 1720.15 ml  Output 1550 ml  Net 170.15 ml   Filed Weights   03/18/19 1356  Weight: 79.4 kg    Examination: Blood pressure 128/63, pulse 82, temperature (!) 102 F (38.9 C), temperature source Axillary, resp. rate (!) 26, height 5\' 11"  (1.803 m), weight 79.4 kg, SpO2 99 %. Gen:      No acute distress HEENT:  EOMI, sclera anicteric Neck:     No masses; no thyromegaly Lungs:    Clear to auscultation bilaterally; normal  respiratory effort CV:         Regular rate and rhythm; no murmurs Abd:      + bowel sounds; soft, non-tender; no palpable masses, no distension Ext:    No edema; adequate peripheral perfusion Skin:      Warm and dry; no rash Neuro: Awake, fidgety  Resolved Hospital Problem list     Assessment & Plan:  28 year old with acute hypoxic respiratory failure with bilateral pulmonary infiltrates Could be aspiration pneumonia in the setting of drug overdose.  Will need to be ruled out for COVID-19  Respiratory failure Keep in ICU Wean down O2 as tolerated Continue close monitoring Ceftriaxone, azithromycin Keep n.p.o.  Drug overdose Monitor for withdrawal symptoms. Precedex  Best practice:  Diet: N.p.o. Pain/Anxiety/Delirium protocol (if indicated): NA VAP protocol (if indicated): NA DVT prophylaxis: Lovenox GI prophylaxis: NA Glucose control: Monitor blood sugars on metabolic panel. Mobility: Bed Code Status: Full Family Communication: Mother updated. Disposition: ICU  Labs   CBC: Recent Labs  Lab 03/18/19 1406 03/19/19 0256  WBC 8.7 7.1  NEUTROABS 6.3 4.9  HGB 10.4* 10.5*  HCT 33.2* 33.2*  MCV 94.9 93.0  PLT 231 231    Basic Metabolic Panel: Recent Labs  Lab 03/18/19 1406 03/19/19 0256  NA 140 137  K 3.4* 4.2  CL 104 105  CO2 28 25  GLUCOSE 116* 104*  BUN 27* 19  CREATININE 1.09 0.94  CALCIUM 8.1* 8.4*  MG  --  1.9   GFR: Estimated Creatinine  Clearance: 123.5 mL/min (by C-G formula based on SCr of 0.94 mg/dL). Recent Labs  Lab 03/18/19 1406 03/18/19 1644 03/19/19 0256  PROCALCITON  --  3.27  --   WBC 8.7  --  7.1    Liver Function Tests: Recent Labs  Lab 03/19/19 0256  AST 22  ALT 14  ALKPHOS 36*  BILITOT 0.7  PROT 6.0*  ALBUMIN 2.9*   No results for input(s): LIPASE, AMYLASE in the last 168 hours. No results for input(s): AMMONIA in the last 168 hours.  ABG No results found for: PHART, PCO2ART, PO2ART, HCO3, TCO2, ACIDBASEDEF,  O2SAT   Coagulation Profile: No results for input(s): INR, PROTIME in the last 168 hours.  Cardiac Enzymes: Recent Labs  Lab 03/19/19 0256  CKTOTAL 185    HbA1C: No results found for: HGBA1C  CBG: Recent Labs  Lab 03/18/19 1402  GLUCAP 107*   The patient is critically ill with multiple organ system failure and requires high complexity decision making for assessment and support, frequent evaluation and titration of therapies, advanced monitoring, review of radiographic studies and interpretation of complex data.   Critical Care Time devoted to patient care services, exclusive of separately billable procedures, described in this note is 35 minutes.   Chilton Greathouse MD [facility] Pager [phone] If no answer call [phone] 03/19/2019, 10:52 AM

## 2019-03-20 LAB — COMPREHENSIVE METABOLIC PANEL
ALT: 15 U/L (ref 0–44)
AST: 22 U/L (ref 15–41)
Albumin: 3.2 g/dL — ABNORMAL LOW (ref 3.5–5.0)
Alkaline Phosphatase: 44 U/L (ref 38–126)
Anion gap: 9 (ref 5–15)
BUN: 22 mg/dL — ABNORMAL HIGH (ref 6–20)
CO2: 24 mmol/L (ref 22–32)
Calcium: 8.7 mg/dL — ABNORMAL LOW (ref 8.9–10.3)
Chloride: 103 mmol/L (ref 98–111)
Creatinine, Ser: 0.85 mg/dL (ref 0.61–1.24)
GFR calc Af Amer: >60 mL/min (ref >60)
GFR calc non Af Amer: >60 mL/min (ref >60)
Glucose, Bld: 95 mg/dL (ref 70–99)
POTASSIUM: 3.6 mmol/L (ref 3.5–5.1)
Sodium: 136 mmol/L (ref 135–145)
Total Bilirubin: 0.6 mg/dL (ref 0.3–1.2)
Total Protein: 6.8 g/dL (ref 6.5–8.1)

## 2019-03-20 LAB — CBC WITH DIFFERENTIAL/PLATELET
Abs Immature Granulocytes: 0.03 10*3/uL (ref 0.00–0.07)
BASOS ABS: 0 10*3/uL (ref 0.0–0.1)
Basophils Relative: 0 %
Eosinophils Absolute: 0.1 10*3/uL (ref 0.0–0.5)
Eosinophils Relative: 2 %
HCT: 34.3 % — ABNORMAL LOW (ref 39.0–52.0)
Hemoglobin: 11.2 g/dL — ABNORMAL LOW (ref 13.0–17.0)
IMMATURE GRANULOCYTES: 1 %
Lymphocytes Relative: 17 %
Lymphs Abs: 1 10*3/uL (ref 0.7–4.0)
MCH: 29.2 pg (ref 26.0–34.0)
MCHC: 32.7 g/dL (ref 30.0–36.0)
MCV: 89.6 fL (ref 80.0–100.0)
Monocytes Absolute: 0.7 10*3/uL (ref 0.1–1.0)
Monocytes Relative: 11 %
NRBC: 0 % (ref 0.0–0.2)
Neutro Abs: 4.4 10*3/uL (ref 1.7–7.7)
Neutrophils Relative %: 69 %
PLATELETS: 288 10*3/uL (ref 150–400)
RBC: 3.83 MIL/uL — ABNORMAL LOW (ref 4.22–5.81)
RDW: 11.5 % (ref 11.5–15.5)
WBC: 6.2 10*3/uL (ref 4.0–10.5)

## 2019-03-20 LAB — CK: Total CK: 84 U/L (ref 49–397)

## 2019-03-20 LAB — MAGNESIUM: Magnesium: 1.9 mg/dL (ref 1.7–2.4)

## 2019-03-20 MED ORDER — ZOLPIDEM TARTRATE 5 MG PO TABS
5.0000 mg | ORAL_TABLET | Freq: Every evening | ORAL | Status: DC | PRN
  Administered 2019-03-20 – 2019-03-21 (×2): 5 mg via ORAL
  Filled 2019-03-20 (×2): qty 1

## 2019-03-20 MED ORDER — LORAZEPAM 2 MG/ML IJ SOLN
2.0000 mg | INTRAMUSCULAR | Status: DC | PRN
  Administered 2019-03-20: 16:00:00 2 mg via INTRAVENOUS
  Administered 2019-03-20 (×2): 3 mg via INTRAVENOUS
  Administered 2019-03-20 – 2019-03-21 (×4): 2 mg via INTRAVENOUS
  Filled 2019-03-20 (×2): qty 1
  Filled 2019-03-20: qty 2
  Filled 2019-03-20 (×3): qty 1
  Filled 2019-03-20: qty 2
  Filled 2019-03-20: qty 1

## 2019-03-20 MED ORDER — CHLORHEXIDINE GLUCONATE CLOTH 2 % EX PADS: 6.0000 | MEDICATED_PAD | Freq: Every day | CUTANEOUS | Status: DC

## 2019-03-20 NOTE — Progress Notes (Signed)
eLink Physician-Brief Progress Note Patient Name: Rocklan Gaber DOB: 1991-07-06 MRN: [id number]   Date of Service  03/20/2019  HPI/Events of Note  Request to transfer to Telemetry bed. Patient now on room air. VSS.   eICU Interventions  Will transfer to Telemetry bed.      Intervention Category Major Interventions: Other:  Lenell Antu 03/20/2019, 10:35 PM

## 2019-03-20 NOTE — Progress Notes (Signed)
NAME:  Mark Strickland, MRN:  [id number], DOB:  June 20, 1991, LOS: 2 ADMISSION DATE:  03/18/2019, CONSULTATION DATE:  3/302020 REFERRING MD:  EDP, CHIEF COMPLAINT: Overdose, respiratory failure  Brief History   28 year old with substance abuse presenting with resume drug overdose.  Patient became unresponsive while talking with a friend over phone.  Given 1 round of Narcan by first responders with some improvement. Given another round of Narcan in ED.  Continued to be somnolent.  Reports fever, dyspnea at home.  Cannot elicit any history of travel or contacts. Hospitalist called initially however he had rapid progressive increase in oxygen requirements.  No nonrebreather. Hence pulmonary was consulted.  Past Medical History  Drug abuse  Significant Hospital Events   3/30  Admit with suspected heroin overdose 4/01  Pt denies suicidal intent   Consults:    Procedures:    Significant Diagnostic Tests:  Chest x-ray 3/30-bilateral pulmonary infiltrates right greater than left.  I have reviewed the images personally.  Micro Data:  BCx2 3/30 >> Flu, RVP 3/30 > neg Coronavirus 3/30 >   Antimicrobials:  Ceftriaxone 3/30 >> Azithromycin 3/20 >>  Interim history/subjective:  Awake, off [place] O2.  Pt denies suicidal intent.  Asking for food.   Objective   Blood pressure 116/67, pulse 65, temperature 98.3 F (36.8 C), temperature source Oral, resp. rate (!) 23, height 5\' 11"  (1.803 m), weight 79.4 kg, SpO2 100 %.        Intake/Output Summary (Last 24 hours) at 03/20/2019 0944 Last data filed at 03/20/2019 3383 Gross per 24 hour  Intake 871.27 ml  Output 1500 ml  Net -628.73 ml   Filed Weights   03/18/19 1356  Weight: 79.4 kg    Examination: General: young adult male lying in bed in NAD  HEENT: MM pink/moist, no jvd Neuro: AAOx4, speech clear, soft spoken CV: s1s2 rrr, no m/r/g PULM: even/non-labored, lungs bilaterally diminished, bibasilar crackles  AN:VBTY, non-tender, bsx4  active  Extremities: warm/dry, no edema  Skin: no rashes or lesions, multiple tattoos   Resolved Hospital Problem list     Assessment & Plan:   Acute Hypoxic Respiratory Failure with Bilateral Infiltrates  -suspect overdose with aspiration, r/o COVID P: Transfer to SDU  Continue rocephin, azithromycin  O2 if needed to support sats > 90%  Drug Overdose -suspected heroin  -mother reported may have been intentional > pt denies intent  -denies daily drug use P: Monitor for withdrawal symptoms  Stop precedex  PRN CIWA (although pt denies regular ETOH use) Await COIVD rule out, once off isolation, would consider PSY evaluation   Best practice:  Diet: N.p.o. Pain/Anxiety/Delirium protocol (if indicated): NA VAP protocol (if indicated): NA DVT prophylaxis: Lovenox GI prophylaxis: NA Glucose control: NA Mobility: Bed Code Status: Full Family Communication: Patient updated on plan of care Disposition: SDU, tx to [facility] as of 4/2 am.   Labs   CBC: Recent Labs  Lab 03/18/19 1406 03/19/19 0256 03/20/19 0246  WBC 8.7 7.1 6.2  NEUTROABS 6.3 4.9 4.4  HGB 10.4* 10.5* 11.2*  HCT 33.2* 33.2* 34.3*  MCV 94.9 93.0 89.6  PLT 231 231 288    Basic Metabolic Panel: Recent Labs  Lab 03/18/19 1406 03/19/19 0256 03/20/19 0246  NA 140 137 136  K 3.4* 4.2 3.6  CL 104 105 103  CO2 28 25 24   GLUCOSE 116* 104* 95  BUN 27* 19 22*  CREATININE 1.09 0.94 0.85  CALCIUM 8.1* 8.4* 8.7*  MG  --  1.9  1.9   GFR: Estimated Creatinine Clearance: 136.6 mL/min (by C-G formula based on SCr of 0.85 mg/dL). Recent Labs  Lab 03/18/19 1406 03/18/19 1644 03/19/19 0256 03/20/19 0246  PROCALCITON  --  3.27  --   --   WBC 8.7  --  7.1 6.2    Liver Function Tests: Recent Labs  Lab 03/19/19 0256 03/20/19 0246  AST 22 22  ALT 14 15  ALKPHOS 36* 44  BILITOT 0.7 0.6  PROT 6.0* 6.8  ALBUMIN 2.9* 3.2*   No results for input(s): LIPASE, AMYLASE in the last 168 hours. No results for  input(s): AMMONIA in the last 168 hours.  ABG No results found for: PHART, PCO2ART, PO2ART, HCO3, TCO2, ACIDBASEDEF, O2SAT   Coagulation Profile: No results for input(s): INR, PROTIME in the last 168 hours.  Cardiac Enzymes: Recent Labs  Lab 03/19/19 0256 03/20/19 0246  CKTOTAL 185 84    HbA1C: No results found for: HGBA1C  CBG: Recent Labs  Lab 03/18/19 1402  GLUCAP 107*     Canary Brim, NP-C [facility] Pgr: [phone] or if no answer [phone] 03/20/2019, 9:44 AM

## 2019-03-20 NOTE — Progress Notes (Signed)
MD called at this time as pt is appropriate for transfer to telemetry department. He is now stable on RA and so it was discussed to decrease pt to droplet and contact precautions at this time.

## 2019-03-21 ENCOUNTER — Inpatient Hospital Stay (HOSPITAL_COMMUNITY): Payer: Self-pay

## 2019-03-21 DIAGNOSIS — R918 Other nonspecific abnormal finding of lung field: Secondary | ICD-10-CM

## 2019-03-21 DIAGNOSIS — T1491XA Suicide attempt, initial encounter: Secondary | ICD-10-CM | POA: Diagnosis not present

## 2019-03-21 DIAGNOSIS — T401X2A Poisoning by heroin, intentional self-harm, initial encounter: Secondary | ICD-10-CM

## 2019-03-21 DIAGNOSIS — J690 Pneumonitis due to inhalation of food and vomit: Secondary | ICD-10-CM | POA: Diagnosis not present

## 2019-03-21 DIAGNOSIS — T401X1A Poisoning by heroin, accidental (unintentional), initial encounter: Secondary | ICD-10-CM | POA: Diagnosis not present

## 2019-03-21 LAB — LEGIONELLA PNEUMOPHILA SEROGP 1 UR AG: L. pneumophila Serogp 1 Ur Ag: NEGATIVE

## 2019-03-21 LAB — COMPREHENSIVE METABOLIC PANEL
ALT: 15 U/L (ref 0–44)
AST: 24 U/L (ref 15–41)
Albumin: 3.4 g/dL — ABNORMAL LOW (ref 3.5–5.0)
Alkaline Phosphatase: 44 U/L (ref 38–126)
Anion gap: 11 (ref 5–15)
BUN: 24 mg/dL — ABNORMAL HIGH (ref 6–20)
CO2: 20 mmol/L — ABNORMAL LOW (ref 22–32)
Calcium: 9 mg/dL (ref 8.9–10.3)
Chloride: 108 mmol/L (ref 98–111)
Creatinine, Ser: 0.91 mg/dL (ref 0.61–1.24)
GFR calc Af Amer: >60 mL/min (ref >60)
GFR calc non Af Amer: >60 mL/min (ref >60)
Glucose, Bld: 111 mg/dL — ABNORMAL HIGH (ref 70–99)
Potassium: 3.3 mmol/L — ABNORMAL LOW (ref 3.5–5.1)
Sodium: 139 mmol/L (ref 135–145)
Total Bilirubin: 0.5 mg/dL (ref 0.3–1.2)
Total Protein: 7.4 g/dL (ref 6.5–8.1)

## 2019-03-21 LAB — CBC WITH DIFFERENTIAL/PLATELET
Abs Immature Granulocytes: 0.05 10*3/uL (ref 0.00–0.07)
Basophils Absolute: 0 10*3/uL (ref 0.0–0.1)
Basophils Relative: 0 %
Eosinophils Absolute: 0 10*3/uL (ref 0.0–0.5)
Eosinophils Relative: 0 %
HCT: 36.8 % — ABNORMAL LOW (ref 39.0–52.0)
Hemoglobin: 12.2 g/dL — ABNORMAL LOW (ref 13.0–17.0)
Immature Granulocytes: 1 %
Lymphocytes Relative: 13 %
Lymphs Abs: 0.9 10*3/uL (ref 0.7–4.0)
MCH: 29.7 pg (ref 26.0–34.0)
MCHC: 33.2 g/dL (ref 30.0–36.0)
MCV: 89.5 fL (ref 80.0–100.0)
Monocytes Absolute: 0.7 10*3/uL (ref 0.1–1.0)
Monocytes Relative: 9 %
Neutro Abs: 5.4 10*3/uL (ref 1.7–7.7)
Neutrophils Relative %: 77 %
Platelets: 352 10*3/uL (ref 150–400)
RBC: 4.11 MIL/uL — ABNORMAL LOW (ref 4.22–5.81)
RDW: 11.9 % (ref 11.5–15.5)
WBC: 7 10*3/uL (ref 4.0–10.5)
nRBC: 0 % (ref 0.0–0.2)

## 2019-03-21 LAB — NOVEL CORONAVIRUS, NAA (HOSP ORDER, SEND-OUT TO REF LAB; TAT 18-24 HRS): SARS-CoV-2, NAA: NOT DETECTED

## 2019-03-21 LAB — CK: Total CK: 55 U/L (ref 49–397)

## 2019-03-21 LAB — INTERLEUKIN-6, PLASMA: Interleukin-6, Plasma: 709.9 pg/mL — ABNORMAL HIGH (ref 0.0–12.2)

## 2019-03-21 LAB — MAGNESIUM: Magnesium: 2.3 mg/dL (ref 1.7–2.4)

## 2019-03-21 MED ORDER — LORAZEPAM 1 MG PO TABS
1.0000 mg | ORAL_TABLET | Freq: Once | ORAL | Status: AC
  Administered 2019-03-21: 1 mg via ORAL
  Filled 2019-03-21: qty 1

## 2019-03-21 MED ORDER — POTASSIUM CHLORIDE CRYS ER 20 MEQ PO TBCR
40.0000 meq | EXTENDED_RELEASE_TABLET | Freq: Once | ORAL | Status: AC
  Administered 2019-03-21: 40 meq via ORAL
  Filled 2019-03-21: qty 2

## 2019-03-21 MED ORDER — AMOXICILLIN-POT CLAVULANATE 875-125 MG PO TABS
1.0000 | ORAL_TABLET | Freq: Two times a day (BID) | ORAL | Status: DC
  Administered 2019-03-21 – 2019-03-22 (×3): 1 via ORAL
  Filled 2019-03-21 (×3): qty 1

## 2019-03-21 NOTE — Progress Notes (Signed)
[facility] PROGRESS NOTE  Keshun Berrett ZOX:[id number] DOB: 1991/03/24 DOA: 03/18/2019  PCP: Patient, No Pcp Per  Brief History/Interval Summary: 28 year old with substance abuse presenting with heroin overdose.  Patient apparently became unresponsive.  Patient was given Narcan by first responders.  He was seen in the emergency department.  He had worsening in his respiratory status and so overall he was admitted initially to the intensive care unit.  He was stabilized and then transferred to the floor.  Reason for Visit: Heroin overdose  Consultants: Psychiatry  Procedures: None  Antibiotics: He was on ceftriaxone and azithromycin.  Will change to Augmentin.  Subjective/Interval History: Patient somewhat distracted.  But denies any pain.  No shortness of breath or cough.  No nausea or vomiting.  ROS: Denies diarrhea.  Objective:  Vital Signs  Vitals:   03/20/19 1900 03/20/19 2026 03/20/19 2358 03/21/19 0320  BP: 136/70 128/66 117/71 (!) 109/45  Pulse: 65 63 (!) 56 (!) 57  Resp: 16 (!) 34 14 18  Temp:  98.1 F (36.7 C) 99.5 F (37.5 C) 98.8 F (37.1 C)  TempSrc:  Oral Oral Oral  SpO2: 94% 98% 96% 100%  Weight:      Height:        Intake/Output Summary (Last 24 hours) at 03/21/2019 1352 Last data filed at 03/21/2019 0400 Gross per 24 hour  Intake 386.5 ml  Output 200 ml  Net 186.5 ml   Filed Weights   03/18/19 1356  Weight: 79.4 kg    General appearance: alert, cooperative, appears stated age and no distress Head: Normocephalic, without obvious abnormality, atraumatic Resp: clear to auscultation bilaterally Cardio: regular rate and rhythm, S1, S2 normal, no murmur, click, rub or gallop GI: soft, non-tender; bowel sounds normal; no masses,  no organomegaly Extremities: extremities normal, atraumatic, no cyanosis or edema Pulses: 2+ and symmetric Skin: Skin color, texture, turgor normal. No rashes or lesions Neurologic: No obvious focal  neurological deficits.  Lab Results:  Data Reviewed: I have personally reviewed following labs and imaging studies  CBC: Recent Labs  Lab 03/18/19 1406 03/19/19 0256 03/20/19 0246 03/21/19 0736  WBC 8.7 7.1 6.2 7.0  NEUTROABS 6.3 4.9 4.4 5.4  HGB 10.4* 10.5* 11.2* 12.2*  HCT 33.2* 33.2* 34.3* 36.8*  MCV 94.9 93.0 89.6 89.5  PLT 231 231 288 352    Basic Metabolic Panel: Recent Labs  Lab 03/18/19 1406 03/19/19 0256 03/20/19 0246 03/21/19 0736  NA 140 137 136 139  K 3.4* 4.2 3.6 3.3*  CL 104 105 103 108  CO2 20*  GLUCOSE 116* 104* 95 111*  BUN 27* 19 22* 24*  CREATININE 1.09 0.94 0.85 0.91  CALCIUM 8.1* 8.4* 8.7* 9.0  MG  --  1.9 1.9 2.3    GFR: Estimated Creatinine Clearance: 127.6 mL/min (by C-G formula based on SCr of 0.91 mg/dL).  Liver Function Tests: Recent Labs  Lab 03/19/19 0256 03/20/19 0246 03/21/19 0736  AST ALT ALKPHOS 36* 44 44  BILITOT 0.7 0.6 0.5  PROT 6.0* 6.8 7.4  ALBUMIN 2.9* 3.2* 3.4*    Cardiac Enzymes: Recent Labs  Lab 03/19/19 0256 03/20/19 0246 03/21/19 0736  CKTOTAL 185 84 55    CBG: Recent Labs  Lab 03/18/19 1402  GLUCAP 107*    Anemia Panel: Recent Labs    03/18/19 1644  FERRITIN 135    Recent Results (from the past 240 hour(s))  Culture, blood (Routine X  2) w Reflex to ID Panel     Status: None (Preliminary result)   Collection Time: 03/18/19  5:05 PM  Result Value Ref Range Status   Specimen Description   Final    BLOOD ARM LEFT Performed at [facility], [address] [place], [place] [address]    Special Requests   Final    BOTTLES DRAWN AEROBIC AND ANAEROBIC Blood Culture results may not be optimal due to an excessive volume of blood received in culture bottles Performed at [facility], [address] [place], [place] [address]    Culture   Final    NO GROWTH 2 DAYS Performed at [facility], [address]  [place], [place] [address]    Report Status PENDING  Incomplete  Culture, blood (Routine X 2) w Reflex to ID Panel     Status: None (Preliminary result)   Collection Time: 03/18/19  5:10 PM  Result Value Ref Range Status   Specimen Description   Final    BLOOD RIGHT ANTECUBITAL Performed at [facility], [address], [place], [place] [address]    Special Requests   Final    BOTTLES DRAWN AEROBIC AND ANAEROBIC Blood Culture adequate volume Performed at [facility], [address] [place], [place] [address]    Culture   Final    NO GROWTH 2 DAYS Performed at [facility], [address], [place], [place] [address]    Report Status PENDING  Incomplete  Novel Coronavirus, NAA (hospital order; send-out to ref lab)     Status: None   Collection Time: 03/18/19  5:30 PM  Result Value Ref Range Status   SARS-CoV-2, NAA NOT DETECTED NOT DETECTED Final    Comment: Negative (Not Detected) results do not exclude infection caused by SARS CoV 2 and should not be used as the sole basis for treatment or other patient management decisions. Optimum specimen types and timing for peak viral levels during infections caused  by SARS CoV 2 have not been determined. Collection of multiple specimens (types and time points) from the same patient may be necessary to detect the virus. Improper specimen collection and handling, sequence variability underlying assay primers and or probes, or the presence of organisms in  quantities less than the limit of detection of the assay may lead to false negative results. Positive and negative predictive values of testing are highly dependent on prevalence. False negative results are more likely when prevalence of disease is high. (NOTE) The expected result is Negative (Not Detected). The SARS CoV 2 test is intended for the presumptive qualitative  detection of nucleic acid from SARS CoV 2 in upper and lower  respir atory  specimens. Testing methodology is real time RT PCR. Test results must be correlated with clinical presentation and  evaluated in the context of other laboratory and epidemiologic data.  Test performance can be affected because the epidemiology and  clinical spectrum of infection caused by SARS CoV 2 is not fully  known. For example, the optimum types of specimens to collect and  when during the course of infection these specimens are most likely  to contain detectable viral RNA may not be known. This test has not been Food and Drug Administration (FDA) cleared or  approved and has been authorized by FDA under an Emergency Use  Authorization (EUA). The test is only authorized for the duration of  the declaration that circumstances exist justifying  the authorization  of emergency use of in vitro diagnostic tests for detection and or  diagnosis of SARS CoV 2 under Section 564(b)(1) of the Act, 21 U.S.C.  section [phone] 3(b)(1), unless the authorization is terminated or   revoked sooner. [facility] is certified under the  Clinical Laboratory Improvement Amendments of 1988 (CLIA), 42 U.S.C.  section [phone], to perform high complexity tests. Performed at [facility] CLIA [id number] [address], [place], [place] [address] Laboratory Director: Turner Daniels, MD Performed at [facility], [address], [place], [place] [address]    Coronavirus Source NASOPHARYNGEAL  Final    Comment: Performed at [facility], [address], [place], [place] [address]  Respiratory Panel by PCR     Status: None   Collection Time: 03/18/19  5:30 PM  Result Value Ref Range Status   Adenovirus NOT DETECTED NOT DETECTED Final   Coronavirus 229E NOT DETECTED NOT DETECTED Final    Comment: (NOTE) The Coronavirus on the Respiratory Panel, DOES NOT test for the novel  Coronavirus (2019 nCoV)    Coronavirus HKU1 NOT DETECTED NOT  DETECTED Final   Coronavirus NL63 NOT DETECTED NOT DETECTED Final   Coronavirus OC43 NOT DETECTED NOT DETECTED Final   Metapneumovirus NOT DETECTED NOT DETECTED Final   Rhinovirus / Enterovirus NOT DETECTED NOT DETECTED Final   Influenza A NOT DETECTED NOT DETECTED Final   Influenza B NOT DETECTED NOT DETECTED Final   Parainfluenza Virus 1 NOT DETECTED NOT DETECTED Final   Parainfluenza Virus 2 NOT DETECTED NOT DETECTED Final   Parainfluenza Virus 3 NOT DETECTED NOT DETECTED Final   Parainfluenza Virus 4 NOT DETECTED NOT DETECTED Final   Respiratory Syncytial Virus NOT DETECTED NOT DETECTED Final   Bordetella pertussis NOT DETECTED NOT DETECTED Final   Chlamydophila pneumoniae NOT DETECTED NOT DETECTED Final   Mycoplasma pneumoniae NOT DETECTED NOT DETECTED Final    Comment: Performed at [facility], [address], [place], [place] [address]  MRSA PCR Screening     Status: None   Collection Time: 03/18/19  8:36 PM  Result Value Ref Range Status   MRSA by PCR NEGATIVE NEGATIVE Final    Comment:        The GeneXpert MRSA Assay (FDA approved for NASAL specimens only), is one component of a comprehensive MRSA colonization surveillance program. It is not intended to diagnose MRSA infection nor to guide or monitor treatment for MRSA infections. Performed at [facility], [address], [place], [place] [address]       Radiology Studies: Dg Chest Port 1 View  Result Date: 03/21/2019 CLINICAL DATA:  Respiratory failure and hypoxia EXAM: PORTABLE CHEST 1 VIEW COMPARISON:  03/18/2019 FINDINGS: Cardiac shadow is mildly prominent but accentuated by the portable technique. The lungs are well aerated bilaterally. Previously seen infiltrative changes in both lungs have significantly improved when compared with the prior study. Some residual density is noted. No sizable effusion or pneumothorax is seen. IMPRESSION: Significant improved aeration bilaterally.  Electronically Signed   By: Alcide Clever M.D.   On: 03/21/2019 09:42     Medications:  Scheduled: . amoxicillin-clavulanate  1 tablet Oral Q12H  . docusate sodium  100 mg Oral BID  . enoxaparin (LOVENOX) injection  40 mg Subcutaneous Q24H  . folic acid  1 mg Oral Daily  . multivitamin with minerals  1 tablet Oral Daily  . thiamine  100 mg Oral Daily   Continuous: . sodium chloride Stopped (03/20/19 1742)   ENI:DPOEUM chloride, acetaminophen, bisacodyl, ondansetron **OR** ondansetron (ZOFRAN) IV, polyethylene glycol, zolpidem    Assessment/Plan:  Acute respiratory failure with hypoxia This is most likely due to drug overdose along with aspiration pneumonia.  Seems to be stable currently.  He is off of oxygen.  COVID-19 test is negative.  Aspiration pneumonia Seems to be stable from a respiratory standpoint.  He was placed on azithromycin and ceftriaxone.  He lost his IV access this morning.  He is not allergic to penicillin.  He will be changed over to Augmentin.  Heroin overdose Stable.  Patient was counseled.  No withdrawal symptoms currently.  His mother was concerned about possible suicidal ideation previously.  Patient seen by psychiatry this morning and they are recommending inpatient psychiatric care.  Sitter is recommended.  Apparently patient is agreeable to this.  May need to be involuntarily committed if he decides to leave the hospital AGAINST MEDICAL ADVICE.  Hypokalemia Will be repleted.  Magnesium is normal.  Normocytic anemia Outpatient evaluation.   DVT Prophylaxis: Lovenox    Code Status: Full code Family Communication: Discussed with the patient Disposition Plan: Management as outlined above.  Will need to be transferred to inpatient psychiatry.   Okay to discontinue telemetry.  Last EKG reviewed. Patient is medically stable at this time and could be transferred to [facility] if bed is available.    LOS: 3 days   Jumana Paccione [employer] on  [email or web address]  03/21/2019, 1:52 PM

## 2019-03-21 NOTE — Plan of Care (Signed)
Plan of care reviewed and discussed with the patient. 

## 2019-03-21 NOTE — Consult Note (Addendum)
[facility] Face-to-Face Psychiatry Consult   Reason for Consult:  Heroin overdose  Referring Physician:  Dr. Osvaldo Shipper  Patient Identification: Mark Strickland MRN:  [id number] Principal Diagnosis: Suicide attempt [facility]) Diagnosis:  Active Problems:   Acute respiratory failure with hypoxia (HCC)   Drug overdose   Total Time spent with patient: 1 hour  Subjective:   Mark Strickland is a 28 y.o. male patient admitted with heroin overdose.  HPI:   Per chart review, patient was admitted with heroin overdose. He became unresponsive while talking to a friend over the phone. He was given one round of Narcan by EMS with some improvement. He was given another round of Narcan in the ED. He required a nonrebreather. His hospital course has been complicated by acute hypoxic respiratory failure with bilateral infiltrates. He was placed on airborne and droplet precautions. His test came back negative for Covid. His mother reports that his overdose was a suicide attempt although the patient denies a suicide attempt. UDS was positive for benzodiazepines, opiates and cocaine on admission.   On interview, Mark Strickland denies attempting suicide. He reports that his overdose was unintentional. His friend that he was on the phone with while using heroin came to his house after he became unresponsive. He reportedly performed CPR until EMS arrived. He reports that he was doing well for the past year and he is unsure why his mother thought that he would harm himself. He reports that he is working at a good job. He denies problems with depression although he admits to intermittent depressed mood in the past that has not been sustained. He denies SI, HI or AVH. He denies a history of suicide attempts. He denies problems with sleep or appetite. He denies a history of manic symptoms (decreased need for sleep, increased energy, pressured speech or euphoria). He appears to minimize his heroin use. He initially reports using 2-3 times  weekly and then reports using twice in the past few months with last use being 9 months ago. He reports that he was hospitalized a year ago for heroin use. He is more concerned with going to the gift shop to get a shirt and previously asked his nurse to be allowed to go to the store to get a Advertising account planner. He provides verbal consent to speak to his mother. He is aware that the recommendation may be made for inpatient psychiatric hospitalization if there are concerns for his safety and he demonstrates understanding.   Patient's mother was contacted by phone. She reports that he has been depressed for at least a month. She reports that he has made comments that he wants to harm himself to her and his friends. He has a history of suicide attempt by heroin overdose 9 months ago. She reports he self-medicates with heroin when his suicidal thoughts become overwhelming and then uses heroin to harm himself. She is unaware if he has been admitted to a psychiatric hospital in the past.   Past Psychiatric History: Opioid (heroin) abuse   Risk to Self:  Yes due to suspected suicide attempt.  Risk to Others:  None. Denies HI.  Prior Inpatient Therapy:  Denies  Prior Outpatient Therapy:  Denies   Past Medical History:  Past Medical History:  Diagnosis Date  . Heroin abuse (HCC)    History reviewed. No pertinent surgical history. Family History: History reviewed. No pertinent family history. Family Psychiatric  History: Paternal uncle-alcohol abuse.   Social History:  Social History   Substance and Sexual Activity  Alcohol Use Not Currently     Social History   Substance and Sexual Activity  Drug Use Yes  . Types: IV, Heroin    Social History   Socioeconomic History  . Marital status: Single    Spouse name: Not on file  . Number of children: Not on file  . Years of education: Not on file  . Highest education level: Not on file  Occupational History  . Not on file  Social Needs  . Financial  resource strain: Not on file  . Food insecurity:    Worry: Not on file    Inability: Not on file  . Transportation needs:    Medical: Not on file    Non-medical: Not on file  Tobacco Use  . Smoking status: Current Every Day Smoker    Packs/day: 0.50    Types: Cigarettes  . Smokeless tobacco: Never Used  Substance and Sexual Activity  . Alcohol use: Not Currently  . Drug use: Yes    Types: IV, Heroin  . Sexual activity: Not on file  Lifestyle  . Physical activity:    Days per week: Not on file    Minutes per session: Not on file  . Stress: Not on file  Relationships  . Social connections:    Talks on phone: Not on file    Gets together: Not on file    Attends religious service: Not on file    Active member of club or organization: Not on file    Attends meetings of clubs or organizations: Not on file    Relationship status: Not on file  Other Topics Concern  . Not on file  Social History Narrative  . Not on file   Additional Social History: He lives alone. He has a dog. 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 He denies alcohol use. He denies marijuana use although UDS is positive for marijuana. He reports heroin use.     Allergies:  No Known Allergies  Labs:  Results for orders placed or performed during the hospital encounter of 03/18/19 (from the past 48 hour(s))  CBC with Differential/Platelet     Status: Abnormal   Collection Time: 03/20/19  2:46 AM  Result Value Ref Range   WBC 6.2 4.0 - 10.5 K/uL   RBC 3.83 (L) 4.22 - 5.81 MIL/uL   Hemoglobin 11.2 (L) 13.0 - 17.0 g/dL   HCT 96.2 (L) 95.2 - 84.1 %   MCV 89.6 80.0 - 100.0 fL   MCH 29.2 26.0 - 34.0 pg   MCHC 32.7 30.0 - 36.0 g/dL   RDW 32.4 40.1 - 02.7 %   Platelets 288 150 - 400 K/uL   nRBC 0.0 0.0 - 0.2 %   Neutrophils Relative % 69 %   Neutro Abs 4.4 1.7 - 7.7 K/uL   Lymphocytes Relative 17 %   Lymphs Abs 1.0 0.7 - 4.0 K/uL   Monocytes Relative 11 %    Monocytes Absolute 0.7 0.1 - 1.0 K/uL   Eosinophils Relative 2 %   Eosinophils Absolute 0.1 0.0 - 0.5 K/uL   Basophils Relative 0 %   Basophils Absolute 0.0 0.0 - 0.1 K/uL   Immature Granulocytes 1 %   Abs Immature Granulocytes 0.03 0.00 - 0.07 K/uL    Comment: Performed at [facility], [address] [place], [place] [address]  Comprehensive metabolic panel     Status: Abnormal   Collection Time:  03/20/19  2:46 AM  Result Value Ref Range   Sodium 136 135 - 145 mmol/L   Potassium 3.6 3.5 - 5.1 mmol/L   Chloride 103 98 - 111 mmol/L   CO2 24 22 - 32 mmol/L   Glucose, Bld 95 70 - 99 mg/dL   BUN 22 (H) 6 - 20 mg/dL   Creatinine, Ser 2.35 0.61 - 1.24 mg/dL   Calcium 8.7 (L) 8.9 - 10.3 mg/dL   Total Protein 6.8 6.5 - 8.1 g/dL   Albumin 3.2 (L) 3.5 - 5.0 g/dL   AST 22 15 - 41 U/L   ALT 15 0 - 44 U/L   Alkaline Phosphatase 44 38 - 126 U/L   Total Bilirubin 0.6 0.3 - 1.2 mg/dL   GFR calc non Af Amer >60 >60 mL/min   GFR calc Af Amer >60 >60 mL/min   Anion gap 9 5 - 15    Comment: Performed at [facility], [address] [place], [place] [address]  CK     Status: None   Collection Time: 03/20/19  2:46 AM  Result Value Ref Range   Total CK 84 49 - 397 U/L    Comment: Performed at [facility], [address] [place], [place] [address]  Magnesium     Status: None   Collection Time: 03/20/19  2:46 AM  Result Value Ref Range   Magnesium 1.9 1.7 - 2.4 mg/dL    Comment: Performed at [facility], [address], [place], [place] [address]  CBC with Differential/Platelet     Status: Abnormal   Collection Time: 03/21/19  7:36 AM  Result Value Ref Range   WBC 7.0 4.0 - 10.5 K/uL   RBC 4.11 (L) 4.22 - 5.81 MIL/uL   Hemoglobin 12.2 (L) 13.0 - 17.0 g/dL   HCT 76.2 (L) 83.1 - 51.7 %   MCV 89.5 80.0 - 100.0 fL   MCH 29.7 26.0 - 34.0 pg   MCHC 33.2 30.0 - 36.0 g/dL   RDW 61.6 07.3 - 71.0 %   Platelets 352 150 -  400 K/uL   nRBC 0.0 0.0 - 0.2 %   Neutrophils Relative % 77 %   Neutro Abs 5.4 1.7 - 7.7 K/uL   Lymphocytes Relative 13 %   Lymphs Abs 0.9 0.7 - 4.0 K/uL   Monocytes Relative 9 %   Monocytes Absolute 0.7 0.1 - 1.0 K/uL   Eosinophils Relative 0 %   Eosinophils Absolute 0.0 0.0 - 0.5 K/uL   Basophils Relative 0 %   Basophils Absolute 0.0 0.0 - 0.1 K/uL   Immature Granulocytes 1 %   Abs Immature Granulocytes 0.05 0.00 - 0.07 K/uL    Comment: Performed at [facility], [address] [place], [place] [address]  Comprehensive metabolic panel     Status: Abnormal   Collection Time: 03/21/19  7:36 AM  Result Value Ref Range   Sodium 139 135 - 145 mmol/L   Potassium 3.3 (L) 3.5 - 5.1 mmol/L   Chloride 108 98 - 111 mmol/L   CO2 20 (L) 22 - 32 mmol/L   Glucose, Bld 111 (H) 70 - 99 mg/dL   BUN 24 (H) 6 - 20 mg/dL   Creatinine, Ser 8.54 0.61 - 1.24 mg/dL   Calcium 9.0 8.9 - 62.7 mg/dL   Total Protein 7.4 6.5 - 8.1 g/dL   Albumin 3.4 (L) 3.5 - 5.0 g/dL   AST 24 15 - 41  U/L   ALT 15 0 - 44 U/L   Alkaline Phosphatase 44 38 - 126 U/L   Total Bilirubin 0.5 0.3 - 1.2 mg/dL   GFR calc non Af Amer >60 >60 mL/min   GFR calc Af Amer >60 >60 mL/min   Anion gap 11 5 - 15    Comment: Performed at [facility], [address] [place], [place] [address]  CK     Status: None   Collection Time: 03/21/19  7:36 AM  Result Value Ref Range   Total CK 55 49 - 397 U/L    Comment: Performed at [facility], [address] [place], [place] [address]  Magnesium     Status: None   Collection Time: 03/21/19  7:36 AM  Result Value Ref Range   Magnesium 2.3 1.7 - 2.4 mg/dL    Comment: Performed at [facility], [address], [place], [place] [address]    Current Facility-Administered Medications  Medication Dose Route Frequency Provider Last Rate Last Dose  . 0.9 %  sodium chloride infusion   Intravenous PRN Jonah Blue, MD    Stopped at 03/20/19 1742  . acetaminophen (TYLENOL) tablet 650 mg  650 mg Oral Q6H PRN Jonah Blue, MD   650 mg at 03/19/19 1054  . amoxicillin-clavulanate (AUGMENTIN) 875-125 MG per tablet 1 tablet  1 tablet Oral Q12H Osvaldo Shipper, MD      . bisacodyl (DULCOLAX) EC tablet 5 mg  5 mg Oral Daily PRN Jonah Blue, MD      . docusate sodium (COLACE) capsule 100 mg  100 mg Oral BID Jonah Blue, MD   Stopped at 03/20/19 2200  . enoxaparin (LOVENOX) injection 40 mg  40 mg Subcutaneous Q24H Jonah Blue, MD   40 mg at 03/20/19 2130  . folic acid (FOLVITE) tablet 1 mg  1 mg Oral Daily Karl Ito, MD   1 mg at 03/21/19 2725  . multivitamin with minerals tablet 1 tablet  1 tablet Oral Daily Karl Ito, MD   1 tablet at 03/21/19 [phone]  . ondansetron (ZOFRAN) tablet 4 mg  4 mg Oral Q6H PRN Jonah Blue, MD       Or  . ondansetron [facility]) injection 4 mg  4 mg Intravenous Q6H PRN Jonah Blue, MD      . polyethylene glycol (MIRALAX / GLYCOLAX) packet 17 g  17 g Oral Daily PRN Jonah Blue, MD      . potassium chloride SA (K-DUR,KLOR-CON) CR tablet 40 mEq  40 mEq Oral Once Osvaldo Shipper, MD      . thiamine (VITAMIN B-1) tablet 100 mg  100 mg Oral Daily Karl Ito, MD   100 mg at 03/21/19 4034  . zolpidem (AMBIEN) tablet 5 mg  5 mg Oral QHS PRN Dow Adolph N, DO   5 mg at 03/20/19 2131    Musculoskeletal: Strength & Muscle Tone: within normal limits Gait & Station: normal Patient leans: N/A  Psychiatric Specialty Exam: Physical Exam  Nursing note and vitals reviewed. Constitutional: He is oriented to person, place, and time. He appears well-developed and well-nourished.  HENT:  Head: Normocephalic and atraumatic.  Neck: Normal range of motion.  Respiratory: Effort normal.  Musculoskeletal: Normal range of motion.  Neurological: He is alert and oriented to person, place, and time.    Review of Systems  Cardiovascular: Negative for chest pain.   Gastrointestinal: Negative for abdominal pain, constipation, diarrhea, nausea and vomiting.  Psychiatric/Behavioral: Negative for suicidal ideas.  All other systems reviewed and are negative.   Blood pressure (!) 109/45, pulse (!) 57, temperature 98.8 F (37.1 C), temperature source Oral, resp. rate 18, height 5\' 11"  (1.803 m), weight 79.4 kg, SpO2 100 %.Body mass index is 24.41 kg/m.  General Appearance: Fairly Groomed, young, Caucasian male, wearing shorts with a bare chest and multiple body tattoos who is sitting in bed. NAD.   Eye Contact:  Good  Speech:  Clear and Coherent and Normal Rate  Volume:  Normal  Mood:  Euthymic  Affect:  Constricted  Thought Process:  Goal Directed, Linear and Descriptions of Associations: Intact  Orientation:  Full (Time, Place, and Person)  Thought Content:  Logical  Suicidal Thoughts:  No  Homicidal Thoughts:  No  Memory:  Immediate;   Good Recent;   Good Remote;   Good  Judgement:  Poor  Insight:  Fair  Psychomotor Activity:  Normal  Concentration:  Concentration: Good and Attention Span: Good  Recall:  Good  Fund of Knowledge:  Good  Language:  Good  Akathisia:  No  Handed:  Right  AIMS (if indicated):   N/A  Assets:  Communication Skills Desire for Improvement Financial Resources/Insurance Housing Physical Health Resilience Social Support  ADL's:  Intact  Cognition:  WNL  Sleep:   Okay    Assessment:  Mark Strickland is a 28 y.o. male who was admitted with heroin overdose complicated by acute hypoxic respiratory failure with bilateral infiltrates. Patient denies SI, HI or AVH and appears to minimize his substance use. He denies an intentional drug overdose. His mother was contacted for collateral. She reports that he has been depressed and intentionally overdosed on heroin due to feeling overwhelmed by suicidal thoughts. He has a prior history of suicide attempt by heroin overdose and likely did not receive psychiatric treatment  (per chart review he has not seen psychiatry in the past). Patient warrants inpatient psychiatric hospitalization for stabilization and treatment.   Treatment Plan Summary: -Patient warrants inpatient psychiatric hospitalization given high risk of harm to self. -Patient will need a bedside sitter.  -Will defer medication management to inpatient psychiatric team since patient continues to deny active mood symptoms.  -EKG reviewed and QTc 461 on 3/30. Please closely monitor when starting or increasing QTc prolonging agents.  -Please pursue involuntary commitment if patient refuses voluntary psychiatric hospitalization or attempts to leave the hospital.  -Will sign off on patient at this time. Please consult psychiatry again as needed.    Disposition: Recommend psychiatric Inpatient admission when medically cleared.  Cherly BeachJacqueline J Durand Wittmeyer, DO 03/21/2019 11:51 AM

## 2019-03-21 NOTE — Progress Notes (Addendum)
I have spoken with pt's PCP, nursing staff, reviewed patient's chart. They have a clear alternative diagnosis and COVID precautions are being d/c.  Afeb since 3-31. CXR improving (presumed aspiration due to o/d) Spoke with lab- COVID test (-)

## 2019-03-21 NOTE — Progress Notes (Signed)
Pt with questions about why he is here(multiple times), explained to pt about his condition and covid test, that the MD is waiting for results. Pt is noncompliant with isolation opening his door and stepping out.He doesn't seem to know about the current state of affairs with Covid. He said that he didn't know anything about it. He did say that he is asymptomatic. Will not leave his tele on. wants to go to [employer] to buy a Advertising account planner.

## 2019-03-22 ENCOUNTER — Inpatient Hospital Stay (HOSPITAL_COMMUNITY)
Admission: AD | Admit: 2019-03-22 | Discharge: 2019-03-25 | DRG: 885 | Disposition: A | Discharge status: Home / Self Care | Payer: No Typology Code available for payment source | Source: Intra-hospital | Attending: Psychiatry | Admitting: Psychiatry

## 2019-03-22 ENCOUNTER — Other Ambulatory Visit: Payer: Self-pay

## 2019-03-22 ENCOUNTER — Encounter (HOSPITAL_COMMUNITY): Payer: Self-pay

## 2019-03-22 DIAGNOSIS — F17210 Nicotine dependence, cigarettes, uncomplicated: Secondary | ICD-10-CM | POA: Diagnosis present

## 2019-03-22 DIAGNOSIS — F332 Major depressive disorder, recurrent severe without psychotic features: Secondary | ICD-10-CM | POA: Diagnosis present

## 2019-03-22 DIAGNOSIS — G931 Anoxic brain damage, not elsewhere classified: Secondary | ICD-10-CM | POA: Diagnosis present

## 2019-03-22 DIAGNOSIS — Z811 Family history of alcohol abuse and dependence: Secondary | ICD-10-CM

## 2019-03-22 DIAGNOSIS — R413 Other amnesia: Secondary | ICD-10-CM | POA: Diagnosis present

## 2019-03-22 DIAGNOSIS — T401X1A Poisoning by heroin, accidental (unintentional), initial encounter: Secondary | ICD-10-CM | POA: Diagnosis not present

## 2019-03-22 DIAGNOSIS — J301 Allergic rhinitis due to pollen: Secondary | ICD-10-CM | POA: Diagnosis present

## 2019-03-22 DIAGNOSIS — F419 Anxiety disorder, unspecified: Secondary | ICD-10-CM | POA: Diagnosis present

## 2019-03-22 DIAGNOSIS — F1123 Opioid dependence with withdrawal: Secondary | ICD-10-CM | POA: Diagnosis present

## 2019-03-22 DIAGNOSIS — T1491XA Suicide attempt, initial encounter: Secondary | ICD-10-CM

## 2019-03-22 DIAGNOSIS — F1120 Opioid dependence, uncomplicated: Secondary | ICD-10-CM | POA: Diagnosis not present

## 2019-03-22 HISTORY — DX: Depression, unspecified: F32.A

## 2019-03-22 HISTORY — DX: Major depressive disorder, single episode, unspecified: F32.9

## 2019-03-22 LAB — BASIC METABOLIC PANEL
Anion gap: 8 (ref 5–15)
BUN: 28 mg/dL — ABNORMAL HIGH (ref 6–20)
CO2: 22 mmol/L (ref 22–32)
Calcium: 9.1 mg/dL (ref 8.9–10.3)
Chloride: 111 mmol/L (ref 98–111)
Creatinine, Ser: 0.97 mg/dL (ref 0.61–1.24)
GFR calc Af Amer: >60 mL/min (ref >60)
GFR calc non Af Amer: >60 mL/min (ref >60)
Glucose, Bld: 102 mg/dL — ABNORMAL HIGH (ref 70–99)
Potassium: 3.9 mmol/L (ref 3.5–5.1)
Sodium: 141 mmol/L (ref 135–145)

## 2019-03-22 MED ORDER — PRENATAL MULTIVITAMIN CH
1.0000 | ORAL_TABLET | Freq: Every day | ORAL | Status: DC
  Administered 2019-03-23 – 2019-03-25 (×3): 1 via ORAL
  Filled 2019-03-22 (×2): qty 1
  Filled 2019-03-22: qty 14
  Filled 2019-03-22: qty 1

## 2019-03-22 MED ORDER — MAGNESIUM HYDROXIDE 400 MG/5ML PO SUSP: 30.0000 mL | Freq: Every day | ORAL | Status: DC | PRN

## 2019-03-22 MED ORDER — ACETAMINOPHEN 325 MG PO TABS: 650.0000 mg | ORAL_TABLET | Freq: Four times a day (QID) | ORAL | Status: DC | PRN

## 2019-03-22 MED ORDER — MIRTAZAPINE 15 MG PO TABS
15.0000 mg | ORAL_TABLET | Freq: Every day | ORAL | Status: DC
  Administered 2019-03-22 – 2019-03-24 (×3): 15 mg via ORAL
  Filled 2019-03-22: qty 1
  Filled 2019-03-22: qty 14
  Filled 2019-03-22 (×4): qty 1

## 2019-03-22 MED ORDER — ACETAMINOPHEN 325 MG PO TABS
650.0000 mg | ORAL_TABLET | Freq: Four times a day (QID) | ORAL | Status: DC | PRN
  Administered 2019-03-22 – 2019-03-23 (×3): 650 mg via ORAL
  Filled 2019-03-22 (×3): qty 2

## 2019-03-22 MED ORDER — HYDROXYZINE HCL 25 MG PO TABS
25.0000 mg | ORAL_TABLET | Freq: Three times a day (TID) | ORAL | Status: DC | PRN
  Administered 2019-03-22 – 2019-03-24 (×3): 25 mg via ORAL
  Filled 2019-03-22 (×2): qty 1
  Filled 2019-03-22: qty 10
  Filled 2019-03-22: qty 1

## 2019-03-22 MED ORDER — TRAZODONE HCL 50 MG PO TABS: 50.0000 mg | ORAL_TABLET | Freq: Every evening | ORAL | Status: DC | PRN

## 2019-03-22 MED ORDER — MEMANTINE HCL 5 MG PO TABS: 5.0000 mg | ORAL_TABLET | Freq: Two times a day (BID) | ORAL | Status: DC

## 2019-03-22 MED ORDER — AMOXICILLIN-POT CLAVULANATE 875-125 MG PO TABS: 1.0000 | ORAL_TABLET | Freq: Two times a day (BID) | ORAL | Status: DC

## 2019-03-22 MED ORDER — ALUM & MAG HYDROXIDE-SIMETH 200-200-20 MG/5ML PO SUSP: 30.0000 mL | ORAL | Status: DC | PRN

## 2019-03-22 MED ORDER — ALUM & MAG HYDROXIDE-SIMETH 200-200-20 MG/5ML PO SUSP
30.0000 mL | ORAL | Status: DC | PRN
  Administered 2019-03-23: 11:00:00 30 mL via ORAL
  Filled 2019-03-22: qty 30

## 2019-03-22 MED ORDER — CLONIDINE HCL 0.1 MG PO TABS
0.1000 mg | ORAL_TABLET | ORAL | Status: DC | PRN
  Administered 2019-03-22: 23:00:00 0.1 mg via ORAL
  Filled 2019-03-22: qty 1

## 2019-03-22 MED ORDER — MEMANTINE HCL 5 MG PO TABS
10.0000 mg | ORAL_TABLET | Freq: Two times a day (BID) | ORAL | Status: DC
  Administered 2019-03-22 – 2019-03-25 (×6): 10 mg via ORAL
  Filled 2019-03-22 (×3): qty 1
  Filled 2019-03-22 (×3): qty 2
  Filled 2019-03-22: qty 1
  Filled 2019-03-22: qty 56
  Filled 2019-03-22 (×2): qty 1
  Filled 2019-03-22: qty 2
  Filled 2019-03-22: qty 56

## 2019-03-22 MED ORDER — LORATADINE 10 MG PO TABS
10.0000 mg | ORAL_TABLET | Freq: Every day | ORAL | Status: DC
  Administered 2019-03-23 – 2019-03-25 (×3): 10 mg via ORAL
  Filled 2019-03-22 (×5): qty 1

## 2019-03-22 MED ORDER — ADULT MULTIVITAMIN W/MINERALS CH: 1.0000 | ORAL_TABLET | Freq: Every day | ORAL | Status: DC

## 2019-03-22 MED ORDER — ROPINIROLE HCL 0.25 MG PO TABS
0.2500 mg | ORAL_TABLET | Freq: Once | ORAL | Status: AC
  Administered 2019-03-22: 0.25 mg via ORAL
  Filled 2019-03-22 (×2): qty 1

## 2019-03-22 MED ORDER — LORATADINE 10 MG PO TABS
10.0000 mg | ORAL_TABLET | Freq: Every day | ORAL | Status: DC
  Administered 2019-03-22: 10 mg via ORAL
  Filled 2019-03-22 (×2): qty 1

## 2019-03-22 MED ORDER — OMEGA-3-ACID ETHYL ESTERS 1 G PO CAPS
1.0000 g | ORAL_CAPSULE | Freq: Two times a day (BID) | ORAL | Status: DC
  Administered 2019-03-22 – 2019-03-25 (×6): 1 g via ORAL
  Filled 2019-03-22 (×4): qty 1
  Filled 2019-03-22: qty 28
  Filled 2019-03-22 (×3): qty 1
  Filled 2019-03-22: qty 28
  Filled 2019-03-22: qty 1

## 2019-03-22 NOTE — [facility] Group Notes (Signed)
Adult Psychoeducational Group Note  Date:  03/22/2019 Time:  8:38 PM  Group Topic/Focus:  Wrap-Up Group:   The focus of this group is to help patients review their daily goal of treatment and discuss progress on daily workbooks.  Participation Level:  Active  Participation Quality:  Appropriate  Affect:  Appropriate  Cognitive:  Appropriate  Insight: Appropriate  Engagement in Group:  Engaged  Modes of Intervention:  Discussion and Education  Additional Comments:  Pt attended and participated in wrap up group this evening. Pt rated their day a 10/10. Pt completed their goal, which was to recover their health and wellbeing. Pt has no questions or concerns at the moment.   Chrisandra Netters 03/22/2019, 8:38 PM

## 2019-03-22 NOTE — Progress Notes (Signed)
Patient ID: Mark Strickland, male   DOB: 01-05-1991, 28 y.o.   MRN: [id number]  Patient presents to [facility] on a voluntary basis due to an unintentional overdose. Patient was asked if he does illegal drugs, he said "xanax sometimes. The drugs were just in a little bag and I assumed It was xanax but I guess not." patient minimizes drug use and does not think it's an issue. He was on the phone with his friend who eventually realized he was unresponsive. Patient was in the ICU for a few days, then telemetry. He was also tested for Covid which came back negative. Patient wants to work on getting on a better medication. Denies SI HI AVH. Endorses physical pain in his chest/ribs due to respiratory issues in the ICU.  Skin search was performed and found unremarkable. Patient was oriented to the unit. Safety is maintained with 15 minute checks.

## 2019-03-22 NOTE — H&P (Signed)
Psychiatric Admission Assessment Adult  Patient Identification: Mark Strickland MRN:  [id number] Date of Evaluation:  03/22/2019 Chief Complaint:  MDD Principal Diagnosis: <principal problem not specified> Diagnosis:  Active Problems:   Opiate dependence (HCC)  History of Present Illness:   This is the first admission here for Mark Strickland is a 28 year old patient who presented after an overdose he gives variable accounts of the event but the bottom line is he was snorting heroin, drug screen also showed cocaine and benzodiazepines.  Given his story varies, at the present time, he tells me it was not a suicide attempt but rather he was just going through his things at home, found "a bag of Xanax" he began snorting it.  He surprised to learn that it was not entirely Xanax- Earlier accounts involved him admitting injecting heroin in his feet vessels He required Narcan. In the emergency department was noted to have acute respiratory failure/bilateral infiltrates on chest x-ray was hypoxic with sats 91 to 92% and he tested negative for COVID  Drug screen positive for benzodiazepines, opiates and cocaine, his mother believes that this was in fact a suicide attempt according to notes. Since that time his initial ER presentation, he reports short-term memory deficits he can recall 3 of 3 immediately but none of 3 after 30 seconds and several routine questions.  He is now reporting no thoughts of harming himself or others states he is not particular depressed at the present time. He denies the need for detox states he will not have withdrawal symptoms because he is "been in recovery" until he found this bag of leftover drugs.   Associated Signs/Symptoms: Depression Symptoms:  depressed mood, (Hypo) Manic Symptoms:  Impulsivity, Anxiety Symptoms:  N/A Psychotic Symptoms:  N/A PTSD Symptoms: NA Total Time spent with patient: 45 minutes  Past Psychiatric History: Denies prior admissions  Is the  patient at risk to self? Yes.    Has the patient been a risk to self in the past 6 months? No.  Has the patient been a risk to self within the distant past? No.  Is the patient a risk to others? No.  Has the patient been a risk to others in the past 6 months? No.  Has the patient been a risk to others within the distant past? No.   Prior Inpatient Therapy:   Prior Outpatient Therapy:    Alcohol Screening: Patient refused Alcohol Screening Tool: Yes 1. How often do you have a drink containing alcohol?: Never 2. How many drinks containing alcohol do you have on a typical day when you are drinking?: 1 or 2 3. How often do you have six or more drinks on one occasion?: Never AUDIT-C Score: 0 4. How often during the last year have you found that you were not able to stop drinking once you had started?: Never 5. How often during the last year have you failed to do what was normally expected from you becasue of drinking?: Never 6. How often during the last year have you needed a first drink in the morning to get yourself going after a heavy drinking session?: Never 7. How often during the last year have you had a feeling of guilt of remorse after drinking?: Never 8. How often during the last year have you been unable to remember what happened the night before because you had been drinking?: Never 9. Have you or someone else been injured as a result of your drinking?: No 10. Has a relative or friend or a  doctor or another health worker been concerned about your drinking or suggested you cut down?: No Alcohol Use Disorder Identification Test Final Score (AUDIT): 0 Substance Abuse History in the last 12 months:  Yes.   Consequences of Substance Abuse: Medical Consequences:  Obviously overdosed Previous Psychotropic Medications: No  Psychological Evaluations: No  Past Medical History:  Past Medical History:  Diagnosis Date  . Depression   . Heroin abuse [facility])     Past Surgical History:   Procedure Laterality Date  . NO PAST SURGERIES     Family History: History reviewed. No pertinent family history. Family Psychiatric  History: ukn Tobacco Screening: Have you used any form of tobacco in the last 30 days? (Cigarettes, Smokeless Tobacco, Cigars, and/or Pipes): Yes Tobacco use, Select all that apply: 4 or less cigarettes per day Are you interested in Tobacco Cessation Medications?: Yes, will notify MD for an order Counseled patient on smoking cessation including recognizing danger situations, developing coping skills and basic information about quitting provided: Refused/Declined practical counseling Social History:  Social History   Substance and Sexual Activity  Alcohol Use Not Currently     Social History   Substance and Sexual Activity  Drug Use Yes  . Types: IV, Heroin, Benzodiazepines    Additional Social History: Marital status: Single Are you sexually active?: No What is your sexual orientation?: Straight Has your sexual activity been affected by drugs, alcohol, medication, or emotional stress?: Denies Does patient have children?: No                         Allergies:  No Known Allergies Lab Results:  Results for orders placed or performed during the hospital encounter of 03/18/19 (from the past 48 hour(s))  CBC with Differential/Platelet     Status: Abnormal   Collection Time: 03/21/19  7:36 AM  Result Value Ref Range   WBC 7.0 4.0 - 10.5 K/uL   RBC 4.11 (L) 4.22 - 5.81 MIL/uL   Hemoglobin 12.2 (L) 13.0 - 17.0 g/dL   HCT 16.6 (L) 06.3 - 01.6 %   MCV 89.5 80.0 - 100.0 fL   MCH 29.7 26.0 - 34.0 pg   MCHC 33.2 30.0 - 36.0 g/dL   RDW 01.0 93.2 - 35.5 %   Platelets 352 150 - 400 K/uL   nRBC 0.0 0.0 - 0.2 %   Neutrophils Relative % 77 %   Neutro Abs 5.4 1.7 - 7.7 K/uL   Lymphocytes Relative 13 %   Lymphs Abs 0.9 0.7 - 4.0 K/uL   Monocytes Relative 9 %   Monocytes Absolute 0.7 0.1 - 1.0 K/uL   Eosinophils Relative 0 %   Eosinophils  Absolute 0.0 0.0 - 0.5 K/uL   Basophils Relative 0 %   Basophils Absolute 0.0 0.0 - 0.1 K/uL   Immature Granulocytes 1 %   Abs Immature Granulocytes 0.05 0.00 - 0.07 K/uL    Comment: Performed at [facility], [address], [place], [place] [address]  Comprehensive metabolic panel     Status: Abnormal   Collection Time: 03/21/19  7:36 AM  Result Value Ref Range   Sodium 139 135 - 145 mmol/L   Potassium 3.3 (L) 3.5 - 5.1 mmol/L   Chloride 108 98 - 111 mmol/L   CO2 20 (L) 22 - 32 mmol/L   Glucose, Bld 111 (H) 70 - 99 mg/dL   BUN 24 (H) 6 - 20 mg/dL   Creatinine, Ser 2.54 0.61 -  1.24 mg/dL   Calcium 9.0 8.9 - 60.0 mg/dL   Total Protein 7.4 6.5 - 8.1 g/dL   Albumin 3.4 (L) 3.5 - 5.0 g/dL   AST 24 15 - 41 U/L   ALT 15 0 - 44 U/L   Alkaline Phosphatase 44 38 - 126 U/L   Total Bilirubin 0.5 0.3 - 1.2 mg/dL   GFR calc non Af Amer >60 >60 mL/min   GFR calc Af Amer >60 >60 mL/min   Anion gap 11 5 - 15    Comment: Performed at [facility], [address] [place], [place] [address]  CK     Status: None   Collection Time: 03/21/19  7:36 AM  Result Value Ref Range   Total CK 55 49 - 397 U/L    Comment: Performed at [facility], [address], [place], [place] [address]  Magnesium     Status: None   Collection Time: 03/21/19  7:36 AM  Result Value Ref Range   Magnesium 2.3 1.7 - 2.4 mg/dL    Comment: Performed at [facility], [address], [place], [place] [address]  Basic metabolic panel     Status: Abnormal   Collection Time: 03/22/19  3:38 AM  Result Value Ref Range   Sodium 141 135 - 145 mmol/L   Potassium 3.9 3.5 - 5.1 mmol/L   Chloride 111 98 - 111 mmol/L   CO2 22 22 - 32 mmol/L   Glucose, Bld 102 (H) 70 - 99 mg/dL   BUN 28 (H) 6 - 20 mg/dL   Creatinine, Ser 0.23 0.61 - 1.24 mg/dL   Calcium 9.1 8.9 - 34.3 mg/dL   GFR calc non Af Amer >60 >60 mL/min   GFR calc Af Amer >60 >60 mL/min   Anion  gap 8 5 - 15    Comment: Performed at [facility], [address], [place], [place] [address]    Blood Alcohol level:  Lab Results  Component Value Date   ETH <10 03/18/2019    Metabolic Disorder Labs:  No results found for: HGBA1C, MPG No results found for: PROLACTIN No results found for: CHOL, TRIG, HDL, CHOLHDL, VLDL, LDLCALC  Current Medications: Current Facility-Administered Medications  Medication Dose Route Frequency Provider Last Rate Last Dose  . acetaminophen (TYLENOL) tablet 650 mg  650 mg Oral Q6H PRN Malvin Johns, MD      . alum & mag hydroxide-simeth (MAALOX/MYLANTA) 200-200-20 MG/5ML suspension 30 mL  30 mL Oral Q4H PRN Malvin Johns, MD      . cloNIDine (CATAPRES) tablet 0.1 mg  0.1 mg Oral Q4H PRN Malvin Johns, MD      . magnesium hydroxide (MILK OF MAGNESIA) suspension 30 mL  30 mL Oral Daily PRN Malvin Johns, MD      . memantine [facility]) tablet 10 mg  10 mg Oral BID Malvin Johns, MD      . omega-3 acid ethyl esters (LOVAZA) capsule 1 g  1 g Oral BID Malvin Johns, MD      . Melene Muller ON 03/23/2019] prenatal multivitamin tablet 1 tablet  1 tablet Oral Q1200 Malvin Johns, MD       PTA Medications: Medications Prior to Admission  Medication Sig Dispense Refill Last Dose  . amoxicillin-clavulanate (AUGMENTIN) 875-125 MG tablet Take 1 tablet by mouth every 12 (twelve) hours for 3 days.       Musculoskeletal: Strength & Muscle Tone: within normal limits Gait & Station: normal  Patient leans: N/A  Psychiatric Specialty Exam: Physical Exam  ROS  Blood pressure 114/76, pulse (!) 101, temperature 98.4 F (36.9 C), temperature source Oral, resp. rate 18, height  (1.803 m), weight 69.4 kg, SpO2 100 %.Body mass index is 21.34 kg/m.  General Appearance: Casual  Eye Contact:  Good  Speech:  Clear and Coherent  Volume:  Normal  Mood:  Anxious and Dysphoric  Affect:  Appropriate  Thought Process:  Coherent  Orientation:  Full (Time, Place, and  Person)  Thought Content:  Logical  Suicidal Thoughts:  No  Homicidal Thoughts:  No  Memory:  Immediate;   Poor  Judgement:  Poor  Insight:  Shallow  Psychomotor Activity:  Decreased  Concentration:  Concentration: Poor  Recall:  Poor  Fund of Knowledge:  Poor  Language:  Good  Akathisia:  Negative  Handed:  Right  AIMS (if indicated):     Assets:  Leisure Time Physical Health Resilience  ADL's:  Intact  Cognition:  WNL  Sleep:     On testing longer-term memory states he cannot recall phone numbers he used to no  Treatment Plan Summary: Daily contact with patient to assess and evaluate symptoms and progress in treatment and Medication management  Observation Level/Precautions:  15 minute checks  Laboratory:  UDS  Psychotherapy: Rehab based  Medications: Give him the standard treatment for folks who might of had anoxic injury and suffering memory deficits basically dementia type therapy/watch for withdrawal  Consultations: None necessary at present  Discharge Concerns: Long-term sobriety  Estimated LOS: 5 days  Other: Axis I opiate dependence and overdose/polysubstance abuse/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 Axis II deferred Axis III possible anoxic injury from overdose based on short-term and long-term memory deficits   Physician Treatment Plan for Primary Diagnosis: <principal problem not specified> Long Term Goal(s): Improvement in symptoms so as ready for discharge  Short Term Goals: Ability to verbalize feelings will improve  Physician Treatment Plan for Secondary Diagnosis: Active Problems:   Opiate dependence (HCC)  Long Term Goal(s): Improvement in symptoms so as ready for discharge  Short Term Goals: Compliance with prescribed medications will improve  I certify that inpatient services furnished can reasonably be expected to improve the patient's condition.    Malvin Johns, MD 4/3/20203:09 PM

## 2019-03-22 NOTE — BHH Group Notes (Signed)
LCSW Group Therapy Notes 03/22/2019 1:53 PM  Type of Therapy and Topic: Group Therapy: Overcoming Obstacles  Participation Level: Active  Description of Group:  In this group patients will be encouraged to explore what they see as obstacles to their own wellness and recovery. They will be guided to discuss their thoughts, feelings, and behaviors related to these obstacles. The group will process together ways to cope with barriers, with attention given to specific choices patients can make. Each patient will be challenged to identify changes they are motivated to make in order to overcome their obstacles. This group will be process-oriented, with patients participating in exploration of their own experiences as well as giving and receiving support and challenge from other group members.  Therapeutic Goals: 1. Patient will identify personal and current obstacles as they relate to admission. 2. Patient will identify barriers that currently interfere with their wellness or overcoming obstacles.  3. Patient will identify feelings, thought process and behaviors related to these barriers. 4. Patient will identify two changes they are willing to make to overcome these obstacles:   Summary of Patient Progress Patient identified an obstacle related to admission is seeking external validation: words of affirmation from others, driving a nice car, etc. He identified that a barrier he would like to overcome is working toward finding things that make him feel happy. He shared that the feelings related to this are personal and politely declined to elaborate in a group setting.   Therapeutic Modalities:  Cognitive Behavioral Therapy Solution Focused Therapy Motivational Interviewing Relapse Prevention Therapy  Enid Cutter, MSW, [facility] 03/22/2019 1:53 PM
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
## 2019-03-22 NOTE — Progress Notes (Signed)
Pt requests medication for sleep.  He reports he has taken mirtazapine "for years."  He is unsure of dosage.  Writer offered PRN Trazodone and pt reports he has had adverse effects from Trazodone in the past.  He reports having "restless legs" after taking Trazodone previously.  On-site provider notified and new medication order placed.  Medication administered per order.

## 2019-03-22 NOTE — Progress Notes (Signed)
Pt received PRN medication for withdrawal earlier tonight.  PO fluids encouraged.  Pt still complaining of not being able to sleep.  He attributes this to "restless legs."  Pt has been shaking his legs when checks are being done.  He has moved to the other bed in his room as well.  He reports "I'm trying not to move them, but it feels like I have to."  On-site provider notified and Requip 0.25 mg POX1 was ordered and administered.  Will continue to monitor and assess.

## 2019-03-22 NOTE — Discharge Summary (Signed)
Physician Discharge Summary  Mark Strickland ZOX:[id number] DOB: 01/26/1991 DOA: 03/18/2019  PCP: Patient, No Pcp Per  Admit date: 03/18/2019 Discharge date: 03/22/2019  Admitted From: Home Disposition: Behavioral health hospital  Recommendations for Outpatient Follow-up:  1. Please obtain BMP/CBC in one week 2. Please follow up on the following pending results: None  Home Health: None Equipment/Devices: None  Discharge Condition: Stable CODE STATUS: Full code Diet recommendation: Regular diet   Brief/Interim Summary:  Admission HPI written by Chilton Greathouse, MD   Brief History  28 year old with substance abuse presenting with resume drug overdose.  Patient became unresponsive while talking with a friend over phone.  Given 1 round of Narcan by first responders with some improvement Given another round of Narcan in ED.  Continues to be somnolent.  Reports fever, dyspnea at home.  Cannot elicit any history of travel or contacts. Hospitalist called initially however he had rapid progressive increase in oxygen requirements.  No nonrebreather Hence pulmonary was consulted.   Hospital course:  Acute respiratory failure with hypoxia Secondary to opiate overdose in addition to aspiration pneumonia. Weaned off of oxygen.  Aspiration pneumonia Initially treated with Ceftriaxone and Azithromycin. Transitioned to Augmentin prior to discharge.  Heroin overdose Stable. Patient required Narcan on presentation. Family concern of suicidal intent. He was evaluated by psychiatry who recommended inpatient psychiatry admission.  Hypokalemia Resolved with supplementation  Normocytic anemia Mild. Unknown etiology. Outpatient follow-up   Discharge Diagnoses:  Principal Problem:   Suicide attempt [facility]) Active Problems:   Acute respiratory failure with hypoxia [facility])   Drug overdose    Discharge Instructions  Discharge Instructions    Call MD for:  difficulty breathing,  headache or visual disturbances   Complete by:  As directed    Call MD for:  temperature >100.4   Complete by:  As directed      Allergies as of 03/22/2019   No Known Allergies     Medication List    TAKE these medications   amoxicillin-clavulanate 875-125 MG tablet Commonly known as:  AUGMENTIN Take 1 tablet by mouth every 12 (twelve) hours for 3 days.       No Known Allergies  Consultations:  Critical care medicine  Psychiatry   Procedures/Studies: Dg Chest Port 1 View  Result Date: 03/21/2019 CLINICAL DATA:  Respiratory failure and hypoxia EXAM: PORTABLE CHEST 1 VIEW COMPARISON:  03/18/2019 FINDINGS: Cardiac shadow is mildly prominent but accentuated by the portable technique. The lungs are well aerated bilaterally. Previously seen infiltrative changes in both lungs have significantly improved when compared with the prior study. Some residual density is noted. No sizable effusion or pneumothorax is seen. IMPRESSION: Significant improved aeration bilaterally. Electronically Signed   By: Alcide Clever M.D.   On: 03/21/2019 09:42   Dg Chest Port 1 View  Result Date: 03/18/2019 CLINICAL DATA:  28 year old male with fever, found unresponsive. History of heroin use. EXAM: PORTABLE CHEST 1 VIEW COMPARISON:  None. FINDINGS: Low inspiratory volumes. Patchy airspace opacities noted in the right lower and mid lung. To a lesser extent, subtle patchy airspace opacities are also present peripherally in the left lung base. Cardiac and mediastinal contours are within normal limits. No evidence of pneumothorax. No acute osseous abnormality. IMPRESSION: Right greater than left patchy airspace opacities in a predominantly dependent distribution. Given the clinical history, aspiration pneumonitis is favored. However, viral pneumonia could have a similar appearance and distribution. Electronically Signed   By: Malachy Moan M.D.   On: 03/18/2019 14:46  Subjective: No issues today. Wants to  shower.  Discharge Exam: Vitals:   03/22/19 0718 03/22/19 1002  BP: (!) 121/56 119/67  Pulse: (!) 50 (!) 52  Resp: 18 16  Temp: 98.7 F (37.1 C) 98.9 F (37.2 C)  SpO2: 100% 100%   Vitals:   03/21/19 2106 03/22/19 0534 03/22/19 0718 03/22/19 1002  BP: 129/67 109/61 (!) 121/56 119/67  Pulse: (!) 56 (!) 51 (!) 50 (!) 52  Resp: 18 18 18 16   Temp: 99 F (37.2 C) 99 F (37.2 C) 98.7 F (37.1 C) 98.9 F (37.2 C)  TempSrc: Oral Oral Oral Oral  SpO2: 99% 99% 100% 100%  Weight:      Height:        General: Pt is alert, awake, not in acute distress Cardiovascular: RRR, S1/S2 +, no rubs, no gallops Respiratory: CTA bilaterally, no wheezing, no rhonchi Abdominal: Soft, NT, ND, bowel sounds + Extremities: no edema, no cyanosis    The results of significant diagnostics from this hospitalization (including imaging, microbiology, ancillary and laboratory) are listed below for reference.     Microbiology: Recent Results (from the past 240 hour(s))  Culture, blood (Routine X 2) w Reflex to ID Panel     Status: None (Preliminary result)   Collection Time: 03/18/19  5:05 PM  Result Value Ref Range Status   Specimen Description   Final    BLOOD ARM LEFT Performed at [facility], [address] [place], [place] [address]    Special Requests   Final    BOTTLES DRAWN AEROBIC AND ANAEROBIC Blood Culture results may not be optimal due to an excessive volume of blood received in culture bottles Performed at [facility], [address] [place], [place] [address]    Culture   Final    NO GROWTH 3 DAYS Performed at [facility], [address], [place], [place] [address]    Report Status PENDING  Incomplete  Culture, blood (Routine X 2) w Reflex to ID Panel     Status: None (Preliminary result)   Collection Time: 03/18/19  5:10 PM  Result Value Ref Range Status   Specimen Description   Final    BLOOD RIGHT ANTECUBITAL Performed at  [facility], [address], [place], [place] [address]    Special Requests   Final    BOTTLES DRAWN AEROBIC AND ANAEROBIC Blood Culture adequate volume Performed at [facility], [address], [place], [place] [address]    Culture   Final    NO GROWTH 3 DAYS Performed at [facility], [address], [place], [place] [address]    Report Status PENDING  Incomplete  Novel Coronavirus, NAA (hospital order; send-out to ref lab)     Status: None   Collection Time: 03/18/19  5:30 PM  Result Value Ref Range Status   SARS-CoV-2, NAA NOT DETECTED NOT DETECTED Final    Comment: Negative (Not Detected) results do not exclude infection caused by SARS CoV 2 and should not be used as the sole basis for treatment or other patient management decisions. Optimum specimen types and timing for peak viral levels during infections caused  by SARS CoV 2 have not been determined. Collection of multiple specimens (types and time points) from the same patient may be necessary to detect the virus. Improper specimen collection and handling, sequence variability underlying assay primers and or probes, or the presence of organisms in  quantities  less than the limit of detection of the assay may lead to false negative results. Positive and negative predictive values of testing are highly dependent on prevalence. False negative results are more likely when prevalence of disease is high. (NOTE) The expected result is Negative (Not Detected). The SARS CoV 2 test is intended for the presumptive qualitative  detection of nucleic acid from SARS CoV 2 in upper and lower  respir atory specimens. Testing methodology is real time RT PCR. Test results must be correlated with clinical presentation and  evaluated in the context of other laboratory and epidemiologic data.  Test performance can be affected because the epidemiology and  clinical spectrum of infection caused by SARS CoV  2 is not fully  known. For example, the optimum types of specimens to collect and  when during the course of infection these specimens are most likely  to contain detectable viral RNA may not be known. This test has not been Food and Drug Administration (FDA) cleared or  approved and has been authorized by FDA under an Emergency Use  Authorization (EUA). The test is only authorized for the duration of  the declaration that circumstances exist justifying the authorization  of emergency use of in vitro diagnostic tests for detection and or  diagnosis of SARS CoV 2 under Section 564(b)(1) of the Act, 21 U.S.C.  section 6168179228 3(b)(1), unless the authorization is terminated or   revoked sooner. [facility] is certified under the  Clinical Laboratory Improvement Amendments of 1988 (CLIA), 42 U.S.C.  section [phone], to perform high complexity tests. Performed at [facility] CLIA [id number] [address], [place], [place] [address] Laboratory Director: Turner Daniels, MD Performed at [facility], [address], [place], [place] [address]    Coronavirus Source NASOPHARYNGEAL  Final    Comment: Performed at [facility], [address], [place], [place] [address]  Respiratory Panel by PCR     Status: None   Collection Time: 03/18/19  5:30 PM  Result Value Ref Range Status   Adenovirus NOT DETECTED NOT DETECTED Final   Coronavirus 229E NOT DETECTED NOT DETECTED Final    Comment: (NOTE) The Coronavirus on the Respiratory Panel, DOES NOT test for the novel  Coronavirus (2019 nCoV)    Coronavirus HKU1 NOT DETECTED NOT DETECTED Final   Coronavirus NL63 NOT DETECTED NOT DETECTED Final   Coronavirus OC43 NOT DETECTED NOT DETECTED Final   Metapneumovirus NOT DETECTED NOT DETECTED Final   Rhinovirus / Enterovirus NOT DETECTED NOT DETECTED Final   Influenza A NOT DETECTED NOT DETECTED Final   Influenza B NOT DETECTED  NOT DETECTED Final   Parainfluenza Virus 1 NOT DETECTED NOT DETECTED Final   Parainfluenza Virus 2 NOT DETECTED NOT DETECTED Final   Parainfluenza Virus 3 NOT DETECTED NOT DETECTED Final   Parainfluenza Virus 4 NOT DETECTED NOT DETECTED Final   Respiratory Syncytial Virus NOT DETECTED NOT DETECTED Final   Bordetella pertussis NOT DETECTED NOT DETECTED Final   Chlamydophila pneumoniae NOT DETECTED NOT DETECTED Final   Mycoplasma pneumoniae NOT DETECTED NOT DETECTED Final    Comment: Performed at [facility], [address], [place], [place] [address]  MRSA PCR Screening     Status: None   Collection Time: 03/18/19  8:36 PM  Result Value Ref Range Status   MRSA by PCR NEGATIVE NEGATIVE Final    Comment:        The  GeneXpert MRSA Assay (FDA approved for NASAL specimens only), is one component of a comprehensive MRSA colonization surveillance program. It is not intended to diagnose MRSA infection nor to guide or monitor treatment for MRSA infections. Performed at [facility], [address] [place], [place] [address]      Labs: BNP (last 3 results) No results for input(s): BNP in the last 8760 hours. Basic Metabolic Panel: Recent Labs  Lab 03/18/19 1406 03/19/19 0256 03/20/19 0246 03/21/19 0736 03/22/19 0338  NA 140 137 136 139 141  K 3.4* 4.2 3.6 3.3* 3.9  CL 104 105 103 108 111  CO2 20* 22  GLUCOSE 116* 104* 95 111* 102*  BUN 27* 19 22* 24* 28*  CREATININE 1.09 0.94 0.85 0.91 0.97  CALCIUM 8.1* 8.4* 8.7* 9.0 9.1  MG  --  1.9 1.9 2.3  --    Liver Function Tests: Recent Labs  Lab 03/19/19 0256 03/20/19 0246 03/21/19 0736  AST ALT ALKPHOS 36* 44 44  BILITOT 0.7 0.6 0.5  PROT 6.0* 6.8 7.4  ALBUMIN 2.9* 3.2* 3.4*   No results for input(s): LIPASE, AMYLASE in the last 168 hours. No results for input(s): AMMONIA in the last 168 hours. CBC: Recent Labs  Lab 03/18/19 1406 03/19/19 0256 03/20/19 0246  03/21/19 0736  WBC 8.7 7.1 6.2 7.0  NEUTROABS 6.3 4.9 4.4 5.4  HGB 10.4* 10.5* 11.2* 12.2*  HCT 33.2* 33.2* 34.3* 36.8*  MCV 94.9 93.0 89.6 89.5  PLT 231 231 288 352   Cardiac Enzymes: Recent Labs  Lab 03/19/19 0256 03/20/19 0246 03/21/19 0736  CKTOTAL 185 84 55   BNP: Invalid input(s): POCBNP CBG: Recent Labs  Lab 03/18/19 1402  GLUCAP 107*   D-Dimer No results for input(s): DDIMER in the last 72 hours. Hgb A1c No results for input(s): HGBA1C in the last 72 hours. Lipid Profile No results for input(s): CHOL, HDL, LDLCALC, TRIG, CHOLHDL, LDLDIRECT in the last 72 hours. Thyroid function studies No results for input(s): TSH, T4TOTAL, T3FREE, THYROIDAB in the last 72 hours.  Invalid input(s): FREET3 Anemia work up No results for input(s): VITAMINB12, FOLATE, FERRITIN, TIBC, IRON, RETICCTPCT in the last 72 hours. Urinalysis No results found for: COLORURINE, APPEARANCEUR, LABSPEC, PHURINE, GLUCOSEU, HGBUR, BILIRUBINUR, KETONESUR, PROTEINUR, UROBILINOGEN, NITRITE, LEUKOCYTESUR Sepsis Labs Invalid input(s): PROCALCITONIN,  WBC,  LACTICIDVEN Microbiology Recent Results (from the past 240 hour(s))  Culture, blood (Routine X 2) w Reflex to ID Panel     Status: None (Preliminary result)   Collection Time: 03/18/19  5:05 PM  Result Value Ref Range Status   Specimen Description   Final    BLOOD ARM LEFT Performed at [facility], [address], [place], [place] [address]    Special Requests   Final    BOTTLES DRAWN AEROBIC AND ANAEROBIC Blood Culture results may not be optimal due to an excessive volume of blood received in culture bottles Performed at [facility], [address], [place], [place] [address]    Culture   Final    NO GROWTH 3 DAYS Performed at [facility], [address], [place], [place] [address]    Report Status PENDING  Incomplete  Culture, blood (Routine X 2) w Reflex to ID Panel     Status: None  (Preliminary result)   Collection Time: 03/18/19  5:10 PM  Result Value Ref Range Status   Specimen Description  Final    BLOOD RIGHT ANTECUBITAL Performed at [facility], [address], [place], [place] [address]    Special Requests   Final    BOTTLES DRAWN AEROBIC AND ANAEROBIC Blood Culture adequate volume Performed at [facility], [address], [place], [place] [address]    Culture   Final    NO GROWTH 3 DAYS Performed at [facility], [address] [place], [place] [address]    Report Status PENDING  Incomplete  Novel Coronavirus, NAA (hospital order; send-out to ref lab)     Status: None   Collection Time: 03/18/19  5:30 PM  Result Value Ref Range Status   SARS-CoV-2, NAA NOT DETECTED NOT DETECTED Final    Comment: Negative (Not Detected) results do not exclude infection caused by SARS CoV 2 and should not be used as the sole basis for treatment or other patient management decisions. Optimum specimen types and timing for peak viral levels during infections caused  by SARS CoV 2 have not been determined. Collection of multiple specimens (types and time points) from the same patient may be necessary to detect the virus. Improper specimen collection and handling, sequence variability underlying assay primers and or probes, or the presence of organisms in  quantities less than the limit of detection of the assay may lead to false negative results. Positive and negative predictive values of testing are highly dependent on prevalence. False negative results are more likely when prevalence of disease is high. (NOTE) The expected result is Negative (Not Detected). The SARS CoV 2 test is intended for the presumptive qualitative  detection of nucleic acid from SARS CoV 2 in upper and lower  respir atory specimens. Testing methodology is real time RT PCR. Test results must be correlated with clinical presentation and  evaluated in the  context of other laboratory and epidemiologic data.  Test performance can be affected because the epidemiology and  clinical spectrum of infection caused by SARS CoV 2 is not fully  known. For example, the optimum types of specimens to collect and  when during the course of infection these specimens are most likely  to contain detectable viral RNA may not be known. This test has not been Food and Drug Administration (FDA) cleared or  approved and has been authorized by FDA under an Emergency Use  Authorization (EUA). The test is only authorized for the duration of  the declaration that circumstances exist justifying the authorization  of emergency use of in vitro diagnostic tests for detection and or  diagnosis of SARS CoV 2 under Section 564(b)(1) of the Act, 21 U.S.C.  section [phone] 3(b)(1), unless the authorization is terminated or   revoked sooner. [facility] is certified under the  Clinical Laboratory Improvement Amendments of 1988 (CLIA), 42 U.S.C.  section [phone], to perform high complexity tests. Performed at [facility] CLIA [id number] [address], [place], [place] [address] Laboratory Director: Turner Daniels, MD Performed at [facility], [address], [place], [place] [address]    Coronavirus Source NASOPHARYNGEAL  Final    Comment: Performed at [facility], [address] [place], [place] [address]  Respiratory Panel by PCR     Status: None   Collection Time: 03/18/19  5:30 PM  Result Value Ref Range Status   Adenovirus NOT DETECTED NOT DETECTED Final   Coronavirus 229E NOT DETECTED NOT DETECTED Final  Comment: (NOTE) The Coronavirus on the Respiratory Panel, DOES NOT test for the novel  Coronavirus (2019 nCoV)    Coronavirus HKU1 NOT DETECTED NOT DETECTED Final   Coronavirus NL63 NOT DETECTED NOT DETECTED Final   Coronavirus OC43 NOT DETECTED NOT DETECTED Final    Metapneumovirus NOT DETECTED NOT DETECTED Final   Rhinovirus / Enterovirus NOT DETECTED NOT DETECTED Final   Influenza A NOT DETECTED NOT DETECTED Final   Influenza B NOT DETECTED NOT DETECTED Final   Parainfluenza Virus 1 NOT DETECTED NOT DETECTED Final   Parainfluenza Virus 2 NOT DETECTED NOT DETECTED Final   Parainfluenza Virus 3 NOT DETECTED NOT DETECTED Final   Parainfluenza Virus 4 NOT DETECTED NOT DETECTED Final   Respiratory Syncytial Virus NOT DETECTED NOT DETECTED Final   Bordetella pertussis NOT DETECTED NOT DETECTED Final   Chlamydophila pneumoniae NOT DETECTED NOT DETECTED Final   Mycoplasma pneumoniae NOT DETECTED NOT DETECTED Final    Comment: Performed at [facility], [address], [place], [place] [address]  MRSA PCR Screening     Status: None   Collection Time: 03/18/19  8:36 PM  Result Value Ref Range Status   MRSA by PCR NEGATIVE NEGATIVE Final    Comment:        The GeneXpert MRSA Assay (FDA approved for NASAL specimens only), is one component of a comprehensive MRSA colonization surveillance program. It is not intended to diagnose MRSA infection nor to guide or monitor treatment for MRSA infections. Performed at [facility], [address], [place], [place] [address]     SIGNED:   Jacquelin Hawking, MD [facility] 03/22/2019, 11:11 AM

## 2019-03-22 NOTE — TOC Transition Note (Addendum)
Transition of Care [facility]) - CM/SW Discharge Note   Patient Details  Name: Mark Strickland MRN: [id number] Date of Birth: [birth year]  Transition of Care [facility]) CM/SW Contact:  Clearance Coots, LCSW Phone Number: 03/22/2019, 11:04 AM   Clinical Narrative:    Patient is Voluntary: Voluntary form fax to [phone] Patient has been accepted to [facility] Room 306 Bed 2 Dr. Welton Flakes called to transport the patient.  Nurse given the number to call report [phone]) Please call before the patient leaves the unit.    Final next level of care: Psychiatric Hospital Barriers to Discharge: No Barriers Identified   Patient Goals and CMS Choice  Mental Health Treatment and Return Home      Discharge Placement                Patient to be transferred to facility by: [facility]       Discharge Plan and Services    [facility]                     Social Determinants of Health (SDOH) Interventions     Readmission Risk Interventions No flowsheet data found.

## 2019-03-22 NOTE — Progress Notes (Signed)
D: Pt was in dayroom upon initial approach.  Pt presents with anxious, depressed affect and mood.  He reports his day has "been good" and his goal is "trying to feel a little better."  Pt denies SI/HI, denies hallucinations, denies pain.  Pt has been visible in milieu interacting with peers and staff appropriately.  Pt attended evening group.    A: Introduced self to pt.  Actively listened to pt and offered support and encouragement.  Medication administered per order.  PRN medication administered for anxiety and pain.  Q15 minute safety checks maintained.  R: Pt is safe on the unit.  Pt is compliant with medications.  Pt verbally contracts for safety.  Will continue to monitor and assess.

## 2019-03-22 NOTE — [facility] Group Notes (Signed)
[facility] Group Notes:  (Nursing/MHT/Case Management/Adjunct)  Date:  03/22/2019  Time:  4:00 PM  Type of Therapy:  Music Therapy  Participation Level:  Active  Participation Quality:  Appropriate, Sharing and Supportive  Affect:  Appropriate  Cognitive:  Alert and Appropriate  Insight:  Appropriate and Improving  Engagement in Group:  Developing/Improving and Engaged  Modes of Intervention:  Discussion and Socialization  Summary of Progress/Problems: Patients were invited to share a song with the group that is meaningful to them. Patients were asked why they chose the song and what memories it brought back. Patients also discussed team building.   Frankie Scipio 03/22/2019, 5:38 PM 

## 2019-03-22 NOTE — Tx Team (Signed)
Initial Treatment Plan 03/22/2019 12:58 PM Mark Strickland [id number]    PATIENT STRESSORS: Substance abuse   PATIENT STRENGTHS: Ability for insight Capable of independent living General fund of knowledge   PATIENT IDENTIFIED PROBLEMS: "get on a better med" depression                     DISCHARGE CRITERIA:  Ability to meet basic life and health needs Improved stabilization in mood, thinking, and/or behavior Medical problems require only outpatient monitoring  PRELIMINARY DISCHARGE PLAN: Outpatient therapy  PATIENT/FAMILY INVOLVEMENT: This treatment plan has been presented to and reviewed with the patient, Mark Strickland.  The patient and family have been given the opportunity to ask questions and make suggestions.  Dewayne Shorter, RN 03/22/2019, 12:58 PM

## 2019-03-22 NOTE — [facility] Suicide Risk Assessment (Signed)
[facility] Admission Suicide Risk Assessment   Nursing information obtained from:  Patient Demographic factors:  Male Current Mental Status:  NA Loss Factors:  NA Historical Factors:  Prior suicide attempts, Impulsivity Risk Reduction Factors:  Sense of responsibility to family, Employed, Positive social support  Total Time spent with patient: 45 minutes Principal Problem: Polysubstance overdose Diagnosis:  Active Problems:   Opiate dependence (HCC)  Subjective Data: Patient believes he was starting Xanax states it was not a suicide attempt but has had short-term memory deficits and some long-term memory deficits thereafter  Continued Clinical Symptoms:  Alcohol Use Disorder Identification Test Final Score (AUDIT): 0 The "Alcohol Use Disorders Identification Test", Guidelines for Use in Primary Care, Second Edition.  World Science writer [facility]). Score between 0-7:  no or low risk or alcohol related problems. Score between 8-15:  moderate risk of alcohol related problems. Score between 16-19:  high risk of alcohol related problems. Score 20 or above:  warrants further diagnostic evaluation for alcohol dependence and treatment.   CLINICAL FACTORS:   Alcohol/Substance Abuse/Dependencies    COGNITIVE FEATURES THAT CONTRIBUTE TO RISK:  Loss of executive function    SUICIDE RISK:   Minimal: No identifiable suicidal ideation.  Patients presenting with no risk factors but with morbid ruminations; may be classified as minimal risk based on the severity of the depressive symptoms  PLAN OF CARE: Admitted for stabilization greatest risk to this patient is an unintentional overdose not an intentional 1  I certify that inpatient services furnished can reasonably be expected to improve the patient's condition.   Malvin Johns, MD 03/22/2019, 3:08 PM

## 2019-03-23 DIAGNOSIS — F17210 Nicotine dependence, cigarettes, uncomplicated: Secondary | ICD-10-CM

## 2019-03-23 LAB — CULTURE, BLOOD (ROUTINE X 2)
Culture: NO GROWTH
Culture: NO GROWTH
Special Requests: ADEQUATE

## 2019-03-23 MED ORDER — GABAPENTIN 300 MG PO CAPS
300.0000 mg | ORAL_CAPSULE | Freq: Two times a day (BID) | ORAL | Status: DC
  Administered 2019-03-23 – 2019-03-25 (×5): 300 mg via ORAL
  Filled 2019-03-23 (×5): qty 1
  Filled 2019-03-23: qty 28
  Filled 2019-03-23 (×2): qty 1
  Filled 2019-03-23: qty 28

## 2019-03-23 MED ORDER — NICOTINE 21 MG/24HR TD PT24
21.0000 mg | MEDICATED_PATCH | Freq: Every day | TRANSDERMAL | Status: DC
  Administered 2019-03-23 – 2019-03-25 (×3): 21 mg via TRANSDERMAL
  Filled 2019-03-23 (×5): qty 1

## 2019-03-23 NOTE — Progress Notes (Signed)
7a-7p Shift:  D:  Pt is anxious this shift but states that he feels somewhat confused at times since "having pneumonia" when he was in the emergency room.     A:  Support, education, and encouragement provided as appropriate to situation.  Medications administered per MD order.  Level 3 checks continued for safety.   R:  Pt receptive to measures; Safety maintained.

## 2019-03-23 NOTE — [facility] Group Notes (Signed)
LCSW Group Therapy Note  03/23/2019    10:00-11:00am   Type of Therapy and Topic:  Group Therapy: Early Messages Received About Anger  Participation Level:  Active   Description of Group:   In this group, patients shared and discussed the early messages received in their lives about anger through parental or other adult modeling, teaching, repression, punishment, violence, and more.  Participants identified how those childhood lessons influence even now how they usually or often react when angered.  The group discussed that anger is a secondary emotion and what may be the underlying emotional themes that come out through anger outbursts or that are ignored through anger suppression.  Finally, as a group there was a conversation about the workbook's quote that "There is nothing wrong with anger; it is just a sign something needs to change."     Therapeutic Goals: 1. Patients will identify one or more childhood message about anger that they received and how it was taught to them. 2. Patients will discuss how these childhood experiences have influenced and continue to influence their own expression or repression of anger even today. 3. Patients will explore possible primary emotions that tend to fuel their secondary emotion of anger. 4. Patients will learn that anger itself is normal and cannot be eliminated, and that healthier coping skills can assist with resolving conflict rather than worsening situations.  Summary of Patient Progress:  The patient was very insightful in group today. He shared that his childhood lessons about anger involved watching his mother expressed anger in sometimes  less than positive  Ways. He also described learning from a friend who had the ability to work and resolutions when conflict arises.  Therapeutic Modalities:   Cognitive Behavioral Therapy Motivation Interviewing  Henrene Dodge, LCSW

## 2019-03-23 NOTE — Progress Notes (Signed)
The focus of this group is to help patients establish daily goals to achieve during treatment and discuss how the patient can incorporate goal setting into their daily lives to aide in recovery. 

## 2019-03-23 NOTE — Progress Notes (Addendum)
[facility] MD Progress Note  03/23/2019 11:10 AM Mark Strickland  MRN:  [id number]   Subjective: Patient reports today that his mood has improved and denies any suicidal or homicidal ideations and denies any hallucinations.  Patient does report that he is having some issues with joint pain and back pain and states that he has been stopped on his gabapentin but was restarted on his Remeron.  He states that the gabapentin really helped him.  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  He states that he does have a place to live and he has 2 dogs there and he is employed still.  He states that he is looking for new employment to get out of the tree trimming service because it is rough on his body.  He starts reporting that he has been on Roxicodone, Klonopin, and gabapentin and is requesting to be started back on these medications.  He reports that he has not been sleeping well and is due to not being on his medications.  He reports having a good appetite and improved mood.  Objective: Patient's chart and findings reviewed and discussed with treatment team.  Patient presents in the day room watching TV but is not interacting with anyone at the time.  Patient stated that he had not slept very well the last 2-3 nights and is due to not having his medications.  Reviewed PMP aware and the patient was not showing prescriptions for Klonopin or Roxicodone and most recently was prescribed buprenorphine-naloxone in January 2020.  Reviewed medications with patient and decided to restart gabapentin at 300 mg p.o. twice daily to assist with some of his pain and to assist with rest.  Patient is future oriented about going back to work.  Principal Problem: MDD (major depressive disorder), recurrent episode, severe (HCC) Diagnosis: Principal Problem:   MDD (major depressive disorder), recurrent episode, severe (HCC) Active Problems:   Opiate dependence (HCC)  Total  Time spent with patient: 30 minutes  Past Psychiatric History: See H&P  Past Medical History:  Past Medical History:  Diagnosis Date  . Depression   . Heroin abuse [facility])     Past Surgical History:  Procedure Laterality Date  . NO PAST SURGERIES     Family History: History reviewed. No pertinent family history. Family Psychiatric  History: See H&P Social History:  Social History   Substance and Sexual Activity  Alcohol Use Not Currently     Social History   Substance and Sexual Activity  Drug Use Yes  . Types: IV, Heroin, Benzodiazepines    Social History   Socioeconomic History  . Marital status: Single    Spouse name: Not on file  . Number of children: Not on file  . Years of education: Not on file  . Highest education level: Not on file  Occupational History  . Occupation: "treeworkJournalist, newspaper  . Financial resource strain: Not on file  . Food insecurity:    Worry: Not on file    Inability: Not on file  . Transportation needs:    Medical: Not on file    Non-medical: Not on file  Tobacco Use  . Smoking status: Current Every Day Smoker    Packs/day: 0.50    Types: Cigarettes  . Smokeless tobacco: Never Used  Substance and Sexual Activity  . Alcohol use: Not Currently  . Drug use: Yes    Types: IV, Heroin, Benzodiazepines  . Sexual  activity: Not on file  Lifestyle  . Physical activity:    Days per week: Not on file    Minutes per session: Not on file  . Stress: Not on file  Relationships  . Social connections:    Talks on phone: Not on file    Gets together: Not on file    Attends religious service: Not on file    Active member of club or organization: Not on file    Attends meetings of clubs or organizations: Not on file    Relationship status: Not on file  Other Topics Concern  . Not on file  Social History Narrative  . Not on file   Additional Social History:                         Sleep: Fair  Appetite:  Good  Current  Medications: Current Facility-Administered Medications  Medication Dose Route Frequency Provider Last Rate Last Dose  . acetaminophen (TYLENOL) tablet 650 mg  650 mg Oral Q6H PRN Malvin Johns, MD   650 mg at 03/23/19 5366  . alum & mag hydroxide-simeth (MAALOX/MYLANTA) 200-200-20 MG/5ML suspension 30 mL  30 mL Oral Q4H PRN Malvin Johns, MD      . cloNIDine (CATAPRES) tablet 0.1 mg  0.1 mg Oral Q4H PRN Malvin Johns, MD   0.1 mg at 03/22/19 2246  . gabapentin (NEURONTIN) tablet 300 mg  300 mg Oral BID Money, Gerlene Burdock, FNP      . hydrOXYzine (ATARAX/VISTARIL) tablet 25 mg  25 mg Oral TID PRN Money, Gerlene Burdock, FNP   25 mg at 03/22/19 2019  . loratadine (CLARITIN) tablet 10 mg  10 mg Oral Daily Money, Gerlene Burdock, FNP   10 mg at 03/23/19 0818  . magnesium hydroxide (MILK OF MAGNESIA) suspension 30 mL  30 mL Oral Daily PRN Malvin Johns, MD      . memantine [facility]) tablet 10 mg  10 mg Oral BID Malvin Johns, MD   10 mg at 03/23/19 0817  . mirtazapine (REMERON) tablet 15 mg  15 mg Oral QHS Nira Conn A, NP   15 mg at 03/22/19 2125  . nicotine (NICODERM CQ - dosed in mg/24 hours) patch 21 mg  21 mg Transdermal Daily Kura Bethards, Rockey Situ, MD   21 mg at 03/23/19 0818  . omega-3 acid ethyl esters (LOVAZA) capsule 1 g  1 g Oral BID Malvin Johns, MD   1 g at 03/23/19 0820  . prenatal multivitamin tablet 1 tablet  1 tablet Oral Q1200 Malvin Johns, MD   1 tablet at 03/23/19 0820    Lab Results:  Results for orders placed or performed during the hospital encounter of 03/18/19 (from the past 48 hour(s))  Basic metabolic panel     Status: Abnormal   Collection Time: 03/22/19  3:38 AM  Result Value Ref Range   Sodium 141 135 - 145 mmol/L   Potassium 3.9 3.5 - 5.1 mmol/L   Chloride 111 98 - 111 mmol/L   CO2 22 22 - 32 mmol/L   Glucose, Bld 102 (H) 70 - 99 mg/dL   BUN 28 (H) 6 - 20 mg/dL   Creatinine, Ser 4.40 0.61 - 1.24 mg/dL   Calcium 9.1 8.9 - 34.7 mg/dL   GFR calc non Af Amer >60 >60 mL/min   GFR calc Af  Amer >60 >60 mL/min   Anion gap 8 5 - 15    Comment: Performed at [facility], [address] [place], [place] [address]    Blood Alcohol level:  Lab Results  Component Value Date   ETH <10 03/18/2019    Metabolic Disorder Labs: No results found for: HGBA1C, MPG No results found for: PROLACTIN No results found for: CHOL, TRIG, HDL, CHOLHDL, VLDL, LDLCALC  Physical Findings: AIMS:  , ,  ,  ,    CIWA:  CIWA-Ar Total: 0 COWS:     Musculoskeletal: Strength & Muscle Tone: within normal limits Gait & Station: normal Patient leans: N/A  Psychiatric Specialty Exam: Physical Exam  Nursing note and vitals reviewed. Constitutional: He is oriented to person, place, and time. He appears well-developed and well-nourished.  Cardiovascular: Normal rate.  Respiratory: Effort normal.  Musculoskeletal: Normal range of motion.  Neurological: He is alert and oriented to person, place, and time.  Skin: Skin is warm.    Review of Systems  Constitutional: Negative.   HENT: Negative.   Eyes: Negative.   Respiratory: Negative.   Cardiovascular: Negative.   Gastrointestinal: Negative.   Genitourinary: Negative.   Musculoskeletal: Negative.   Skin: Negative.   Neurological: Negative.   Endo/Heme/Allergies: Negative.   Psychiatric/Behavioral: Positive for substance abuse.    Blood pressure 119/78, pulse 90, temperature 98.3 F (36.8 C), temperature source Oral, resp. rate 20, height 5\' 11"  (1.803 m), weight 69.4 kg, SpO2 100 %.Body mass index is 21.34 kg/m.  General Appearance: Casual  Eye Contact:  Good  Speech:  Clear and Coherent and Normal Rate  Volume:  Normal  Mood:  Euthymic  Affect:  Congruent  Thought Process:  Coherent and Descriptions of Associations: Intact  Orientation:  Full (Time, Place, and Person)  Thought Content:  WDL  Suicidal Thoughts:  No  Homicidal Thoughts:  No  Memory:  Immediate;   Good Recent;   Good Remote;   Good  Judgement:   Fair  Insight:  Fair  Psychomotor Activity:  Normal  Concentration:  Concentration: Good and Attention Span: Good  Recall:  Good  Fund of Knowledge:  Good  Language:  Good  Akathisia:  No  Handed:  Right  AIMS (if indicated):     Assets:  Communication Skills Desire for Improvement Physical Health Resilience Social Support Transportation  ADL's:  Intact  Cognition:  WNL  Sleep:  Number of Hours: 0   Problems addressed MDD severe recurrent Opiate dependence  Treatment Plan Summary: Daily contact with patient to assess and evaluate symptoms and progress in treatment, Medication management and Plan is to: Start Neurontin 300 mg p.o. twice daily for pain and mood stability Continue clonidine detox protocol for opiate withdrawal Continue Vistaril 25 mg p.o. 3 times daily PRN for anxiety Continue Remeron 15 mg p.o. nightly for sleep Continue Namenda 10 mg p.o. twice daily Encourage group therapy participation Social work to assist with disposition planning for discharge  Maryfrances Bunnell, FNP 03/23/2019, 11:10 AM   Attest to NP progress note

## 2019-03-24 MED ORDER — TRAZODONE HCL 50 MG PO TABS
50.0000 mg | ORAL_TABLET | Freq: Once | ORAL | Status: AC
  Administered 2019-03-24: 01:00:00 50 mg via ORAL
  Filled 2019-03-24 (×2): qty 1

## 2019-03-24 MED ORDER — TRAZODONE HCL 50 MG PO TABS
50.0000 mg | ORAL_TABLET | Freq: Every evening | ORAL | Status: DC | PRN
  Administered 2019-03-24: 22:00:00 50 mg via ORAL
  Filled 2019-03-24: qty 14
  Filled 2019-03-24: qty 1

## 2019-03-24 NOTE — Progress Notes (Signed)
[facility] Group Notes:  (Nursing/MHT/Case Management/Adjunct)  Date:  03/24/2019  Time:  2030  Type of Therapy:  wrap up group  Participation Level:  Active  Participation Quality:  Appropriate, Attentive, Sharing and Supportive  Affect:  Flat  Cognitive:  Appropriate  Insight:  Improving  Engagement in Group:  Engaged  Modes of Intervention:  Clarification, Education and Support  Summary of Progress/Problems: Pt reports getting better sleep last night. If pt could change any one thing about his life it would be to have waited and not gone to college right after high school. Pt reports having 3 semesters left to graduate but not currently in school. Pt shares he is grateful for his two dogs.   Marcille Buffy 03/24/2019, 9:56 PM

## 2019-03-24 NOTE — [facility] Group Notes (Addendum)
[facility] LCSW Group Therapy Note  Date/Time:  03/24/2019 9:00-10:00 or 10:00-11:00AM  Type of Therapy and Topic:  Group Therapy:  Healthy and Unhealthy Supports  Participation Level:  Active   Description of Group:  Patients in this group were introduced to the idea of adding a variety of healthy supports to address the various needs in their lives.Patients discussed what additional healthy supports could be helpful in their recovery and wellness after discharge in order to prevent future hospitalizations.   An emphasis was placed on using counselor, doctor, therapy groups, 12-step groups, and problem-specific support groups to expand supports.  They also worked as a group on developing a specific plan for several patients to deal with unhealthy supports through boundary-setting, psychoeducation with loved ones, and even termination of relationships.   Therapeutic Goals:   1)  discuss importance of adding supports to stay well once out of the hospital  2)  compare healthy versus unhealthy supports and identify some examples of each  3)  generate ideas and descriptions of healthy supports that can be added  4)  offer mutual support about how to address unhealthy supports  5)  encourage active participation in and adherence to discharge plan    Summary of Patient Progress:  The patient stated that current healthy supports in his life are family who sometimes are also unhealthy supports as well.  The patient expressed a willingness to add support groups and therapy as support(s) to help in his recovery journey.   Therapeutic Modalities:   Motivational Interviewing Brief Solution-Focused Therapy  [name]

## 2019-03-24 NOTE — Progress Notes (Signed)
D. Pt presents with an anxious affect- brightens during interactions- observed interacting well with peers and staff in the milieu. Per pt's self inventory, pt rates his depression, hopelessness and anxiety a 0/0/2, respectively.Pt writes that his goal today is "work on keeping the positive attitude I woke up with" and "remind myself why I'm here". Pt currently denies SI/HI and AV hallucinations. A. Labs and vitals monitored. Pt compliant with medications. Pt supported emotionally and encouraged to express concerns and ask questions.   R. Pt remains safe with 15 minute checks. Will continue POC.

## 2019-03-24 NOTE — Progress Notes (Signed)
D: Patient observed up and visible in the milieu. Watching tv with peers. Patient states he's had a good day. Asking for trazadone for sleep as he said it was helpful last night, and remeron was not effective. Patient's affect animated, mood anxious but pleasant. Denies pain, physical complaints.   A: NP contacted and order received. Medicated per orders, prn vistaril and trazadone given for anxiety and to promote sleep. Medication education provided. Level III obs in place for safety. Emotional support offered. Patient encouraged to complete Suicide Safety Plan before discharge.  R: Patient verbalizes understanding of POC. On reassess, patient was asleep. Patient denies SI/HI/AVH and remains safe on level III obs. Will continue to monitor throughout the night.

## 2019-03-24 NOTE — Progress Notes (Addendum)
[facility] MD Progress Note  03/24/2019 1:46 PM Blanche Luczak  MRN:  [id number]   Subjective: Patient reports that he is doing much better today and was sleeping better. He reports that the gabapentin has stopped him having so much pain and now his pain has decreased. He states he does not need more medication and he is comfortable now. He denies any suicidal or homicidal ideations and denies any hallucinations. He reports having a good appetite. He states that he feels he will be ready to discharge tomorrow.    Objective: Patient's chart and findings reviewed and discussed with treatment team.  Patient presents in the day room interacting with peers and staff appropriately. He has been pleasant, calm, and cooperative. There have been no complaints about the patient. He denies any medication side effects.    Principal Problem: MDD (major depressive disorder), recurrent episode, severe (HCC) Diagnosis: Principal Problem:   MDD (major depressive disorder), recurrent episode, severe (HCC) Active Problems:   Opiate dependence (HCC)  Total Time spent with patient: 20 minutes  Past Psychiatric History: See H&P  Past Medical History:  Past Medical History:  Diagnosis Date  . Depression   . Heroin abuse [facility])     Past Surgical History:  Procedure Laterality Date  . NO PAST SURGERIES     Family History: History reviewed. No pertinent family history. Family Psychiatric  History: See H&P Social History:  Social History   Substance and Sexual Activity  Alcohol Use Not Currently     Social History   Substance and Sexual Activity  Drug Use Yes  . Types: IV, Heroin, Benzodiazepines    Social History   Socioeconomic History  . Marital status: Single    Spouse name: Not on file  . Number of children: Not on file  . Years of education: Not on file  . Highest education level: Not on file  Occupational History  . Occupation: "treeworkJournalist, newspaper  . Financial resource strain: Not on  file  . Food insecurity:    Worry: Not on file    Inability: Not on file  . Transportation needs:    Medical: Not on file    Non-medical: Not on file  Tobacco Use  . Smoking status: Current Every Day Smoker    Packs/day: 0.50    Types: Cigarettes  . Smokeless tobacco: Never Used  Substance and Sexual Activity  . Alcohol use: Not Currently  . Drug use: Yes    Types: IV, Heroin, Benzodiazepines  . Sexual activity: Not on file  Lifestyle  . Physical activity:    Days per week: Not on file    Minutes per session: Not on file  . Stress: Not on file  Relationships  . Social connections:    Talks on phone: Not on file    Gets together: Not on file    Attends religious service: Not on file    Active member of club or organization: Not on file    Attends meetings of clubs or organizations: Not on file    Relationship status: Not on file  Other Topics Concern  . Not on file  Social History Narrative  . Not on file   Additional Social History:                         Sleep: Fair  Appetite:  Good  Current Medications: Current Facility-Administered Medications  Medication Dose Route Frequency Provider Last Rate Last Dose  .  acetaminophen (TYLENOL) tablet 650 mg  650 mg Oral Q6H PRN Malvin Johns, MD   650 mg at 03/23/19 2339  . alum & mag hydroxide-simeth (MAALOX/MYLANTA) 200-200-20 MG/5ML suspension 30 mL  30 mL Oral Q4H PRN Malvin Johns, MD   30 mL at 03/23/19 1112  . cloNIDine (CATAPRES) tablet 0.1 mg  0.1 mg Oral Q4H PRN Malvin Johns, MD   0.1 mg at 03/22/19 2246  . gabapentin (NEURONTIN) capsule 300 mg  300 mg Oral BID Money, Gerlene Burdock, FNP   300 mg at 03/24/19 0175  . hydrOXYzine (ATARAX/VISTARIL) tablet 25 mg  25 mg Oral TID PRN Money, Gerlene Burdock, FNP   25 mg at 03/23/19 2203  . loratadine (CLARITIN) tablet 10 mg  10 mg Oral Daily Money, Gerlene Burdock, FNP   10 mg at 03/24/19 0836  . magnesium hydroxide (MILK OF MAGNESIA) suspension 30 mL  30 mL Oral Daily PRN Malvin Johns, MD      . memantine [facility]) tablet 10 mg  10 mg Oral BID Malvin Johns, MD   10 mg at 03/24/19 0837  . mirtazapine (REMERON) tablet 15 mg  15 mg Oral QHS Nira Conn A, NP   15 mg at 03/23/19 2203  . nicotine (NICODERM CQ - dosed in mg/24 hours) patch 21 mg  21 mg Transdermal Daily Lun Muro, Rockey Situ, MD   21 mg at 03/24/19 1025  . omega-3 acid ethyl esters (LOVAZA) capsule 1 g  1 g Oral BID Malvin Johns, MD   1 g at 03/24/19 [phone]  . prenatal multivitamin tablet 1 tablet  1 tablet Oral Q1200 Malvin Johns, MD   1 tablet at 03/23/19 0820    Lab Results:  No results found for this or any previous visit (from the past 48 hour(s)).  Blood Alcohol level:  Lab Results  Component Value Date   ETH <10 03/18/2019    Metabolic Disorder Labs: No results found for: HGBA1C, MPG No results found for: PROLACTIN No results found for: CHOL, TRIG, HDL, CHOLHDL, VLDL, LDLCALC  Physical Findings: AIMS: Facial and Oral Movements Muscles of Facial Expression: None, normal Lips and Perioral Area: None, normal Jaw: None, normal Tongue: None, normal,Extremity Movements Upper (arms, wrists, hands, fingers): None, normal Lower (legs, knees, ankles, toes): None, normal, Trunk Movements Neck, shoulders, hips: None, normal, Overall Severity Severity of abnormal movements (highest score from questions above): None, normal Incapacitation due to abnormal movements: None, normal Patient's awareness of abnormal movements (rate only patient's report): No Awareness, Dental Status Current problems with teeth and/or dentures?: No Does patient usually wear dentures?: No  CIWA:  CIWA-Ar Total: 0 COWS:     Musculoskeletal: Strength & Muscle Tone: within normal limits Gait & Station: normal Patient leans: N/A  Psychiatric Specialty Exam: Physical Exam  Nursing note and vitals reviewed. Constitutional: He is oriented to person, place, and time. He appears well-developed and well-nourished.   Cardiovascular: Normal rate.  Respiratory: Effort normal.  Musculoskeletal: Normal range of motion.  Neurological: He is alert and oriented to person, place, and time.  Skin: Skin is warm.    Review of Systems  Constitutional: Negative.   HENT: Negative.   Eyes: Negative.   Respiratory: Negative.   Cardiovascular: Negative.   Gastrointestinal: Negative.   Genitourinary: Negative.   Musculoskeletal: Negative.   Skin: Negative.   Neurological: Negative.   Endo/Heme/Allergies: Negative.   Psychiatric/Behavioral: Positive for substance abuse.    Blood pressure 117/79, pulse 74, temperature 98.3 F (36.8 C), temperature source Oral,  resp. rate 20, height  (1.803 m), weight 69.4 kg, SpO2 100 %.Body mass index is 21.34 kg/m.  General Appearance: Casual  Eye Contact:  Good  Speech:  Clear and Coherent and Normal Rate  Volume:  Normal  Mood:  Euthymic  Affect:  Congruent  Thought Process:  Coherent and Descriptions of Associations: Intact  Orientation:  Full (Time, Place, and Person)  Thought Content:  WDL  Suicidal Thoughts:  No  Homicidal Thoughts:  No  Memory:  Immediate;   Good Recent;   Good Remote;   Good  Judgement:  Fair  Insight:  Fair  Psychomotor Activity:  Normal  Concentration:  Concentration: Good and Attention Span: Good  Recall:  Good  Fund of Knowledge:  Good  Language:  Good  Akathisia:  No  Handed:  Right  AIMS (if indicated):     Assets:  Communication Skills Desire for Improvement Physical Health Resilience Social Support Transportation  ADL's:  Intact  Cognition:  WNL  Sleep:  Number of Hours: 6.25   Problems addressed MDD severe recurrent Opiate dependence  Treatment Plan Summary: Daily contact with patient to assess and evaluate symptoms and progress in treatment, Medication management and Plan is to: Continue Neurontin 300 mg p.o. twice daily for pain and mood stability Continue clonidine detox protocol for opiate  withdrawal Continue Vistaril 25 mg p.o. 3 times daily PRN for anxiety Continue Remeron 15 mg p.o. nightly for sleep Continue Namenda 10 mg p.o. twice daily Encourage group therapy participation Social work to assist with disposition planning for discharge. Potential discharge tomorrow  Maryfrances Bunnell, FNP 03/24/2019, 1:46 PM   Attest to NP progress note

## 2019-03-25 DIAGNOSIS — F332 Major depressive disorder, recurrent severe without psychotic features: Principal | ICD-10-CM

## 2019-03-25 MED ORDER — MEMANTINE HCL 10 MG PO TABS: 10.0000 mg | ORAL_TABLET | Freq: Two times a day (BID) | ORAL | 0 refills | Status: DC

## 2019-03-25 MED ORDER — NICOTINE 21 MG/24HR TD PT24: 21.0000 mg | MEDICATED_PATCH | Freq: Every day | TRANSDERMAL | 0 refills | Status: DC

## 2019-03-25 MED ORDER — OMEGA-3-ACID ETHYL ESTERS 1 G PO CAPS: 1.0000 g | ORAL_CAPSULE | Freq: Two times a day (BID) | ORAL | Status: DC

## 2019-03-25 MED ORDER — MIRTAZAPINE 15 MG PO TABS: 15.0000 mg | ORAL_TABLET | Freq: Every day | ORAL | 0 refills | Status: DC

## 2019-03-25 MED ORDER — LORATADINE 10 MG PO TABS: 10.0000 mg | ORAL_TABLET | Freq: Every day | ORAL | Status: DC

## 2019-03-25 MED ORDER — TRAZODONE HCL 50 MG PO TABS: 50.0000 mg | ORAL_TABLET | Freq: Every evening | ORAL | 0 refills | Status: DC | PRN

## 2019-03-25 MED ORDER — HYDROXYZINE HCL 25 MG PO TABS: 25.0000 mg | ORAL_TABLET | Freq: Three times a day (TID) | ORAL | 0 refills | Status: DC | PRN

## 2019-03-25 MED ORDER — GABAPENTIN 300 MG PO CAPS: 300.0000 mg | ORAL_CAPSULE | Freq: Two times a day (BID) | ORAL | 0 refills | Status: DC

## 2019-03-25 NOTE — [facility] Suicide Risk Assessment (Signed)
[facility] INPATIENT:  Family/Significant Other Suicide Prevention Education  Suicide Prevention Education:  Patient Refusal for Family/Significant Other Suicide Prevention Education: The patient Mark Strickland has refused to provide written consent for family/significant other to be provided Family/Significant Other Suicide Prevention Education during admission and/or prior to discharge.  Physician notified.  Patient denies SI, Suicide Prevention Education pamphlet on patient's chart.  Darreld Mclean 03/25/2019, 9:41 AM

## 2019-03-25 NOTE — Progress Notes (Signed)
Discharge Note:  Patient discharged home with family member.  Patient denied SI and HI.  Denied A/V hallucinations.  Suicide prevention information given and discussed with patient who stated he understood and had no questions.  Patient stated he received all his belongings, clothing, toiletries, misc items, etc.  Patient stated he appreciated all assistance received from [employer] staff.  All required discharge information given to patient at discharge.  Patient given My3 suicide prevention information at discharge.  Also survey given to patient before he was discharged.

## 2019-03-25 NOTE — Progress Notes (Signed)
  [facility] Adult Case Management Discharge Plan :  Will you be returning to the same living situation after discharge:  Yes,  home At discharge, do you have transportation home?: Yes,  getting a ride Do you have the ability to pay for your medications: Yes,  income from employment  Release of information consent forms completed and in the chart Patient to Follow up at: Follow-up Information    [facility] Follow up on 03/29/2019.   Why:  Hospital follow up appointment is Friday 4/10 at 9:00a.  At this time, the appointment will be held over the telephone.  Contact information: [address] [place] [address] [phone]           Next level of care provider has access to [facility] Link:yes  Safety Planning and Suicide Prevention discussed: Yes,  with patient  Have you used any form of tobacco in the last 30 days? (Cigarettes, Smokeless Tobacco, Cigars, and/or Pipes): Yes  Has patient been referred to the Quitline?: Patient refused referral  Patient has been referred for addiction treatment: Yes  Darreld Mclean, LCSWA 03/25/2019, 11:15 AM

## 2019-03-25 NOTE — Tx Team (Signed)
Interdisciplinary Treatment and Diagnostic Plan Update  03/25/2019 Time of Session: 9:00am Mark Strickland MRN: [id number]  Principal Diagnosis: MDD (major depressive disorder), recurrent episode, severe (HCC)  Secondary Diagnoses: Principal Problem:   MDD (major depressive disorder), recurrent episode, severe (HCC) Active Problems:   Opiate dependence (HCC)   Current Medications:  Current Facility-Administered Medications  Medication Dose Route Frequency Provider Last Rate Last Dose  . acetaminophen (TYLENOL) tablet 650 mg  650 mg Oral Q6H PRN Malvin Johns, MD   650 mg at 03/23/19 2339  . alum & mag hydroxide-simeth (MAALOX/MYLANTA) 200-200-20 MG/5ML suspension 30 mL  30 mL Oral Q4H PRN Malvin Johns, MD   30 mL at 03/23/19 1112  . cloNIDine (CATAPRES) tablet 0.1 mg  0.1 mg Oral Q4H PRN Malvin Johns, MD   0.1 mg at 03/22/19 2246  . gabapentin (NEURONTIN) capsule 300 mg  300 mg Oral BID Money, Gerlene Burdock, FNP   300 mg at 03/25/19 4098  . hydrOXYzine (ATARAX/VISTARIL) tablet 25 mg  25 mg Oral TID PRN Money, Gerlene Burdock, FNP   25 mg at 03/24/19 2139  . loratadine (CLARITIN) tablet 10 mg  10 mg Oral Daily Money, Gerlene Burdock, FNP   10 mg at 03/25/19 0739  . magnesium hydroxide (MILK OF MAGNESIA) suspension 30 mL  30 mL Oral Daily PRN Malvin Johns, MD      . memantine [facility]) tablet 10 mg  10 mg Oral BID Malvin Johns, MD   10 mg at 03/25/19 0738  . mirtazapine (REMERON) tablet 15 mg  15 mg Oral QHS Nira Conn A, NP   15 mg at 03/24/19 2138  . nicotine (NICODERM CQ - dosed in mg/24 hours) patch 21 mg  21 mg Transdermal Daily Cobos, Rockey Situ, MD   21 mg at 03/25/19 0739  . omega-3 acid ethyl esters (LOVAZA) capsule 1 g  1 g Oral BID Malvin Johns, MD   1 g at 03/25/19 [phone]  . prenatal multivitamin tablet 1 tablet  1 tablet Oral Q1200 Malvin Johns, MD   1 tablet at 03/24/19 1650  . traZODone (DESYREL) tablet 50 mg  50 mg Oral QHS PRN Jackelyn Poling, NP   50 mg at 03/24/19 2156   PTA  Medications: Medications Prior to Admission  Medication Sig Dispense Refill Last Dose  . amoxicillin-clavulanate (AUGMENTIN) 875-125 MG tablet Take 1 tablet by mouth every 12 (twelve) hours for 3 days.       Patient Stressors: Substance abuse  Patient Strengths: Ability for insight Capable of independent living General fund of knowledge  Treatment Modalities: Medication Management, Group therapy, Case management,  1 to 1 session with clinician, Psychoeducation, Recreational therapy.   Physician Treatment Plan for Primary Diagnosis: MDD (major depressive disorder), recurrent episode, severe (HCC) Long Term Goal(s): Improvement in symptoms so as ready for discharge Improvement in symptoms so as ready for discharge   Short Term Goals: Ability to verbalize feelings will improve Compliance with prescribed medications will improve  Medication Management: Evaluate patient's response, side effects, and tolerance of medication regimen.  Therapeutic Interventions: 1 to 1 sessions, Unit Group sessions and Medication administration.  Evaluation of Outcomes: Adequate for Discharge  Physician Treatment Plan for Secondary Diagnosis: Principal Problem:   MDD (major depressive disorder), recurrent episode, severe (HCC) Active Problems:   Opiate dependence (HCC)  Long Term Goal(s): Improvement in symptoms so as ready for discharge Improvement in symptoms so as ready for discharge   Short Term Goals: Ability to verbalize feelings will improve Compliance  with prescribed medications will improve     Medication Management: Evaluate patient's response, side effects, and tolerance of medication regimen.  Therapeutic Interventions: 1 to 1 sessions, Unit Group sessions and Medication administration.  Evaluation of Outcomes: Adequate for Discharge   RN Treatment Plan for Primary Diagnosis: MDD (major depressive disorder), recurrent episode, severe (HCC) Long Term Goal(s): Knowledge of disease  and therapeutic regimen to maintain health will improve  Short Term Goals: Ability to identify and develop effective coping behaviors will improve and Compliance with prescribed medications will improve  Medication Management: RN will administer medications as ordered by provider, will assess and evaluate patient's response and provide education to patient for prescribed medication. RN will report any adverse and/or side effects to prescribing provider.  Therapeutic Interventions: 1 on 1 counseling sessions, Psychoeducation, Medication administration, Evaluate responses to treatment, Monitor vital signs and CBGs as ordered, Perform/monitor CIWA, COWS, AIMS and Fall Risk screenings as ordered, Perform wound care treatments as ordered.  Evaluation of Outcomes: Adequate for Discharge   LCSW Treatment Plan for Primary Diagnosis: MDD (major depressive disorder), recurrent episode, severe (HCC) Long Term Goal(s): Safe transition to appropriate next level of care at discharge, Engage patient in therapeutic group addressing interpersonal concerns.  Short Term Goals: Engage patient in aftercare planning with referrals and resources, Increase social support, Identify triggers associated with mental health/substance abuse issues and Increase skills for wellness and recovery  Therapeutic Interventions: Assess for all discharge needs, 1 to 1 time with Social worker, Explore available resources and support systems, Assess for adequacy in community support network, Educate family and significant other(s) on suicide prevention, Complete Psychosocial Assessment, Interpersonal group therapy.  Evaluation of Outcomes: Adequate for Discharge   Progress in Treatment: Attending groups: Yes. Participating in groups: Yes. Taking medication as prescribed: Yes. Toleration medication: Yes. Family/Significant other contact made: Yes, individual(s) contacted:  patient declined consents, SPE reviewed with  patient. Patient understands diagnosis: No. Discussing patient identified problems/goals with staff: Yes. Medical problems stabilized or resolved: Yes. Denies suicidal/homicidal ideation: Yes. Issues/concerns per patient self-inventory: No.  New problem(s) identified: No, Describe:  none  New Short Term/Long Term Goal(s): detox, medication management for mood stabilization; elimination of SI thoughts; development of comprehensive mental wellness/sobriety plan.  Patient Goals:  Medication management and discharge  Discharge Plan or Barriers: Discharge home, follow up with outpatient providers, get back involved with [facility]   Reason for Continuation of Hospitalization: Anxiety Withdrawal symptoms  Estimated Length of Stay: discharge today  Attendees: Patient: Mark Strickland 03/25/2019 9:35 AM  Physician:  03/25/2019 9:35 AM  Nursing:  03/25/2019 9:35 AM  RN Care Manager: 03/25/2019 9:35 AM  Social Worker: Enid Cutter, LCSWA 03/25/2019 9:35 AM  Recreational Therapist:  03/25/2019 9:35 AM  Other:  03/25/2019 9:35 AM  Other:  03/25/2019 9:35 AM  Other: 03/25/2019 9:35 AM    Scribe for Treatment Team: Darreld Mclean, LCSWA 03/25/2019 9:35 AM

## 2019-03-25 NOTE — Discharge Summary (Signed)
Physician Discharge Summary Note  Patient:  Mark Strickland is an 28 y.o., male MRN:  [id number] DOB:  1991/06/30 Patient phone:  [phone] (home)  Patient address:   [address] [place] [id number],  Total Time spent with patient: 15 minutes  Date of Admission:  03/22/2019 Date of Discharge: 03/25/19  Reason for Admission: heroin overdose  Principal Problem: MDD (major depressive disorder), recurrent episode, severe (HCC) Discharge Diagnoses: Principal Problem:   MDD (major depressive disorder), recurrent episode, severe (HCC) Active Problems:   Opiate dependence (HCC)   Past Psychiatric History: Heroin abuse  Past Medical History:  Past Medical History:  Diagnosis Date  . Depression   . Heroin abuse [facility](HCC)     Past Surgical History:  Procedure Laterality Date  . NO PAST SURGERIES     Family History: History reviewed. No pertinent family history. Family Psychiatric  History: Paternal uncle alcohol abuse. Social History:  Social History   Substance and Sexual Activity  Alcohol Use Not Currently     Social History   Substance and Sexual Activity  Drug Use Yes  . Types: IV, Heroin, Benzodiazepines    Social History   Socioeconomic History  . Marital status: Single    Spouse name: Not on file  . Number of children: Not on file  . Years of education: Not on file  . Highest education level: Not on file  Occupational History  . Occupation: "treeworkJournalist, newspaper"  Social Needs  . Financial resource strain: Not on file  . Food insecurity:    Worry: Not on file    Inability: Not on file  . Transportation needs:    Medical: Not on file    Non-medical: Not on file  Tobacco Use  . Smoking status: Current Every Day Smoker    Packs/day: 0.50    Types: Cigarettes  . Smokeless tobacco: Never Used  Substance and Sexual Activity  . Alcohol use: Not Currently  . Drug use: Yes    Types: IV, Heroin, Benzodiazepines  . Sexual activity: Not on file  Lifestyle  . Physical  activity:    Days per week: Not on file    Minutes per session: Not on file  . Stress: Not on file  Relationships  . Social connections:    Talks on phone: Not on file    Gets together: Not on file    Attends religious service: Not on file    Active member of club or organization: Not on file    Attends meetings of clubs or organizations: Not on file    Relationship status: Not on file  Other Topics Concern  . Not on file  Social History Narrative  . Not on file    Hospital Course:  Per admission assessment 03/21/2019: Per chart review, patient was admitted with heroin overdose. He became unresponsive while talking to a friend over the phone. He was given one round of Narcan by EMS with some improvement. He was given another round of Narcan in the ED. He required a nonrebreather. His hospital course has been complicated by acute hypoxic respiratory failure with bilateral infiltrates. He was placed on airborne and droplet precautions. His test came back negative for Covid. His mother reports that his overdose was a suicide attempt although the patient denies a suicide attempt. UDS was positive for benzodiazepines, opiates and cocaine on admission. On interview, Mark Strickland denies attempting suicide. He reports that his overdose was unintentional. His friend that he was on the phone with while using heroin  came to his house after he became unresponsive. He reportedly performed CPR until EMS arrived. He reports that he was doing well for the past year and he is unsure why his mother thought that he would harm himself. He reports that he is working at a good job. He denies problems with depression although he admits to intermittent depressed mood in the past that has not been sustained. He denies SI, HI or AVH. He denies a history of suicide attempts. He denies problems with sleep or appetite. He denies a history of manic symptoms (decreased need for sleep, increased energy, pressured speech or euphoria).  He appears to minimize his heroin use. He initially reports using 2-3 times weekly and then reports using twice in the past few months with last use being 9 months ago. He reports that he was hospitalized a year ago for heroin use.  From admission H&P 03/22/2019: This is the first admission here for Mark Strickland is a 28 year old patient who presented after an overdose he gives variable accounts of the event but the bottom line is he was snorting heroin, drug screen also showed cocaine and benzodiazepines.  Given his story varies, at the present time, he tells me it was not a suicide attempt but rather he was just going through his things at home, found "a bag of Xanax" he began snorting it.  He surprised to learn that it was not entirely Xanax- Earlier accounts involved him admitting injecting heroin in his feet vessels. He required Narcan. In the emergency department was noted to have acute respiratory failure/bilateral infiltrates on chest x-ray was hypoxic with sats 91 to 92% and he tested negative for COVID. Drug screen positive for benzodiazepines, opiates and cocaine, his mother believes that this was in fact a suicide attempt according to notes. Since that time his initial ER presentation, he reports short-term memory deficits he can recall 3 of 3 immediately but none of 3 after 30 seconds and several routine questions.  He is now reporting no thoughts of harming himself or others states he is not particular depressed at the present time. He denies the need for detox states he will not have withdrawal symptoms because he is "been in recovery" until he found this bag of leftover drugs.  [name] was admitted for heroin overdose, with UDS also positive for BZDs and cocaine. He denied suicidal intent behind overdose. His mother reported he had been making suicidal statements recently. He was started on Remeron, PRN trazodone, PRN Vistaril, gabapentin, Namenda and fish oil for memory loss after possible anoxic  injury. He participated in group therapy on the unit. He responded well to treatment with no adverse effects reported. He remained on the [facility] unit for 3 days. He stabilized with medication and therapy. He was discharged on the medications listed below. He has shown improvement with improved mood, affect, sleep, appetite, and interaction. He denies any SI/HI/AVH and contracts for safety. He agrees to follow up at [facility] (see below). Patient is provided with prescriptions for medications upon discharge. He is getting a ride for discharge home.  Physical Findings: AIMS: Facial and Oral Movements Muscles of Facial Expression: None, normal Lips and Perioral Area: None, normal Jaw: None, normal Tongue: None, normal,Extremity Movements Upper (arms, wrists, hands, fingers): None, normal Lower (legs, knees, ankles, toes): None, normal, Trunk Movements Neck, shoulders, hips: None, normal, Overall Severity Severity of abnormal movements (highest score from questions above): None, normal Incapacitation due to abnormal movements: None, normal Patient's awareness of abnormal  movements (rate only patient's report): No Awareness, Dental Status Current problems with teeth and/or dentures?: No Does patient usually wear dentures?: No  CIWA:  CIWA-Ar Total: 1 COWS:  COWS Total Score: 2  Musculoskeletal: Strength & Muscle Tone: within normal limits Gait & Station: normal Patient leans: N/A  Psychiatric Specialty Exam: Physical Exam  Nursing note and vitals reviewed. Constitutional: He is oriented to person, place, and time. He appears well-developed and well-nourished.  Cardiovascular: Normal rate.  Respiratory: Effort normal.  Neurological: He is alert and oriented to person, place, and time.    Review of Systems  Constitutional: Negative.   Respiratory: Negative.   Psychiatric/Behavioral: Positive for substance abuse (heroin, BZDs, cocaine). Negative for depression, hallucinations, memory loss and  suicidal ideas. The patient is not nervous/anxious and does not have insomnia.     Blood pressure 107/80, pulse 97, temperature 98.2 F (36.8 C), temperature source Oral, resp. rate 16, height 5\' 11"  (1.803 m), weight 69.4 kg, SpO2 100 %.Body mass index is 21.34 kg/m.  General Appearance: Casual  Eye Contact:  Good  Speech:  Normal Rate  Volume:  Normal  Mood:  Euthymic  Affect:  Appropriate and Congruent  Thought Process:  Coherent  Orientation:  Full (Time, Place, and Person)  Thought Content:  WDL  Suicidal Thoughts:  No  Homicidal Thoughts:  No  Memory:  Immediate;   Fair  Judgement:  Intact  Insight:  Fair  Psychomotor Activity:  Normal  Concentration:  Concentration: Fair  Recall:  [employer] of Knowledge:  Fair  Language:  Good  Akathisia:  No  Handed:  Right  AIMS (if indicated):     Assets:  Communication Skills Housing Physical Health Social Support  ADL's:  Intact  Cognition:  WNL  Sleep:  Number of Hours: 6.25     Have you used any form of tobacco in the last 30 days? (Cigarettes, Smokeless Tobacco, Cigars, and/or Pipes): Yes  Has this patient used any form of tobacco in the last 30 days? (Cigarettes, Smokeless Tobacco, Cigars, and/or Pipes) Yes, a prescription for an FDA-approved medication for tobacco cessation was offered at discharge.   Blood Alcohol level:  Lab Results  Component Value Date   ETH <10 03/18/2019    Metabolic Disorder Labs:  No results found for: HGBA1C, MPG No results found for: PROLACTIN No results found for: CHOL, TRIG, HDL, CHOLHDL, VLDL, LDLCALC  See Psychiatric Specialty Exam and Suicide Risk Assessment completed by Attending Physician prior to discharge.  Discharge destination:  Home  Is patient on multiple antipsychotic therapies at discharge:  No   Has Patient had three or more failed trials of antipsychotic monotherapy by history:  No  Recommended Plan for Multiple Antipsychotic Therapies: NA  Discharge  Instructions    Discharge instructions   Complete by:  As directed    Patient is instructed to take all prescribed medications as recommended. Report any side effects or adverse reactions to your outpatient psychiatrist. Patient is instructed to abstain from alcohol and illegal drugs while on prescription medications. In the event of worsening symptoms, patient is instructed to call the crisis hotline, 911, or go to the nearest emergency department for evaluation and treatment.     Allergies as of 03/25/2019   No Known Allergies     Medication List    STOP taking these medications   amoxicillin-clavulanate 875-125 MG tablet Commonly known as:  AUGMENTIN     TAKE these medications     Indication  gabapentin  300 MG capsule Commonly known as:  NEURONTIN Take 1 capsule (300 mg total) by mouth 2 (two) times daily. For substance withdrawal  Indication:  Abuse or Misuse of Alcohol   hydrOXYzine 25 MG tablet Commonly known as:  ATARAX/VISTARIL Take 1 tablet (25 mg total) by mouth 3 (three) times daily as needed for anxiety.  Indication:  Feeling Anxious   loratadine 10 MG tablet Commonly known as:  CLARITIN Take 1 tablet (10 mg total) by mouth daily. Start taking on:  March 26, 2019  Indication:  Hayfever   memantine 10 MG tablet Commonly known as:  NAMENDA Take 1 tablet (10 mg total) by mouth 2 (two) times daily. For memory  Indication:  Memory deficit   mirtazapine 15 MG tablet Commonly known as:  REMERON Take 1 tablet (15 mg total) by mouth at bedtime. For mood/sleep  Indication:  Mood   nicotine 21 mg/24hr patch Commonly known as:  NICODERM CQ - dosed in mg/24 hours Place 1 patch (21 mg total) onto the skin daily. For smoking cessation Start taking on:  March 26, 2019  Indication:  Nicotine Addiction   omega-3 acid ethyl esters 1 g capsule Commonly known as:  LOVAZA Take 1 capsule (1 g total) by mouth 2 (two) times daily.  Indication:  Anoxic brain injury    traZODone 50 MG tablet Commonly known as:  DESYREL Take 1 tablet (50 mg total) by mouth at bedtime as needed for sleep.  Indication:  Trouble Sleeping      Follow-up Information    [facility] Follow up on 03/29/2019.   Why:  Hospital follow up appointment is Friday 4/10 at 9:00a.  At this time, the appointment will be held over the telephone.  Contact information: [address] [place] [address] [phone]           Follow-up recommendations: Activity as tolerated. Diet as recommended by primary care physician. Keep all scheduled follow-up appointments as recommended.   Comments:   Patient is instructed to take all prescribed medications as recommended. Report any side effects or adverse reactions to your outpatient psychiatrist. Patient is instructed to abstain from alcohol and illegal drugs while on prescription medications. In the event of worsening symptoms, patient is instructed to call the crisis hotline, 911, or go to the nearest emergency department for evaluation and treatment.  Signed: Aldean Baker, NP 03/25/2019, 11:37 AM

## 2019-03-25 NOTE — [facility] Suicide Risk Assessment (Signed)
[facility] Discharge Suicide Risk Assessment   Principal Problem: MDD (major depressive disorder), recurrent episode, severe (HCC) Discharge Diagnoses: Principal Problem:   MDD (major depressive disorder), recurrent episode, severe (HCC) Active Problems:   Opiate dependence (HCC)   Total Time spent with patient: 45 minutes Alert oriented generally stable in mood and affect no cravings tremors or withdrawal symptoms stable for release home  Mental Status Per Nursing Assessment::   On Admission:  NA  Demographic Factors:  Male/substance abuse  Loss Factors: NA  Historical Factors: NA  Risk Reduction Factors:   Sense of responsibility to family and Religious beliefs about death  Continued Clinical Symptoms:  Alcohol/Substance Abuse/Dependencies  Cognitive Features That Contribute To Risk:  None    Suicide Risk:  Minimal: No identifiable suicidal ideation.  Patients presenting with no risk factors but with morbid ruminations; may be classified as minimal risk based on the severity of the depressive symptoms  Follow-up Information    [facility] Follow up on 03/29/2019.   Why:  Hospital follow up appointment is Friday 4/10 at 9:00a.  At this time, the appointment will be held over the telephone.  Contact information: [address] [place] [address] [phone]           Plan Of Care/Follow-up recommendations:  Activity:  full  Naviah Belfield, MD 03/25/2019, 11:02 AM

## 2019-03-25 NOTE — Progress Notes (Signed)
Adult Psychoeducational Group Note  Date:  03/25/2019 Time:  1:38 PM  Group Topic/Focus:  Wellness Toolbox:   The focus of this group is to discuss various aspects of wellness, balancing those aspects and exploring ways to increase the ability to experience wellness.  Patients will create a wellness toolbox for use upon discharge.  Participation Level:  Active  Participation Quality:  Appropriate  Affect:  Appropriate  Cognitive:  Appropriate  Insight: Appropriate  Engagement in Group:  Engaged  Modes of Intervention:  Discussion  Additional Comments:  Pt attended group and participated in discussion.  Pt states that he is feeling better since having been to [facility] and wants to try to remain sober.  Tawsha Terrero R Lizmary Nader 03/25/2019, 1:38 PM

## 2019-03-25 NOTE — Progress Notes (Signed)
Recreation Therapy Notes  Date:  4.6.20 Time: 0930 Location: [address] Dayroom  Group Topic: Stress Management  Goal Area(s) Addresses:  Patient will identify positive stress management techniques. Patient will identify benefits of using stress management post d/c.  Behavioral Response:  Engaged  Intervention:  Stress Management  Activity :  Guided Imagery.  LRT introduced the stress management technique of guided imagery.  LRT read a script that guided patients in envisioning their peaceful place.  Patients were to listen and follow along as script was read to engage in activity.    Education:  Stress Management, Discharge Planning.   Education Outcome: Acknowledges Education  Clinical Observations/Feedback:  Pt attended and participated in group.    Caroll Rancher, LRT/CTRS         Caroll Rancher A 03/25/2019 10:38 AM

## 2019-03-25 NOTE — Progress Notes (Signed)
D:  Patient's self inventory sheet, patient sleeps good, sleep medication helpful.  Good appetite, normal energy level, good concentration.  Patient denied depression and hopeless, anxiety 3.  Denied withdrawals.  Denied SI.  Denied physical problems.  Denied physical pain.  Worst pain #1 in past 24 hours.  Goal is discharge plan.  Make sure I get aftercare and concentrate on why he is here.  Does have discharge plans. A:  Medications administered per MD orders.  Emotional support and encouragement given patient. R:  Denied SI and HI, contracts for safety.  Denied A/V hallucinations.  Safety maintained with 15 minute checks.

## 2019-03-26 ENCOUNTER — Encounter (HOSPITAL_COMMUNITY): Payer: Self-pay | Admitting: Emergency Medicine

## 2019-04-09 ENCOUNTER — Encounter: Payer: BLUE CROSS/BLUE SHIELD | Admitting: Family

## 2019-04-27 ENCOUNTER — Encounter (HOSPITAL_BASED_OUTPATIENT_CLINIC_OR_DEPARTMENT_OTHER): Payer: Self-pay | Admitting: Emergency Medicine

## 2019-04-27 ENCOUNTER — Other Ambulatory Visit: Payer: Self-pay

## 2019-04-27 ENCOUNTER — Emergency Department (HOSPITAL_BASED_OUTPATIENT_CLINIC_OR_DEPARTMENT_OTHER)
Admission: EM | Admit: 2019-04-27 | Discharge: 2019-04-27 | Disposition: A | Discharge status: Home / Self Care | Payer: BLUE CROSS/BLUE SHIELD | Attending: Emergency Medicine | Admitting: Emergency Medicine

## 2019-04-27 DIAGNOSIS — F17210 Nicotine dependence, cigarettes, uncomplicated: Secondary | ICD-10-CM | POA: Diagnosis not present

## 2019-04-27 DIAGNOSIS — Z79899 Other long term (current) drug therapy: Secondary | ICD-10-CM | POA: Diagnosis not present

## 2019-04-27 DIAGNOSIS — T50901A Poisoning by unspecified drugs, medicaments and biological substances, accidental (unintentional), initial encounter: Secondary | ICD-10-CM | POA: Diagnosis not present

## 2019-04-27 DIAGNOSIS — R4182 Altered mental status, unspecified: Secondary | ICD-10-CM | POA: Diagnosis present

## 2019-04-27 LAB — RAPID URINE DRUG SCREEN, HOSP PERFORMED
Amphetamines: POSITIVE — AB
Barbiturates: NOT DETECTED
Benzodiazepines: POSITIVE — AB
Cocaine: NOT DETECTED
Opiates: NOT DETECTED
Tetrahydrocannabinol: NOT DETECTED

## 2019-04-27 LAB — COMPREHENSIVE METABOLIC PANEL
ALT: 30 U/L (ref 0–44)
AST: 43 U/L — ABNORMAL HIGH (ref 15–41)
Albumin: 4.8 g/dL (ref 3.5–5.0)
Alkaline Phosphatase: 69 U/L (ref 38–126)
Anion gap: 7 (ref 5–15)
BUN: 12 mg/dL (ref 6–20)
CO2: 27 mmol/L (ref 22–32)
Calcium: 9.7 mg/dL (ref 8.9–10.3)
Chloride: 103 mmol/L (ref 98–111)
Creatinine, Ser: 0.86 mg/dL (ref 0.61–1.24)
GFR calc Af Amer: >60 mL/min (ref >60)
GFR calc non Af Amer: >60 mL/min (ref >60)
Glucose, Bld: 93 mg/dL (ref 70–99)
Potassium: 4.6 mmol/L (ref 3.5–5.1)
Sodium: 137 mmol/L (ref 135–145)
Total Bilirubin: 0.6 mg/dL (ref 0.3–1.2)
Total Protein: 7.8 g/dL (ref 6.5–8.1)

## 2019-04-27 LAB — CBC WITH DIFFERENTIAL/PLATELET
Abs Immature Granulocytes: 0.01 10*3/uL (ref 0.00–0.07)
Basophils Absolute: 0 10*3/uL (ref 0.0–0.1)
Basophils Relative: 1 %
Eosinophils Absolute: 0 10*3/uL (ref 0.0–0.5)
Eosinophils Relative: 1 %
HCT: 42.7 % (ref 39.0–52.0)
Hemoglobin: 13.9 g/dL (ref 13.0–17.0)
Immature Granulocytes: 0 %
Lymphocytes Relative: 37 %
Lymphs Abs: 1.6 10*3/uL (ref 0.7–4.0)
MCH: 29.6 pg (ref 26.0–34.0)
MCHC: 32.6 g/dL (ref 30.0–36.0)
MCV: 91 fL (ref 80.0–100.0)
Monocytes Absolute: 0.6 10*3/uL (ref 0.1–1.0)
Monocytes Relative: 13 %
Neutro Abs: 2.2 10*3/uL (ref 1.7–7.7)
Neutrophils Relative %: 48 %
Platelets: 331 10*3/uL (ref 150–400)
RBC: 4.69 MIL/uL (ref 4.22–5.81)
RDW: 12.3 % (ref 11.5–15.5)
WBC: 4.4 10*3/uL (ref 4.0–10.5)
nRBC: 0 % (ref 0.0–0.2)

## 2019-04-27 LAB — ETHANOL: Alcohol, Ethyl (B): <10 mg/dL (ref <10)

## 2019-04-27 LAB — ACETAMINOPHEN LEVEL: Acetaminophen (Tylenol), Serum: <10 ug/mL — ABNORMAL LOW (ref 10–30)

## 2019-04-27 LAB — SALICYLATE LEVEL: Salicylate Lvl: <7 mg/dL (ref 2.8–30.0)

## 2019-04-27 NOTE — ED Notes (Signed)
Pt remains confused to time, is oriented to person and place. Pt discharged home in care of his brother. Pt instructed to f/u with his substance abuse counselor.

## 2019-04-27 NOTE — ED Provider Notes (Signed)
[facility] EMERGENCY DEPARTMENT Provider Note   CSN: [id number] Arrival date & time: 04/27/19  6295    History   Chief Complaint Chief Complaint  Patient presents with  . Drug Overdose    HPI Darvis Croft is a 28 y.o. male.     Patient is a 28 year old male with a known history of substance abuse who presents after a probable overdose.  He was brought in by EMS from home.  His girlfriend called because he was not responding.  Reportedly he took ecstasy Xanax and had some alcohol use as well.  There was a question of some heroin use as well.  EMS did give the patient Narcan with some improvement in his level of consciousness.  History is limited because he is very sleepy and noncommunicative on arrival.     Past Medical History:  Diagnosis Date  . Anxiety   . Depressed   . Depression   . Heroin abuse (HCC)   . Opiate addiction (HCC)   . Seizures (HCC)    childhood  . Substance abuse (HCC)    History abuse hx. Clean for 6 months.    Patient Active Problem List   Diagnosis Date Noted  . Opiate dependence (HCC) 03/22/2019  . MDD (major depressive disorder), recurrent episode, severe (HCC) 03/22/2019  . Suicide attempt (HCC)   . Acute respiratory failure with hypoxia (HCC) 03/18/2019  . Drug overdose 03/18/2019  . Encephalopathy 08/18/2017  . Altered mental status   . Polysubstance overdose 08/15/2017  . Rhabdomyolysis 08/15/2017  . Elevated LFTs 08/14/2017  . Fever 08/14/2017  . Drug overdose, intentional (HCC) 12/03/2015  . Encephalopathy, toxic 12/03/2015    Past Surgical History:  Procedure Laterality Date  . NO PAST SURGERIES          Home Medications    Prior to Admission medications   Medication Sig Start Date End Date Taking? Authorizing Provider  diphenhydrAMINE (BENADRYL) 25 mg capsule Take 25 mg by mouth every 6 (six) hours as needed for allergies.    [provider]  gabapentin (NEURONTIN) 300 MG capsule Take 600 mg by  mouth 3 (three) times daily.     [provider]  gabapentin (NEURONTIN) 300 MG capsule Take 1 capsule (300 mg total) by mouth 2 (two) times daily. For substance withdrawal 03/25/19   Aldean Baker, NP  hydrOXYzine (ATARAX/VISTARIL) 25 MG tablet Take 1 tablet (25 mg total) by mouth 3 (three) times daily as needed for anxiety. 03/25/19   Aldean Baker, NP  ibuprofen (ADVIL,MOTRIN) 200 MG tablet Take 200 mg by mouth every 6 (six) hours as needed for moderate pain.    [provider]  loratadine (CLARITIN) 10 MG tablet Take 1 tablet (10 mg total) by mouth daily. 03/26/19   Aldean Baker, NP  magnesium oxide (MAG-OX) 400 (241.3 Mg) MG tablet Take 1 tablet (400 mg total) by mouth daily. Patient not taking: Reported on 11/21/2017 08/17/17   Dorothea Ogle, MD  memantine (NAMENDA) 10 MG tablet Take 1 tablet (10 mg total) by mouth 2 (two) times daily. For memory 03/25/19   Aldean Baker, NP  mirtazapine (REMERON) 15 MG tablet Take 1 tablet (15 mg total) by mouth at bedtime. Patient not taking: Reported on 04/12/2018 08/21/17   Noralee Stain, DO  mirtazapine (REMERON) 15 MG tablet Take 1 tablet (15 mg total) by mouth at bedtime. For mood/sleep 03/25/19   Aldean Baker, NP  nicotine (NICODERM CQ - DOSED IN  MG/24 HOURS) 21 mg/24hr patch Place 1 patch (21 mg total) onto the skin daily. For smoking cessation 03/26/19   Aldean Baker, NP  omega-3 acid ethyl esters (LOVAZA) 1 g capsule Take 1 capsule (1 g total) by mouth 2 (two) times daily. 03/25/19   Aldean Baker, NP  sertraline (ZOLOFT) 50 MG tablet Take 3 tablets (150 mg total) by mouth daily. Patient taking differently: Take 200 mg by mouth daily.  08/21/17   Noralee Stain, DO  traZODone (DESYREL) 50 MG tablet Take 1 tablet (50 mg total) by mouth at bedtime as needed for sleep. 03/25/19   Aldean Baker, NP    Family History Family History  Family history unknown: Yes    Social History Social History   Tobacco Use  . Smoking status: Current  Every Day Smoker    Packs/day: 0.50    Types: Cigarettes  . Smokeless tobacco: Never Used  Substance Use Topics  . Alcohol use: Not Currently  . Drug use: Yes    Types: Heroin, Benzodiazepines, IV, Hydrocodone, Oxycodone    Comment: Heroin      Allergies   Patient has no known allergies.   Review of Systems Review of Systems  Unable to perform ROS: Mental status change     Physical Exam Updated Vital Signs BP (!) 130/93   Pulse 62   Temp (!) 97.3 F (36.3 C) (Axillary)   Resp 16   Ht 5\' 8"  (1.727 m)   Wt 68 kg   SpO2 100%   BMI 22.81 kg/m   Physical Exam Constitutional:      Appearance: He is well-developed.     Comments: Patient is sleepy but will localize to painful stimuli  HENT:     Head: Normocephalic and atraumatic.  Eyes:     Pupils: Pupils are equal, round, and reactive to light.     Comments: Pupils midpoint and reactive bilaterally  Neck:     Musculoskeletal: Normal range of motion and neck supple.  Cardiovascular:     Rate and Rhythm: Regular rhythm. Bradycardia present.     Heart sounds: Normal heart sounds.  Pulmonary:     Effort: Pulmonary effort is normal. No respiratory distress.     Breath sounds: Normal breath sounds. No wheezing or rales.  Chest:     Chest wall: No tenderness.  Abdominal:     General: Bowel sounds are normal.     Palpations: Abdomen is soft.     Tenderness: There is no abdominal tenderness. There is no guarding or rebound.  Musculoskeletal: Normal range of motion.     Comments: No clonus  Lymphadenopathy:     Cervical: No cervical adenopathy.  Skin:    General: Skin is warm and dry.     Findings: No rash.  Neurological:     Comments: Patient is sleepy but will localize to painful stimuli, gag reflex is present.  He does have a normal cough reflex as well.  He is moving all extremities      ED Treatments / Results  Labs (all labs ordered are listed, but only abnormal results are displayed) Labs Reviewed   COMPREHENSIVE METABOLIC PANEL - Abnormal; Notable for the following components:      Result Value   AST 43 (*)    All other components within normal limits  RAPID URINE DRUG SCREEN, HOSP PERFORMED - Abnormal; Notable for the following components:   Benzodiazepines POSITIVE (*)    Amphetamines POSITIVE (*)  All other components within normal limits  ACETAMINOPHEN LEVEL - Abnormal; Notable for the following components:   Acetaminophen (Tylenol), Serum <10 (*)    All other components within normal limits  CBC WITH DIFFERENTIAL/PLATELET  ETHANOL  SALICYLATE LEVEL    EKG EKG Interpretation  Date/Time:  Saturday Apr 27 2019 08:51:42 EDT Ventricular Rate:  69 PR Interval:    QRS Duration: 112 QT Interval:  437 QTC Calculation: 469 R Axis:   75 Text Interpretation:  Sinus rhythm Probable left ventricular hypertrophy Abnrm T, consider ischemia, anterolateral lds Confirmed by Rolan BuccoBelfi, Dominica Kent [phone] on 04/27/2019 8:55:13 AM   Radiology No results found.  Procedures Procedures (including critical care time)  Medications Ordered in ED Medications - No data to display   Initial Impression / Assessment and Plan / ED Course  I have reviewed the triage vital signs and the nursing notes.  Pertinent labs & imaging results that were available during my care of the patient were reviewed by me and considered in my medical decision making (see chart for details).  Clinical Course as of Apr 26 1428  Sat Apr 27, 2019  1106 PT waking up more.  Is more coherent and starting to answer questions.    [MB]  1343 Pt continuing to improve, is coherently answering questions, but still not fully oriented.  Is ambulating but still a little unsteady.  Will need further observation.  Given his degree of improvement, I don't feel that he needs a head CT at this point   [MB]    Clinical Course User Index [MB] Rolan BuccoBelfi, Emory Leaver, MD       Pt presents after reported ingestion of possible ecstasy and  benzo.  Labs non-concerning.  ETOH neg.  Pt has markedly improved since arrival, but still has some mild confusion.  Once oriented completely, and able to ambulate unassisted, will be able to be discharged.  Pt denies any SI.  Care turned over to Dr. Judd Lienelo.  Final Clinical Impressions(s) / ED Diagnoses   Final diagnoses:  Accidental drug overdose, initial encounter    ED Discharge Orders    None       Rolan BuccoBelfi, Jeannene Tschetter, MD 04/27/19 1429

## 2019-04-27 NOTE — Discharge Instructions (Addendum)
Follow-up with your NA representatives, and return to the ER if you experience any new and concerning issues.

## 2019-04-27 NOTE — ED Notes (Signed)
Pt remains confused and is wondering around the room at this time. Pt refuses to settle in the bed.

## 2019-04-27 NOTE — ED Notes (Signed)
EMT at bedside at this time to sit with pt.

## 2019-04-27 NOTE — ED Notes (Signed)
Received TC from brother Benjiman Core.  He was wanting to speak to patient.  Informed him that pt was stable and that he was unable to speak on the phone at this time as he was not alert enough to communicate well.  He understood and gave phone number to call when patient is more alert.  [phone]

## 2019-04-27 NOTE — ED Notes (Signed)
Pt moving in bed, restlessly.  Pt assisted to stand at bedside in order to void with 2 staff assist.  Pt used urinal without difficulty.  Pt will respond to verbal questions but stares off in space, speech incomprehensible.  Pt is drooling but airway intact.  Able to cough and swallow.  No respiratory distress noted.

## 2019-04-27 NOTE — ED Provider Notes (Signed)
Care assumed from Dr. Fredderick Phenix at shift change.  Patient using unknown illicit drugs yesterday evening into this morning.  He was brought here with decreased responsiveness and observed for many hours.  His brother has now come to pick him up.  The patient is feeling better, but still feels somewhat weak and is slightly unsteady on his feet.  His brother is comfortable taking him home and I feel as though this is appropriate.  They will return for any problems.  Patient's tox screen was positive for amphetamines and benzodiazepines.   Geoffery Lyons, MD 04/27/19 [phone]

## 2019-04-27 NOTE — ED Notes (Signed)
Spoke to cousin Cammie Sickle on the phone.  She was the person who called 911 and had him brought to ED.  She relates that she found some unknown drugs at his house when she came to care for his dogs.  She was concerned that he wasn't "breathing well" and called 911.  She asked to be updated if possible.  [phone].
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
## 2019-04-27 NOTE — ED Notes (Signed)
Staffing advised no sitter available until 7pm

## 2019-04-27 NOTE — ED Notes (Signed)
Pt is more awake, talking to EDP and this nurse.  Pt is oriented to self but is not able to answer other questions.  Knows he is in the hospital but thinks he is a Lawyer.  Pt speaking some sentences but unable to make sense of them.  Will continue to monitor.   Pt denies need for elimination.  Refuses po intake at this time.  Pt given TV remote

## 2019-04-27 NOTE — ED Triage Notes (Signed)
Per EMS:  Pt took ecstasy, etoh, xanax.  Pt was given narcan and started to awaken.  Girlfriend call due to decreased loc.  Pt is sleepy, arouses with some verbal and painful stimulil.

## 2019-04-27 NOTE — ED Notes (Signed)
Staffing called for safety sitter.  

## 2020-10-13 ENCOUNTER — Other Ambulatory Visit: Payer: Self-pay

## 2020-10-13 ENCOUNTER — Other Ambulatory Visit (HOSPITAL_COMMUNITY): Payer: Self-pay | Admitting: Psychiatry

## 2020-10-13 ENCOUNTER — Telehealth (INDEPENDENT_AMBULATORY_CARE_PROVIDER_SITE_OTHER): Payer: No Payment, Other | Admitting: Psychiatry

## 2020-10-13 ENCOUNTER — Encounter (HOSPITAL_COMMUNITY): Payer: Self-pay | Admitting: Psychiatry

## 2020-10-13 DIAGNOSIS — F411 Generalized anxiety disorder: Secondary | ICD-10-CM | POA: Diagnosis not present

## 2020-10-13 DIAGNOSIS — F3341 Major depressive disorder, recurrent, in partial remission: Secondary | ICD-10-CM | POA: Diagnosis not present

## 2020-10-13 DIAGNOSIS — F39 Unspecified mood [affective] disorder: Secondary | ICD-10-CM | POA: Diagnosis not present

## 2020-10-13 DIAGNOSIS — F1994 Other psychoactive substance use, unspecified with psychoactive substance-induced mood disorder: Secondary | ICD-10-CM | POA: Insufficient documentation

## 2020-10-13 MED ORDER — SERTRALINE HCL 25 MG PO TABS: 25.0000 mg | ORAL_TABLET | Freq: Every day | ORAL | 0 refills | Status: DC

## 2020-10-13 MED ORDER — GABAPENTIN 300 MG PO CAPS: 600.0000 mg | ORAL_CAPSULE | Freq: Three times a day (TID) | ORAL | 2 refills | Status: DC

## 2020-10-13 MED ORDER — MIRTAZAPINE 15 MG PO TABS: 15.0000 mg | ORAL_TABLET | Freq: Every day | ORAL | 2 refills | Status: DC

## 2020-10-13 MED ORDER — HYDROXYZINE HCL 25 MG PO TABS: 25.0000 mg | ORAL_TABLET | Freq: Three times a day (TID) | ORAL | 2 refills | Status: DC | PRN

## 2020-10-13 MED ORDER — PROPRANOLOL HCL 10 MG PO TABS: 10.0000 mg | ORAL_TABLET | Freq: Three times a day (TID) | ORAL | 2 refills | Status: DC
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
## 2020-11-16 ENCOUNTER — Telehealth (HOSPITAL_COMMUNITY): Payer: Self-pay | Admitting: *Deleted

## 2020-11-16 NOTE — Telephone Encounter (Signed)
Call from patient stating none of his meds are at [place] where they were supposed to be called into and he also wants to speak with the provider to continue to stay on his Zoloft at 50 mg a day, states this is not the right time for him to go off of it, and secondly wants to have his Inderal increased, he is taking it TID but sometimes feels he needs to take more because he feels "shakey" [employer] to check on the medications as our chart indicates they are all there and were called in initially on 10/24. Spoke with Marcelino Duster at the pharmacy and he had requested all of them be transferred to [facility] in [place]. Will bring his concern for the Inderal and Zoloft to providers attention.

## 2020-11-18 ENCOUNTER — Other Ambulatory Visit (HOSPITAL_COMMUNITY): Payer: Self-pay | Admitting: Psychiatry

## 2020-11-18 DIAGNOSIS — F3341 Major depressive disorder, recurrent, in partial remission: Secondary | ICD-10-CM

## 2020-11-18 DIAGNOSIS — F411 Generalized anxiety disorder: Secondary | ICD-10-CM

## 2020-11-18 MED ORDER — PROPRANOLOL HCL 10 MG PO TABS: 10.0000 mg | ORAL_TABLET | Freq: Four times a day (QID) | ORAL | 2 refills | Status: DC

## 2020-11-18 MED ORDER — SERTRALINE HCL 50 MG PO TABS: 50.0000 mg | ORAL_TABLET | Freq: Every day | ORAL | 2 refills | Status: DC

## 2020-11-18 NOTE — Telephone Encounter (Signed)
Patient informed Clinical research associate that he has been more depressed and anxious since reducing Zoloft. He informed provider that he would like to go back to his original dose of 50 mg. Provider endorsed understanding and agreed to increase to the dose. Patient also informed provider that he takes propanolol three times daily for anxiety however reports that at time he has tremors when he does not take the medication. He is agreeable to increase propanolol 10 mg three times daily to propanolol 10 mg four times daily to help manage anxiety and tremors. No other concerns noted at this time.

## 2021-01-13 ENCOUNTER — Telehealth (HOSPITAL_COMMUNITY): Payer: No Payment, Other | Admitting: Psychiatry

## 2021-02-11 ENCOUNTER — Encounter (HOSPITAL_COMMUNITY): Payer: Self-pay | Admitting: Psychiatry

## 2021-02-11 ENCOUNTER — Other Ambulatory Visit: Payer: Self-pay

## 2021-02-11 ENCOUNTER — Telehealth (INDEPENDENT_AMBULATORY_CARE_PROVIDER_SITE_OTHER): Payer: No Payment, Other | Admitting: Psychiatry

## 2021-02-11 DIAGNOSIS — F3341 Major depressive disorder, recurrent, in partial remission: Secondary | ICD-10-CM

## 2021-02-11 DIAGNOSIS — F39 Unspecified mood [affective] disorder: Secondary | ICD-10-CM

## 2021-02-11 DIAGNOSIS — F411 Generalized anxiety disorder: Secondary | ICD-10-CM | POA: Diagnosis not present

## 2021-02-11 MED ORDER — SERTRALINE HCL 50 MG PO TABS: 50.0000 mg | ORAL_TABLET | Freq: Every day | ORAL | 2 refills | Status: DC

## 2021-02-11 MED ORDER — HYDROXYZINE HCL 25 MG PO TABS: 25.0000 mg | ORAL_TABLET | Freq: Three times a day (TID) | ORAL | 2 refills | Status: DC | PRN

## 2021-02-11 MED ORDER — PROPRANOLOL HCL 10 MG PO TABS: 10.0000 mg | ORAL_TABLET | Freq: Four times a day (QID) | ORAL | 2 refills | Status: DC

## 2021-02-11 MED ORDER — GABAPENTIN 300 MG PO CAPS: 600.0000 mg | ORAL_CAPSULE | Freq: Three times a day (TID) | ORAL | 2 refills | Status: DC

## 2021-02-11 MED ORDER — MIRTAZAPINE 30 MG PO TABS: 30.0000 mg | ORAL_TABLET | Freq: Every day | ORAL | 2 refills | Status: DC
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
## 2021-02-23 ENCOUNTER — Ambulatory Visit (HOSPITAL_COMMUNITY): Payer: No Payment, Other | Admitting: Professional

## 2021-03-04 ENCOUNTER — Ambulatory Visit (HOSPITAL_COMMUNITY): Payer: No Payment, Other | Admitting: Professional

## 2021-03-04 ENCOUNTER — Other Ambulatory Visit: Payer: Self-pay

## 2021-03-04 ENCOUNTER — Telehealth (HOSPITAL_COMMUNITY): Payer: Self-pay | Admitting: Professional

## 2021-03-04 NOTE — Telephone Encounter (Signed)
See call log 

## 2021-03-17 ENCOUNTER — Other Ambulatory Visit: Payer: Self-pay

## 2021-03-17 ENCOUNTER — Ambulatory Visit (HOSPITAL_COMMUNITY): Payer: No Payment, Other | Admitting: Professional

## 2021-03-17 ENCOUNTER — Telehealth (HOSPITAL_COMMUNITY): Payer: Self-pay | Admitting: Professional

## 2021-03-17 NOTE — Telephone Encounter (Signed)
Cln calls pt due to pt not being in virtual apt. Pt forgot about appointment. Pt reports he is still at work but can do the call. Cln agrees until learns pt is driving at this time; cln unable to do apt while pt is driving. Pt understands. Cln will have admin staff reach out to pt to resch. Pt agrees. Pt denies SI/HI/AVH.

## 2021-04-05 ENCOUNTER — Other Ambulatory Visit (HOSPITAL_COMMUNITY): Payer: Self-pay | Admitting: Psychiatry

## 2021-04-05 DIAGNOSIS — F411 Generalized anxiety disorder: Secondary | ICD-10-CM

## 2021-04-05 DIAGNOSIS — F3341 Major depressive disorder, recurrent, in partial remission: Secondary | ICD-10-CM

## 2021-05-02 ENCOUNTER — Other Ambulatory Visit (HOSPITAL_COMMUNITY): Payer: Self-pay | Admitting: Psychiatry

## 2021-05-02 DIAGNOSIS — F3341 Major depressive disorder, recurrent, in partial remission: Secondary | ICD-10-CM

## 2021-05-02 DIAGNOSIS — F411 Generalized anxiety disorder: Secondary | ICD-10-CM

## 2021-05-06 ENCOUNTER — Telehealth (INDEPENDENT_AMBULATORY_CARE_PROVIDER_SITE_OTHER): Payer: No Payment, Other | Admitting: Psychiatry

## 2021-05-06 ENCOUNTER — Encounter (HOSPITAL_COMMUNITY): Payer: Self-pay | Admitting: Psychiatry

## 2021-05-06 ENCOUNTER — Other Ambulatory Visit: Payer: Self-pay

## 2021-05-06 DIAGNOSIS — F3341 Major depressive disorder, recurrent, in partial remission: Secondary | ICD-10-CM | POA: Diagnosis not present

## 2021-05-06 DIAGNOSIS — F39 Unspecified mood [affective] disorder: Secondary | ICD-10-CM

## 2021-05-06 DIAGNOSIS — F411 Generalized anxiety disorder: Secondary | ICD-10-CM | POA: Diagnosis not present

## 2021-05-06 MED ORDER — HYDROXYZINE HCL 25 MG PO TABS: 25.0000 mg | ORAL_TABLET | Freq: Three times a day (TID) | ORAL | 2 refills | Status: DC | PRN

## 2021-05-06 MED ORDER — PROPRANOLOL HCL 10 MG PO TABS: 10.0000 mg | ORAL_TABLET | Freq: Four times a day (QID) | ORAL | 2 refills | Status: DC

## 2021-05-06 MED ORDER — GABAPENTIN 300 MG PO CAPS: 600.0000 mg | ORAL_CAPSULE | Freq: Three times a day (TID) | ORAL | 2 refills | Status: DC

## 2021-05-06 MED ORDER — SERTRALINE HCL 50 MG PO TABS: 1.0000 | ORAL_TABLET | Freq: Every day | ORAL | 2 refills | Status: DC

## 2021-05-06 MED ORDER — MIRTAZAPINE 30 MG PO TABS: ORAL_TABLET | ORAL | 2 refills | Status: DC

## 2021-05-06 NOTE — Progress Notes (Signed)
BH MD/PA/NP OP Progress Note Virtual Visit via Telephone Note  I connected with Mark Strickland on 05/06/21 at  2:30 PM EDT by telephone and verified that I am speaking with the correct person using two identifiers.  Location: Patient: home Provider: Clinic   I discussed the limitations, risks, security and privacy concerns of performing an evaluation and management service by telephone and the availability of in person appointments. I also discussed with the patient that there may be a patient responsible charge related to this service. The patient expressed understanding and agreed to proceed.   I provided 30 minutes of non-face-to-face time during this encounter.   05/06/2021 3:00 PM Mark Strickland  MRN:  [id number]  Chief Complaint: "My anxiety is worse"  HPI: 30 year old male seen today for follow up psychiatric evaluation. He has a psychiatric history of depression, anxiety, substance induced mood disorder, suicide attempt, and opioid use disorder (in remission). He is currently being managed on Zoloft 50mg  QD, Gabapentin 600mg  TID, Propranolol 10mg  BID, Hydroxyzine 25mg  TID, Remeron 15mg  at night.   Today he was unable to logon virtually so his test was done over the phone.  During exam he was pleasant, cooperative, and engaged in conversation. He informed provider that he feels his anxiety has gotten worse due to social stressors.  He notes that he worries about finances, family, and sometimes nothing at all.  Provider conducted a GAD-7 and patient scored a 9, at his last visit patient scored a 17.  Provider discussed the scores with patient and he notes that his anxiety is centered around social stressors.  Provider also conducted a GAD-7 and patient scored a 6, at his last visit he scored 18.  He notes that his sleep at times is disturbed because he is restless and anxious.  He informed provider however that he sleeps 6 to 7 hours nightly.  He endorses having an adequate appetite.   Today he denies SI/HI/VAH, mania, or paranoia.   Patient notes that he does not want his medications adjusted but may be interested in starting another medication as needed to help with his anxiety.  Provider offered hydroxyzine however he notes that it was ineffective.  Provider offered BuSpar however he notes that this too was ineffective.  At this time provider notes that a benzo would not be prescribed.  He endorsed understanding and notes that he does not want anything to jeopardize his sobriety.  Provider recommended patient following up with his therapist.  He notes that his therapist is on maternity leave.  Provider had administrative staff schedule patient with another therapist at [facility].  At this time no medication adjustments made.  Patient will continue all other medications as prescribed.  No other concerns at this time    Visit Diagnosis:    ICD-10-CM   1. Generalized anxiety disorder  F41.1 gabapentin (NEURONTIN) 300 MG capsule    hydrOXYzine (ATARAX/VISTARIL) 25 MG tablet    mirtazapine (REMERON) 30 MG tablet    sertraline (ZOLOFT) 50 MG tablet    propranolol (INDERAL) 10 MG tablet  2. 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 Recurrent major depressive disorder, in partial remission (HCC)  F33.41 mirtazapine (REMERON) 30 MG tablet    sertraline (ZOLOFT) 50 MG tablet    Past Psychiatric History: depression, anxiety, substance induced mood disorder, suicide attempt, and opioid use disorder  Past Medical History:  Past Medical History:  Diagnosis Date  . Anxiety   . Depressed   .  Depression   . Heroin abuse (HCC)   . Opiate addiction (HCC)   . Seizures (HCC)    childhood  . Substance abuse (HCC)    History abuse hx. Clean for 6 months.    Past Surgical History:  Procedure Laterality Date  . NO PAST SURGERIES      Family Psychiatric History: Anxiety, depression, Substance induced mood disorder (opiates)  Family History:   Family History  Family history unknown: Yes    Social History:  Social History   Socioeconomic History  . Marital status: Single    Spouse name: Not on file  . Number of children: Not on file  . Years of education: Not on file  . Highest education level: Not on file  Occupational History  . Occupation: "treework"  Tobacco Use  . Smoking status: Current Every Day Smoker    Packs/day: 0.50    Types: Cigarettes  . Smokeless tobacco: Never Used  Vaping Use  . Vaping Use: Never used  Substance and Sexual Activity  . Alcohol use: Not Currently  . Drug use: Yes    Types: Heroin, Benzodiazepines, IV, Hydrocodone, Oxycodone    Comment: Heroin   . Sexual activity: Yes  Other Topics Concern  . Not on file  Social History Narrative   ** Merged History Encounter **       Social Determinants of Health   Financial Resource Strain: Not on file  Food Insecurity: Not on file  Transportation Needs: Not on file  Physical Activity: Not on file  Stress: Not on file  Social Connections: Not on file    Allergies: No Known Allergies  Metabolic Disorder Labs: No results found for: HGBA1C, MPG No results found for: PROLACTIN No results found for: CHOL, TRIG, HDL, CHOLHDL, VLDL, LDLCALC Lab Results  Component Value Date   TSH 0.320 (L) 08/18/2017   TSH 0.055 (L) 08/15/2017    Therapeutic Level Labs: No results found for: LITHIUM No results found for: VALPROATE No components found for:  CBMZ  Current Medications: Current Outpatient Medications  Medication Sig Dispense Refill  . gabapentin (NEURONTIN) 300 MG capsule Take 2 capsules (600 mg total) by mouth 3 (three) times daily. 180 capsule 2  . hydrOXYzine (ATARAX/VISTARIL) 25 MG tablet Take 1 tablet (25 mg total) by mouth 3 (three) times daily as needed for anxiety. 90 tablet 2  . ibuprofen (ADVIL,MOTRIN) 200 MG tablet Take 200 mg by mouth every 6 (six) hours as needed for moderate pain.    Marland Kitchen. loratadine (CLARITIN) 10 MG tablet  Take 1 tablet (10 mg total) by mouth daily.    . magnesium oxide (MAG-OX) 400 (241.3 Mg) MG tablet Take 1 tablet (400 mg total) by mouth daily. (Patient not taking: Reported on 11/21/2017) 7 tablet 0  . mirtazapine (REMERON) 30 MG tablet TAKE ONE TABLET BY MOUTH AT BEDTIME FOR MOOD/SLEEP 30 tablet 2  . omega-3 acid ethyl esters (LOVAZA) 1 g capsule Take 1 capsule (1 g total) by mouth 2 (two) times daily.    . propranolol (INDERAL) 10 MG tablet Take 1 tablet (10 mg total) by mouth 4 (four) times daily. 120 tablet 2  . sertraline (ZOLOFT) 50 MG tablet Take 1 tablet (50 mg total) by mouth daily. 30 tablet 2   No current facility-administered medications for this visit.     Musculoskeletal: Strength & Muscle Tone: Unable to assess due to telephone visit Gait & Station: Unable to assess due to telephone visit Patient leans: N/A  Psychiatric Specialty  Exam: Review of Systems  There were no vitals taken for this visit.There is no height or weight on file to calculate BMI.  General Appearance: Unable to assess due to telephone visit  Eye Contact:  Unable to assess due to telephone visit  Speech:  Clear and Coherent and Normal Rate  Volume:  Normal  Mood:  Euthymic and Notes that he becomes anxious and depressed due to life stressors  Affect:  Appropriate and Congruent  Thought Process:  Coherent, Goal Directed and Linear  Orientation:  Full (Time, Place, and Person)  Thought Content: WDL and Logical   Suicidal Thoughts:  No  Homicidal Thoughts:  No  Memory:  Immediate;   Good Recent;   Good Remote;   Good  Judgement:  Good  Insight:  Good  Psychomotor Activity:  Unable to assess due to telephone visit  Concentration:  Concentration: Good and Attention Span: Good  Recall:  Good  Fund of Knowledge: Good  Language: Good  Akathisia:  No  Handed:  Right  AIMS (if indicated): Not done  Assets:  Communication Skills Desire for Improvement Financial Resources/Insurance Housing Social  Support  ADL's:  Intact  Cognition: WNL  Sleep:  Fair   Screenings: AIMS   Flowsheet [facility]  AIMS Total Score 0    AUDIT   Flowsheet Row [facility]  Alcohol Use Disorder Identification Test Final Score (AUDIT) 0    GAD-7   Flowsheet Row Video Visit from 05/06/2021 in [facility] Video Visit from 02/11/2021 in [facility] Video Visit from 10/13/2020 in [facility]  Total GAD-7 Score 9 17 11     PHQ2-9   Flowsheet Row Video Visit from 05/06/2021 in [facility] Video Visit from 02/11/2021 in [facility]  PHQ-2 Total Score 1 6  PHQ-9 Total Score 6 18    Flowsheet Row Video Visit from 05/06/2021 in [facility] Video Visit from 02/11/2021 in [facility]charged[facility]  C-SSRS RISK CATEGORY No Risk No Risk No Risk       Assessment and Plan: Patient notes that his anxiety and his depression has improved since his last visit however notes that life stressors exacerbates them.  Patient notes that he does not want his medications adjusted at this time.  He was offered BuSpar and hydroxyzine to help manage anxiety however he notes that he does not want to start that.  Provider informed patient that a benzodiazepine would not be prescribed at this time.  He endorsed understanding.  Provider referred patient to outpatient counseling for therapy.  No medication changes made today.  Patient agreeable to continue medication as prescribed.    1. Generalized anxiety disorder  Continue- gabapentin (NEURONTIN) 300 MG capsule; Take 2 capsules (600 mg total) by mouth 3 (three) times daily.   Dispense: 180 capsule; Refill: 2 Continue- hydrOXYzine (ATARAX/VISTARIL) 25 MG tablet; Take 1 tablet (25 mg total) by mouth 3 (three) times daily as needed for anxiety.  Dispense: 90 tablet; Refill: 2 Increased- mirtazapine (REMERON) 30 MG tablet; Take 1 tablet (30 mg total) by mouth at bedtime. For mood/sleep  Dispense: 30 tablet; Refill: 2 Continue- propranolol (INDERAL) 10 MG tablet; Take 1 tablet (10 mg total) by mouth 4 (four) times  daily.  Dispense: 120 tablet; Refill: 2 Continue- sertraline (ZOLOFT) 50 MG tablet; Take 1 tablet (50 mg total) by mouth daily.  Dispense: 30 tablet; Refill: 2  2. 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  Dispense: 180 capsule; Refill: 2  3. Recurrent major depressive disorder, in partial remission (HCC)  Continue- mirtazapine (REMERON) 30 MG tablet; Take 1 tablet (30 mg total) by mouth at bedtime. For mood/sleep  Dispense: 30 tablet; Refill: 2 Continue- sertraline (ZOLOFT) 50 MG tablet; Take 1 tablet (50 mg total) by mouth daily.  Dispense: 30 tablet; Refill: 2  Follow-up in 3 months Follow-up with therapy Shanna Cisco, NP 05/06/2021, 3:00 PM

## 2021-07-07 ENCOUNTER — Other Ambulatory Visit: Payer: Self-pay

## 2021-07-07 ENCOUNTER — Telehealth (HOSPITAL_COMMUNITY): Payer: Self-pay | Admitting: Licensed Clinical Social Worker

## 2021-07-07 ENCOUNTER — Ambulatory Visit (HOSPITAL_COMMUNITY): Payer: Self-pay | Admitting: Licensed Clinical Social Worker

## 2021-07-07 NOTE — Telephone Encounter (Signed)
LCSW sent text message with link for video session per schedule. LCSW remained avail online for session until 2:15. Pt failed to sign on for session.

## 2021-09-09 ENCOUNTER — Other Ambulatory Visit (HOSPITAL_COMMUNITY): Payer: Self-pay | Admitting: Psychiatry

## 2021-09-09 DIAGNOSIS — F3341 Major depressive disorder, recurrent, in partial remission: Secondary | ICD-10-CM

## 2021-09-09 DIAGNOSIS — F411 Generalized anxiety disorder: Secondary | ICD-10-CM

## 2021-10-08 ENCOUNTER — Other Ambulatory Visit (HOSPITAL_COMMUNITY): Payer: Self-pay | Admitting: Psychiatry

## 2021-10-08 DIAGNOSIS — F411 Generalized anxiety disorder: Secondary | ICD-10-CM

## 2021-10-08 DIAGNOSIS — F39 Unspecified mood [affective] disorder: Secondary | ICD-10-CM

## 2022-01-26 ENCOUNTER — Other Ambulatory Visit (HOSPITAL_COMMUNITY): Payer: Self-pay | Admitting: Psychiatry

## 2022-01-26 DIAGNOSIS — F39 Unspecified mood [affective] disorder: Secondary | ICD-10-CM

## 2022-01-26 DIAGNOSIS — F411 Generalized anxiety disorder: Secondary | ICD-10-CM

## 2022-01-27 ENCOUNTER — Telehealth (INDEPENDENT_AMBULATORY_CARE_PROVIDER_SITE_OTHER): Payer: No Payment, Other | Admitting: Psychiatry

## 2022-01-27 DIAGNOSIS — F3341 Major depressive disorder, recurrent, in partial remission: Secondary | ICD-10-CM

## 2022-01-27 DIAGNOSIS — F411 Generalized anxiety disorder: Secondary | ICD-10-CM

## 2022-01-27 DIAGNOSIS — F39 Unspecified mood [affective] disorder: Secondary | ICD-10-CM

## 2022-01-27 MED ORDER — HYDROXYZINE HCL 25 MG PO TABS: 25.0000 mg | ORAL_TABLET | Freq: Three times a day (TID) | ORAL | 2 refills | Status: DC | PRN

## 2022-01-27 MED ORDER — GABAPENTIN 300 MG PO CAPS: ORAL_CAPSULE | ORAL | 2 refills | Status: DC

## 2022-01-27 MED ORDER — SERTRALINE HCL 50 MG PO TABS: 50.0000 mg | ORAL_TABLET | Freq: Every day | ORAL | 2 refills | Status: DC

## 2022-01-27 MED ORDER — PROPRANOLOL HCL 10 MG PO TABS: 10.0000 mg | ORAL_TABLET | Freq: Four times a day (QID) | ORAL | 2 refills | Status: DC

## 2022-01-27 MED ORDER — MIRTAZAPINE 30 MG PO TABS: ORAL_TABLET | ORAL | 2 refills | Status: DC

## 2022-01-27 NOTE — Progress Notes (Signed)
BH MD/PA/NP OP Progress Note  01/27/2022 1:19 PM Mark Strickland  MRN:  [id number] Virtual Visit via Telephone Note  I connected with Mark Strickland on 01/27/22 at  1:00 PM EST by telephone and verified that I am speaking with the correct person using two identifiers.  Location: Patient: home Provider: clinic   I discussed the limitations, risks, security and privacy concerns of performing an evaluation and management service by telephone and the availability of in person appointments. I also discussed with the patient that there may be a patient responsible charge related to this service. The patient expressed understanding and agreed to proceed.   I discussed the assessment and treatment plan with the patient. The patient was provided an opportunity to ask questions and all were answered. The patient agreed with the plan and demonstrated an understanding of the instructions.   The patient was advised to call back or seek an in-person evaluation if the symptoms worsen or if the condition fails to improve as anticipated.  I provided 30 minutes of non-face-to-face time during this encounter.   Mcneil Sober, NP   Chief Complaint: "I'm okay"  HPI:   Mark Strickland is a 31 year old male presenting to [facility] behavioral health outpatient for follow-up psychiatric evaluation.  Patient was seen via virtual telephone visit. He has a psychiatric history of major depressive disorder, generalized anxiety disorder, substance-induced mood disorder, suicide attempt, and opioid use disorder.  He is currently managed on Zoloft 50 mg daily, gabapentin 600 mg 3 times a daily, propanolol 10 mg four times daily, hydroxyzine 25 mg 3 times daily as needed for anxiety, Remeron 30 mg at bedtime. He reports medication compliance and efficacy. Today he is alert and oriented x 4, pleasant, cooperative, and willing to engage. He reports getting adequate sleep at night. He denies financial difficulties and stable,  safe home environment. His mood is "okay". Jamorian denies suicidal or homicidal ideations, paranoia, delusional thought, or auditory or visual hallucinations.  GAD-7, PHQ 2&9, Nutrition and Pain assessments completed during today's visit.  Visit Diagnosis:    ICD-10-CM   1. Recurrent major depressive disorder, in partial remission (HCC)  F33.41 mirtazapine (REMERON) 30 MG tablet    sertraline (ZOLOFT) 50 MG tablet    2. Generalized anxiety disorder  F41.1 gabapentin (NEURONTIN) 300 MG capsule    hydrOXYzine (ATARAX) 25 MG tablet    mirtazapine (REMERON) 30 MG tablet    propranolol (INDERAL) 10 MG tablet    sertraline (ZOLOFT) 50 MG tablet    3. 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   Past Medical History:  Past Medical History:  Diagnosis Date   Anxiety    Depressed    Depression    Heroin abuse (HCC)    Opiate addiction (HCC)    Seizures (HCC)    childhood   Substance abuse (HCC)    History abuse hx. Clean for 6 months.    Past Surgical History:  Procedure Laterality Date   NO PAST SURGERIES      Family Psychiatric History: None known  Family History:  Family History  Family history unknown: Yes    Social History:  Social History   Socioeconomic History   Marital status: Single    Spouse name: Not on file   Number of children: Not on file   Years of  education: Not on file   Highest education level: Not on file  Occupational History   Occupation: "treework"  Tobacco Use   Smoking status: Every Day    Packs/day: 0.50    Types: Cigarettes   Smokeless tobacco: Never  Vaping Use   Vaping Use: Never used  Substance and Sexual Activity   Alcohol use: Not Currently   Drug use: Yes    Types: Heroin, Benzodiazepines, IV, Hydrocodone, Oxycodone    Comment:  Heroin    Sexual activity: Yes  Other Topics Concern   Not on file  Social History Narrative   ** Merged History Encounter **       Social Determinants of Health   Financial Resource Strain: Not on file  Food Insecurity: Not on file  Transportation Needs: Not on file  Physical Activity: Not on file  Stress: Not on file  Social Connections: Not on file    Allergies: No Known Allergies  Metabolic Disorder Labs: No results found for: HGBA1C, MPG No results found for: PROLACTIN No results found for: CHOL, TRIG, HDL, CHOLHDL, VLDL, LDLCALC Lab Results  Component Value Date   TSH 0.320 (L) 08/18/2017   TSH 0.055 (L) 08/15/2017    Therapeutic Level Labs: No results found for: LITHIUM No results found for: VALPROATE No components found for:  CBMZ  Current Medications: Current Outpatient Medications  Medication Sig Dispense Refill   gabapentin (NEURONTIN) 300 MG capsule TAKE 2 CAPSULES BY MOUTH THREE TIMES A DAY 180 capsule 2   hydrOXYzine (ATARAX) 25 MG tablet Take 1 tablet (25 mg total) by mouth 3 (three) times daily as needed for anxiety. 90 tablet 2   ibuprofen (ADVIL,MOTRIN) 200 MG tablet Take 200 mg by mouth every 6 (six) hours as needed for moderate pain.     mirtazapine (REMERON) 30 MG tablet TAKE 1 TABLET BY MOUTH AT BEDTIME FOR MOOD/SLEEP 30 tablet 2   propranolol (INDERAL) 10 MG tablet Take 1 tablet (10 mg total) by mouth 4 (four) times daily. 120 tablet 2   sertraline (ZOLOFT) 50 MG tablet Take 1 tablet (50 mg total) by mouth daily. 30 tablet 2   No current facility-administered medications for this visit.     Musculoskeletal: Strength & Muscle Tone: within normal limits Gait & Station: normal Patient leans: N/A  Psychiatric Specialty Exam: Review of Systems  Psychiatric/Behavioral:  Negative for agitation, behavioral problems, confusion, decreased concentration, dysphoric mood, hallucinations, self-injury, sleep disturbance and suicidal ideas. The patient  is not nervous/anxious and is not hyperactive.   All other systems reviewed and are negative.  There were no vitals taken for this visit.There is no height or weight on file to calculate BMI.  General Appearance: NA  Eye Contact:  NA  Speech:  Clear and Coherent  Volume:  Normal  Mood:  Euthymic  Affect:  NA  Thought Process:  Coherent and Linear  Orientation:  Full (Time, Place, and Person)  Thought Content: Logical   Suicidal Thoughts:  No  Homicidal Thoughts:  No  Memory:  Immediate;   Good Recent;   Good Remote;   Good  Judgement:  Good  Insight:  Good  Psychomotor Activity:  NA  Concentration:  Concentration: Good and Attention Span: Good  Recall:  Good  Fund of Knowledge: Good  Language: Good  Akathisia:  NA  Handed:  Left  AIMS (if indicated): not done  Assets:  Communication Skills Desire for Improvement Financial Resources/Insurance Housing  ADL's:  Intact  Cognition: WNL  Sleep:  Good   Screenings: AIMS    Flowsheet Row [facility]  AIMS Total Score 0      AUDIT    Flowsheet Row [facility]  Alcohol Use Disorder Identification Test Final Score (AUDIT) 0      GAD-7    Flowsheet Row Video Visit from 01/27/2022 in [facility] Video Visit from 05/06/2021 in [facility] Video Visit from 02/11/2021 in [facility] Video Visit from 10/13/2020 in [facility]  Total GAD-7 Score 5 9 17 11       PHQ2-9    Flowsheet Row Video Visit from 01/27/2022 in [facility] Video Visit from 05/06/2021 in [facility] Video Visit from 02/11/2021 in [facility]  PHQ-2 Total Score 1 1 6   PHQ-9 Total Score 4 6 18       Flowsheet Row  Video Visit from 01/27/2022 in [facility] Video Visit from 05/06/2021 in [facility] Video Visit from 02/11/2021 in [facility]  C-SSRS RISK CATEGORY No Risk No Risk No Risk        Assessment and Plan: Romulus Crafts is a 31 year old male presenting to [facility] behavioral health outpatient for follow-up psychiatric evaluation. Patient has a history of Generalized anxiety disorder and major depressive disorder and symptoms are managed on Gabapentin 300 mg 2 capsules by mouth three times a day, Hydroxyzine 25 mg by mouth three times daily as needed for anxiety, mirtazapine 30 mg by mouth at bedtime, propranolol 10 mg four times daily, and Zoloft 50 mg daily. He remains compliant with this medication regimen. He denies medication adverse reactions or a need for a dose adjustment today. Medication risks vs benefits discussed. Medications refills e-scribed and sent to patient's preferred pharmacy.  Treatment/Medications:   1. Recurrent major depressive disorder, in partial remission (HCC) Zoloft 50 mg daily Mirtazapine 30 mg by mouth at bedtime 2. Generalized anxiety disorder Gabapentin 300mg  2 capsules by mouth three times a day  Hydroxyzine 25 mg by mouth three times daily as needed for anxiety Propranolol 10 mg four times daily  Return to care in 3 months or as needed if symptoms worsen.  Mcneil Sober, NP 01/27/2022, 1:19 PM

## 2022-01-30 ENCOUNTER — Encounter (HOSPITAL_COMMUNITY): Payer: Self-pay | Admitting: Emergency Medicine

## 2022-01-30 ENCOUNTER — Ambulatory Visit (HOSPITAL_COMMUNITY)
Admission: RE | Admit: 2022-01-30 | Discharge: 2022-01-30 | Disposition: A | Discharge status: Home / Self Care | Payer: No Payment, Other | Attending: Psychiatry | Admitting: Psychiatry

## 2022-01-30 ENCOUNTER — Emergency Department (HOSPITAL_COMMUNITY)
Admission: EM | Admit: 2022-01-30 | Discharge: 2022-01-30 | Disposition: A | Discharge status: Other Acute Inpatient Hospital | Payer: Self-pay | Attending: Emergency Medicine | Admitting: Emergency Medicine

## 2022-01-30 DIAGNOSIS — F419 Anxiety disorder, unspecified: Secondary | ICD-10-CM | POA: Insufficient documentation

## 2022-01-30 DIAGNOSIS — F1110 Opioid abuse, uncomplicated: Secondary | ICD-10-CM | POA: Insufficient documentation

## 2022-01-30 DIAGNOSIS — Z79899 Other long term (current) drug therapy: Secondary | ICD-10-CM | POA: Insufficient documentation

## 2022-01-30 DIAGNOSIS — F1910 Other psychoactive substance abuse, uncomplicated: Secondary | ICD-10-CM | POA: Insufficient documentation

## 2022-01-30 DIAGNOSIS — F339 Major depressive disorder, recurrent, unspecified: Secondary | ICD-10-CM | POA: Insufficient documentation

## 2022-01-30 DIAGNOSIS — F1310 Sedative, hypnotic or anxiolytic abuse, uncomplicated: Secondary | ICD-10-CM | POA: Insufficient documentation

## 2022-01-30 DIAGNOSIS — F4310 Post-traumatic stress disorder, unspecified: Secondary | ICD-10-CM | POA: Insufficient documentation

## 2022-01-30 DIAGNOSIS — Z20822 Contact with and (suspected) exposure to covid-19: Secondary | ICD-10-CM | POA: Insufficient documentation

## 2022-01-30 LAB — CBC WITH DIFFERENTIAL/PLATELET
Abs Immature Granulocytes: 0.1 10*3/uL — ABNORMAL HIGH (ref 0.00–0.07)
Basophils Absolute: 0 10*3/uL (ref 0.0–0.1)
Basophils Relative: 0 %
Eosinophils Absolute: 0.1 10*3/uL (ref 0.0–0.5)
Eosinophils Relative: 1 %
HCT: 41.8 % (ref 39.0–52.0)
Hemoglobin: 13.4 g/dL (ref 13.0–17.0)
Immature Granulocytes: 1 %
Lymphocytes Relative: 18 %
Lymphs Abs: 1.8 10*3/uL (ref 0.7–4.0)
MCH: 27.6 pg (ref 26.0–34.0)
MCHC: 32.1 g/dL (ref 30.0–36.0)
MCV: 86.2 fL (ref 80.0–100.0)
Monocytes Absolute: 1.5 10*3/uL — ABNORMAL HIGH (ref 0.1–1.0)
Monocytes Relative: 16 %
Neutro Abs: 6.3 10*3/uL (ref 1.7–7.7)
Neutrophils Relative %: 64 %
Platelets: 332 10*3/uL (ref 150–400)
RBC: 4.85 MIL/uL (ref 4.22–5.81)
RDW: 13.5 % (ref 11.5–15.5)
WBC: 9.8 10*3/uL (ref 4.0–10.5)
nRBC: 0 % (ref 0.0–0.2)

## 2022-01-30 LAB — COMPREHENSIVE METABOLIC PANEL
ALT: 114 U/L — ABNORMAL HIGH (ref 0–44)
AST: 81 U/L — ABNORMAL HIGH (ref 15–41)
Albumin: 3.9 g/dL (ref 3.5–5.0)
Alkaline Phosphatase: 99 U/L (ref 38–126)
Anion gap: 11 (ref 5–15)
BUN: 17 mg/dL (ref 6–20)
CO2: 24 mmol/L (ref 22–32)
Calcium: 9.2 mg/dL (ref 8.9–10.3)
Chloride: 102 mmol/L (ref 98–111)
Creatinine, Ser: 1.63 mg/dL — ABNORMAL HIGH (ref 0.61–1.24)
GFR, Estimated: 57 mL/min — ABNORMAL LOW (ref >60)
Glucose, Bld: 65 mg/dL — ABNORMAL LOW (ref 70–99)
Potassium: 3.6 mmol/L (ref 3.5–5.1)
Sodium: 137 mmol/L (ref 135–145)
Total Bilirubin: 0.2 mg/dL — ABNORMAL LOW (ref 0.3–1.2)
Total Protein: 8 g/dL (ref 6.5–8.1)

## 2022-01-30 LAB — SALICYLATE LEVEL: Salicylate Lvl: <7 mg/dL — ABNORMAL LOW (ref 7.0–30.0)

## 2022-01-30 LAB — RAPID URINE DRUG SCREEN, HOSP PERFORMED
Amphetamines: NOT DETECTED
Barbiturates: NOT DETECTED
Benzodiazepines: POSITIVE — AB
Cocaine: NOT DETECTED
Opiates: POSITIVE — AB
Tetrahydrocannabinol: NOT DETECTED

## 2022-01-30 LAB — ETHANOL: Alcohol, Ethyl (B): <10 mg/dL (ref <10)

## 2022-01-30 LAB — ACETAMINOPHEN LEVEL: Acetaminophen (Tylenol), Serum: <10 ug/mL — ABNORMAL LOW (ref 10–30)

## 2022-01-30 LAB — RESP PANEL BY RT-PCR (FLU A&B, COVID) ARPGX2
Influenza A by PCR: NEGATIVE
Influenza B by PCR: NEGATIVE
SARS Coronavirus 2 by RT PCR: NEGATIVE

## 2022-01-30 NOTE — Discharge Instructions (Signed)
You had labs performed today that showed your kidney function was abnormal.  Please drink plenty of fluids and follow up with your doctor in the next week to have your labs rechecked.  Avoid ibuprofen and aleve for the next week.

## 2022-01-30 NOTE — ED Notes (Signed)
EMTALA form completed and signed by patient. Patients 1 belonging bag given to Joy with Psychologist, educational.

## 2022-01-30 NOTE — ED Provider Notes (Signed)
Requested by nursing staff to complete EMTALA.  Patient awaiting transfer to behavioral health for restarting his psychiatric medications and detoxing from illicit drugs.  Labs significant for elevation in his creatinine when compared to baseline.  Discussed with patient findings of labs.  Recommend oral fluid hydration as well as avoiding NSAIDs.  Discussed with patient importance of following up with his PCP in the next week for having his labs rechecked.   Tilden Fossa, MD 01/31/22 [phone]

## 2022-01-30 NOTE — ED Notes (Addendum)
Pt. In burgundy scrubs and wanded by security.Pt. Has 1 belongings bag. Pt. Has 1 brown hat, 1 pr, shoes, 1 blue jean pant, 1 jacket, 1 shirt. Pt. Belongings locked up in cabinet behind nurses station on the RES A and B side. No cell phone or wallet.

## 2022-01-30 NOTE — ED Notes (Signed)
[employer] called, spoke to [employer].

## 2022-01-30 NOTE — H&P (Signed)
Behavioral Health Medical Screening Exam  Mark Strickland is an 31 y.o. male with past psychiatric history of intentional drug overdose, toxic encephalopathy, polysubstance overdose, opiate dependence, MDD recurrent severe, substance induced mood disorder, GAD who presents to [facility] voluntarily for assessment of substance abuse and detox from ETOH, fentanyl. Patient reports active use history x1 use, last use 2.5 hours prior to arrival. He denies any suicidal or homicidal ideations, auditory or visual hallucinations. Provider discussed [facility] services and the need for further medical clearance first given recent use; patient in agreement with plan to go to ED for medical clearance with plan for admission to [facility].   Total Time spent with patient: 20 minutes  Psychiatric Specialty Exam: Physical Exam Vitals and nursing note reviewed.  Constitutional:      Appearance: He is not diaphoretic.  HENT:     Head: Normocephalic.     Nose: Nose normal.     Mouth/Throat:     Pharynx: Oropharynx is clear.  Eyes:     Pupils: Pupils are equal, round, and reactive to light.  Cardiovascular:     Rate and Rhythm: Normal rate.     Pulses: Normal pulses.  Pulmonary:     Effort: Pulmonary effort is normal.  Abdominal:     General: Bowel sounds are normal.  Musculoskeletal:        General: Normal range of motion.     Cervical back: Normal range of motion.  Skin:    General: Skin is warm and dry.  Neurological:     Mental Status: He is alert and oriented to person, place, and time. Mental status is at baseline.  Psychiatric:        Attention and Perception: He does not perceive auditory or visual hallucinations.        Mood and Affect: Affect is flat.        Speech: Speech normal.        Behavior: Behavior is withdrawn.        Thought Content: Thought content is not paranoid or delusional. Thought content does not include homicidal or suicidal ideation. Thought content does not include homicidal  or suicidal plan.        Cognition and Memory: Cognition and memory normal.        Judgment: Judgment is impulsive.   Review of Systems  Psychiatric/Behavioral:  Negative for suicidal ideas.   All other systems reviewed and are negative. Blood pressure 98/60, pulse 77, temperature 98.4 F (36.9 C), temperature source Oral, resp. rate 18, SpO2 100 %.There is no height or weight on file to calculate BMI. General Appearance: Casual and Disheveled Eye Contact:  Fair Speech:  Clear and Coherent Volume:  Normal Mood:  Dysphoric Affect:  Constricted Thought Process:  Goal Directed Orientation:  Full (Time, Place, and Person) Thought Content:  Logical Suicidal Thoughts:  No Homicidal Thoughts:  No Memory:  Immediate;   Fair Recent;   Fair Remote;   Fair Judgement:  Fair Insight:  Fair Psychomotor Activity:  Normal Concentration: Concentration: Fair and Attention Span: Fair Recall:  [employer] of Knowledge:Fair Language: Fair Akathisia:  NA Handed:  Right AIMS (if indicated):    Assets:  Communication Skills Desire for Improvement Housing Physical Health Resilience Sleep:     Musculoskeletal: Strength & Muscle Tone: within normal limits Gait & Station: normal Patient leans: N/A  Blood pressure 98/60, pulse 77, temperature 98.4 F (36.9 C), temperature source Oral, resp. rate 18, SpO2 100 %.  Recommendations: Based  on my evaluation the patient appears to have an emergency medical condition for which I recommend the patient be transferred to the emergency department for further evaluation. Patient endorses using Fentanyl and alcohol within past 2.5 hours; pt being sent to [facility] for medical clearance, Almyra Free, MD called and notified. Recommended for [facility] placement upon medical clearance; Tanika NP notified.   Mark Merlin, NP 01/30/2022, 5:19 PM

## 2022-01-30 NOTE — ED Provider Notes (Signed)
[facility]-EMERGENCY DEPT Provider Note   CSN: [id number] Arrival date & time: 01/30/22  1754     History  No chief complaint on file.   Mark Strickland is a 31 y.o. male.  Patient sent here from behavioral health unit for medical clearance.  Apparently presented there stating that has been using drugs and would like to stop and restart his psychiatric medications.  Otherwise denies any headache or chest pain abdominal pain no fever no cough no vomiting or diarrhea.      Home Medications Prior to Admission medications   Medication Sig Start Date End Date Taking? Authorizing Provider  hydrOXYzine (ATARAX) 25 MG tablet Take 1 tablet (25 mg total) by mouth 3 (three) times daily as needed for anxiety. 01/27/22  Yes Penn, Cranston Neighbor, NP  ibuprofen (ADVIL,MOTRIN) 200 MG tablet Take 200 mg by mouth every 6 (six) hours as needed for moderate pain.   Yes [provider]  gabapentin (NEURONTIN) 300 MG capsule TAKE 2 CAPSULES BY MOUTH THREE TIMES A DAY Patient not taking: Reported on 01/30/2022 01/27/22   Mcneil Sober, NP  mirtazapine (REMERON) 30 MG tablet TAKE 1 TABLET BY MOUTH AT BEDTIME FOR MOOD/SLEEP Patient not taking: Reported on 01/30/2022 01/27/22   Mcneil Sober, NP  propranolol (INDERAL) 10 MG tablet Take 1 tablet (10 mg total) by mouth 4 (four) times daily. Patient not taking: Reported on 01/30/2022 01/27/22   Mcneil Sober, NP  sertraline (ZOLOFT) 50 MG tablet Take 1 tablet (50 mg total) by mouth daily. Patient not taking: Reported on 01/30/2022 01/27/22   Mcneil Sober, NP      Allergies    Patient has no known allergies.    Review of Systems   Review of Systems  Constitutional:  Negative for fever.  HENT:  Negative for ear pain and sore throat.   Eyes:  Negative for pain.  Respiratory:  Negative for cough.   Cardiovascular:  Negative for chest pain.  Gastrointestinal:  Negative for abdominal pain.  Genitourinary:  Negative for flank pain.  Musculoskeletal:   Negative for back pain.  Skin:  Negative for color change and rash.  Neurological:  Negative for syncope.  All other systems reviewed and are negative.  Physical Exam Updated Vital Signs BP (!) 98/58 (BP Location: Left Arm)    Pulse 84    Temp 98.4 F (36.9 C) (Oral)    Resp 18    SpO2 100%  Physical Exam Constitutional:      Appearance: He is well-developed.  HENT:     Head: Normocephalic.     Nose: Nose normal.  Eyes:     Extraocular Movements: Extraocular movements intact.  Cardiovascular:     Rate and Rhythm: Normal rate.  Pulmonary:     Effort: Pulmonary effort is normal.  Skin:    Coloration: Skin is not jaundiced.  Neurological:     Mental Status: He is alert. Mental status is at baseline.    ED Results / Procedures / Treatments   Labs (all labs ordered are listed, but only abnormal results are displayed) Labs Reviewed  CBC WITH DIFFERENTIAL/PLATELET - Abnormal; Notable for the following components:      Result Value   Monocytes Absolute 1.5 (*)    Abs Immature Granulocytes 0.10 (*)    All other components within normal limits  COMPREHENSIVE METABOLIC PANEL - Abnormal; Notable for the following components:   Glucose, Bld 65 (*)    Creatinine, Ser 1.63 (*)    AST 81 (*)  ALT 114 (*)    Total Bilirubin 0.2 (*)    GFR, Estimated 57 (*)    All other components within normal limits  ACETAMINOPHEN LEVEL - Abnormal; Notable for the following components:   Acetaminophen (Tylenol), Serum <10 (*)    All other components within normal limits  SALICYLATE LEVEL - Abnormal; Notable for the following components:   Salicylate Lvl <7.0 (*)    All other components within normal limits  RAPID URINE DRUG SCREEN, HOSP PERFORMED - Abnormal; Notable for the following components:   Opiates POSITIVE (*)    Benzodiazepines POSITIVE (*)    All other components within normal limits  ETHANOL    EKG None  Radiology No results found.  Procedures Procedures    Medications  Ordered in ED Medications - No data to display  ED Course/ Medical Decision Making/ A&P                           Medical Decision Making Amount and/or Complexity of Data Reviewed Labs: ordered.   Patient seen at behavioral health unit today.  Labs here showed mild renal insufficiency, potassium otherwise normal.  Positive urine drug screen.  Patient medically cleared for continued care at [facility].  Blood pressure appears slightly low, however repeat blood pressures appear improved patient has no focal deficit ambulatory standing without dizziness or lightheadedness.  He has no complaints or symptoms at this time and is medically cleared.        Final Clinical Impression(s) / ED Diagnoses Final diagnoses:  Polysubstance abuse [facility])    Rx / DC Orders ED Discharge Orders     None         Cheryll Cockayne, MD 01/30/22 2113

## 2022-01-30 NOTE — ED Triage Notes (Signed)
Patient sent from [facility] for medical clearance. States he has been using drugs and would like to get back on his psych medications.

## 2022-01-31 ENCOUNTER — Encounter (HOSPITAL_COMMUNITY): Payer: Self-pay | Admitting: Physician Assistant

## 2022-01-31 ENCOUNTER — Other Ambulatory Visit: Payer: Self-pay

## 2022-01-31 ENCOUNTER — Other Ambulatory Visit (HOSPITAL_COMMUNITY)
Admission: EM | Admit: 2022-01-31 | Discharge: 2022-02-01 | Disposition: A | Discharge status: Home / Self Care | Payer: No Payment, Other | Attending: Psychiatry | Admitting: Psychiatry

## 2022-01-31 DIAGNOSIS — F3341 Major depressive disorder, recurrent, in partial remission: Secondary | ICD-10-CM

## 2022-01-31 DIAGNOSIS — F339 Major depressive disorder, recurrent, unspecified: Secondary | ICD-10-CM

## 2022-01-31 DIAGNOSIS — F411 Generalized anxiety disorder: Secondary | ICD-10-CM

## 2022-01-31 DIAGNOSIS — F1910 Other psychoactive substance abuse, uncomplicated: Secondary | ICD-10-CM | POA: Diagnosis present

## 2022-01-31 LAB — TSH: TSH: 1.946 u[IU]/mL (ref 0.350–4.500)

## 2022-01-31 LAB — LIPID PANEL
Cholesterol: 175 mg/dL (ref 0–200)
HDL: 43 mg/dL (ref >40)
LDL Cholesterol: 108 mg/dL — ABNORMAL HIGH (ref 0–99)
Total CHOL/HDL Ratio: 4.1 RATIO
Triglycerides: 122 mg/dL (ref <150)
VLDL: 24 mg/dL (ref 0–40)

## 2022-01-31 LAB — HEMOGLOBIN A1C
Hgb A1c MFr Bld: 5 % (ref 4.8–5.6)
Mean Plasma Glucose: 96.8 mg/dL

## 2022-01-31 MED ORDER — SERTRALINE HCL 50 MG PO TABS
50.0000 mg | ORAL_TABLET | Freq: Every day | ORAL | Status: DC
  Administered 2022-01-31 – 2022-02-01 (×2): 50 mg via ORAL
  Filled 2022-01-31 (×2): qty 1
  Filled 2022-01-31: qty 7

## 2022-01-31 MED ORDER — LORAZEPAM 1 MG PO TABS: 1.0000 mg | ORAL_TABLET | Freq: Three times a day (TID) | ORAL | Status: DC

## 2022-01-31 MED ORDER — CLONIDINE HCL 0.1 MG PO TABS
0.1000 mg | ORAL_TABLET | ORAL | Status: DC | PRN
  Administered 2022-01-31 – 2022-02-01 (×3): 0.1 mg via ORAL
  Filled 2022-01-31 (×3): qty 1

## 2022-01-31 MED ORDER — DICYCLOMINE HCL 10 MG PO CAPS
10.0000 mg | ORAL_CAPSULE | Freq: Four times a day (QID) | ORAL | Status: DC | PRN
  Administered 2022-01-31 – 2022-02-01 (×2): 10 mg via ORAL
  Filled 2022-01-31 (×2): qty 1

## 2022-01-31 MED ORDER — LORAZEPAM 1 MG PO TABS: 1.0000 mg | ORAL_TABLET | Freq: Two times a day (BID) | ORAL | Status: DC

## 2022-01-31 MED ORDER — GABAPENTIN 300 MG PO CAPS
600.0000 mg | ORAL_CAPSULE | Freq: Three times a day (TID) | ORAL | Status: DC
  Administered 2022-01-31 – 2022-02-01 (×4): 600 mg via ORAL
  Filled 2022-01-31: qty 42
  Filled 2022-01-31 (×4): qty 2

## 2022-01-31 MED ORDER — ADULT MULTIVITAMIN W/MINERALS CH
1.0000 | ORAL_TABLET | Freq: Every day | ORAL | Status: DC
  Administered 2022-01-31 – 2022-02-01 (×2): 1 via ORAL
  Filled 2022-01-31 (×2): qty 1

## 2022-01-31 MED ORDER — LORAZEPAM 1 MG PO TABS
1.0000 mg | ORAL_TABLET | Freq: Once | ORAL | Status: AC
  Administered 2022-01-31: 1 mg via ORAL
  Filled 2022-01-31: qty 1

## 2022-01-31 MED ORDER — METHOCARBAMOL 750 MG PO TABS: 750.0000 mg | ORAL_TABLET | Freq: Three times a day (TID) | ORAL | Status: DC | PRN

## 2022-01-31 MED ORDER — LORAZEPAM 1 MG PO TABS: 1.0000 mg | ORAL_TABLET | Freq: Every day | ORAL | Status: DC

## 2022-01-31 MED ORDER — THIAMINE HCL 100 MG PO TABS
100.0000 mg | ORAL_TABLET | Freq: Every day | ORAL | Status: DC
  Administered 2022-02-01: 100 mg via ORAL
  Filled 2022-01-31: qty 1

## 2022-01-31 MED ORDER — ACETAMINOPHEN 325 MG PO TABS: 650.0000 mg | ORAL_TABLET | Freq: Four times a day (QID) | ORAL | Status: DC | PRN

## 2022-01-31 MED ORDER — PROPRANOLOL HCL 10 MG PO TABS
10.0000 mg | ORAL_TABLET | Freq: Four times a day (QID) | ORAL | Status: DC
  Administered 2022-01-31 – 2022-02-01 (×5): 10 mg via ORAL
  Filled 2022-01-31 (×5): qty 1

## 2022-01-31 MED ORDER — TRAZODONE HCL 50 MG PO TABS: 50.0000 mg | ORAL_TABLET | Freq: Every evening | ORAL | Status: DC | PRN

## 2022-01-31 MED ORDER — LORAZEPAM 1 MG PO TABS
1.0000 mg | ORAL_TABLET | Freq: Four times a day (QID) | ORAL | Status: DC | PRN
  Administered 2022-01-31 – 2022-02-01 (×3): 1 mg via ORAL
  Filled 2022-01-31 (×3): qty 1

## 2022-01-31 MED ORDER — ALUM & MAG HYDROXIDE-SIMETH 200-200-20 MG/5ML PO SUSP: 30.0000 mL | ORAL | Status: DC | PRN

## 2022-01-31 MED ORDER — LOPERAMIDE HCL 2 MG PO CAPS: 2.0000 mg | ORAL_CAPSULE | ORAL | Status: DC | PRN

## 2022-01-31 MED ORDER — LORAZEPAM 1 MG PO TABS
1.0000 mg | ORAL_TABLET | Freq: Four times a day (QID) | ORAL | Status: DC
  Administered 2022-01-31 – 2022-02-01 (×5): 1 mg via ORAL
  Filled 2022-01-31 (×5): qty 1

## 2022-01-31 MED ORDER — MAGNESIUM HYDROXIDE 400 MG/5ML PO SUSP: 30.0000 mL | Freq: Every day | ORAL | Status: DC | PRN

## 2022-01-31 MED ORDER — ONDANSETRON 4 MG PO TBDP
4.0000 mg | ORAL_TABLET | Freq: Four times a day (QID) | ORAL | Status: DC | PRN
  Administered 2022-01-31 – 2022-02-01 (×2): 4 mg via ORAL
  Filled 2022-01-31 (×2): qty 1
  Filled 2022-01-31: qty 10

## 2022-01-31 MED ORDER — THIAMINE HCL 100 MG/ML IJ SOLN
100.0000 mg | Freq: Once | INTRAMUSCULAR | Status: AC
  Administered 2022-01-31: 100 mg via INTRAMUSCULAR
  Filled 2022-01-31: qty 2

## 2022-01-31 MED ORDER — MIRTAZAPINE 30 MG PO TABS
30.0000 mg | ORAL_TABLET | Freq: Every day | ORAL | Status: DC
  Administered 2022-01-31 (×2): 30 mg via ORAL
  Filled 2022-01-31: qty 7
  Filled 2022-01-31 (×2): qty 1

## 2022-01-31 MED ORDER — HYDROXYZINE HCL 25 MG PO TABS: 50.0000 mg | ORAL_TABLET | Freq: Three times a day (TID) | ORAL | Status: DC | PRN

## 2022-01-31 MED ORDER — HYDROXYZINE HCL 25 MG PO TABS: 25.0000 mg | ORAL_TABLET | Freq: Four times a day (QID) | ORAL | Status: DC | PRN

## 2022-01-31 NOTE — Clinical Social Work Psych Note (Signed)
LCSW Initial Note  Mark Strickland reports that he came to the [facility] seeking assistance for detox services and medication management.   Mark Strickland presented with a depressed affect, congruent mood. Mark Strickland presented with a runny nose, headache and severe tremors. He reports feeling "very bad" this morning. He shared that he does not believe his medications are alleviating any symptoms at this time.   Mark Strickland endorsed using alcohol (ETOH), heroin and fentanyl. Mark Strickland reports drinking a 12 pack of beer, and 2 grams of heroin and fentanyl on a daily basis.    Mark Strickland denied any residential treatment or outpatient substance abuse referrals.  Mark Strickland does report he is interested in outpatient medication management services.    LCSW will continue to follow for any additional questions, concerns or identified needs.      [name], MSW, LCSW Clinical Child psychotherapist (Facility Based Crisis) [facility]

## 2022-01-31 NOTE — ED Notes (Signed)
When giving patient his gabapentin, he said that he is a little nauseous, he did take his medication however. Will continue to monitor for safety

## 2022-01-31 NOTE — ED Notes (Signed)
Pt bloodwork has been drawn, skin check has been completed, pt has been escorted over to the [facility] unit.

## 2022-01-31 NOTE — ED Notes (Signed)
Pt. Just threw up after getting a shot. NP and nurse are here discussing it.Marland KitchenMarland Kitchen

## 2022-01-31 NOTE — ED Notes (Signed)
Pt was having severe withdrawal symptoms. Pt was shaking and also vomited during morning rounds. Provider came to see pt after nurse notified provider.

## 2022-01-31 NOTE — BH Assessment (Signed)
Comprehensive Clinical Assessment (CCA) Note  01/31/2022 Ladislaus Fine [id number]  Chief Complaint:  Chief Complaint  Patient presents with   Addiction Problem    Visit Diagnosis:  Substance abuse ([place])  Episode of recurrent major depressive disorder, unspecified depression episode severity ([place])     CCA Screening, Triage and Referral (STR)  PER HPI : 31 year old male with a past psychiatric history significant for anxiety, PTSD, substance abuse, and depression who presents to [facility] via safe transport from [facility] for admission to [facility].  Patient states that he presents to [facility] to get back on his medications and to stop consuming alcohol and doing drugs.  Patient states that he is currently taking the following psychiatric medications: Zoloft 50 mg daily, propranolol 10 mg up to 4 times a day, gabapentin 600 mg 3 times daily, mirtazapine 30 mg at bedtime, and hydroxyzine 50 mg 3 times daily as needed.  Prior to presenting today, patient states that he was drinking every day and would drink roughly a 12 pack/day.  Patient states that he was using the following illicit substances: Heroin and fentanyl.  Patient states the last time he used was around 3:00 PM.  Patient endorses feeling withdrawal symptoms characterized by tremors, runny nose, and agitation.  Patient endorses the following depressive symptoms: feelings of sadness, hypersomnia, lack of motivation, decreased energy, decreased concentration, feelings of guilt/worthlessness, and hopelessness.  He states that his mood was so bad, that he did not want to leave the house anymore.  Patient also endorses anxiety he rates at an 8 out of 10.  Patient endorses panic attacks with his most recent panic attack occurring in the morning.  Patient states that he has been prescribed medications for the management of his panic attacks but stopped taking the medication due to the  medication being a controlled substance.  Patient is being followed by psychiatry with Dr. Ronne Binning of [place].  Patient denies having a therapist.  Patient states that he has been hospitalized due to mental health wants when he had COVID and was unable to speak.  Patient denies past history of suicide attempt and states that he has only had thoughts.  Patient denies a past history of self-injurious behavior.   Patient Reported Information How did you hear about [place]? No data recorded What Is the Reason for Your Visit/Call Today? 31 year old male with a past psychiatric history significant for anxiety, PTSD, substance abuse, and depression who presents to [facility] via safe transport from [facility] for admission to [facility].    Patient states that he presents to [facility] to get back on his medications and to stop consuming alcohol and doing drugs.  How Long Has This Been Causing You Problems? 1-6 months  What Do You Feel Would Help You the Most Today? Alcohol or Drug Use Treatment; Medication(s)   Have You Recently Had Any Thoughts About Hurting Yourself? No  Are You Planning to Commit Suicide/Harm Yourself At This time? No   Have you Recently Had Thoughts About [place]? No  Are You Planning to Harm Someone at This Time? No  Explanation: No data recorded  Have You Used Any Alcohol or Drugs in the Past 24 Hours? Yes  How Long Ago Did You Use Drugs or Alcohol? No data recorded What Did You Use and How Much? ETOH; Heroin and fentanyl   Do You Currently Have a Therapist/Psychiatrist? No  Name of  Therapist/Psychiatrist: No data recorded  Have You Been Recently Discharged From Any Office Practice or Programs? No  Explanation of Discharge From Practice/Program: No data recorded    CCA Screening Triage Referral Assessment Type of Contact: Face-to-Face  Telemedicine Service Delivery:   Is this Initial or Reassessment? No  data recorded Date Telepsych consult ordered in [facility]:  No data recorded Time Telepsych consult ordered in [facility]:  No data recorded Location of Assessment: [facility]  Provider Location: [facility]   Collateral Involvement: No   Does Patient Have a [place]? No data recorded Name and Contact of Legal Guardian: No data recorded If Minor and Not Living with Parent(s), Who has Custody? No data recorded Is CPS involved or ever been involved? Never  Is APS involved or ever been involved? Never   Patient Determined To Be At Risk for Harm To Self or Others Based on Review of Patient Reported Information or Presenting Complaint? No  Method: No data recorded Availability of Means: No data recorded Intent: No data recorded Notification Required: No data recorded Additional Information for Danger to Others Potential: No data recorded Additional Comments for Danger to Others Potential: No data recorded Are There Guns or Other Weapons in Your Home? No data recorded Types of Guns/Weapons: No data recorded Are These Weapons Safely Secured?                            No data recorded Who Could Verify You Are Able To Have These Secured: No data recorded Do You Have any Outstanding Charges, Pending Court Dates, Parole/Probation? No data recorded Contacted To Inform of Risk of Harm To Self or Others: No data recorded   Does Patient Present under Involuntary Commitment? No  IVC Papers Initial File Date: No data recorded  [place] of Residence: [place]   Patient Currently Receiving the Following Services: Not Receiving Services   Determination of Need: Urgent (48 hours)   Options For Referral: Facility-Based Crisis; Intensive Outpatient Therapy; Medication Management; Outpatient Therapy     CCA Biopsychosocial Patient Reported Schizophrenia/Schizoaffective Diagnosis in Past: No   Strengths: None   Mental Health  Symptoms Depression:   Difficulty Concentrating; Hopelessness; Irritability; Worthlessness   Duration of Depressive symptoms:  Duration of Depressive Symptoms: Greater than two weeks   Mania:   None   Anxiety:    Irritability   Psychosis:   None   Duration of Psychotic symptoms:    Trauma:   None   Obsessions:   None   Compulsions:   None   Inattention:   None   Hyperactivity/Impulsivity:   None   Oppositional/Defiant Behaviors:   None   Emotional Irregularity:   None   Other Mood/Personality Symptoms:  No data recorded   Mental Status Exam Appearance and self-care  Stature:   Average   Weight:   Average weight   Clothing:   Casual   Grooming:   Normal   Cosmetic use:   None   Posture/gait:   Normal   Motor activity:   Tremor   Sensorium  Attention:   Normal   Concentration:   Anxiety interferes   Orientation:   Object; Person; Place; Situation; Time; X5   Recall/memory:   Normal   Affect and Mood  Affect:   Depressed   Mood:   Depressed; Dysphoric; Irritable   Relating  Eye contact:   Avoided   Facial expression:  Depressed   Attitude toward examiner:   Cooperative; Irritable   Thought and Language  Speech flow:  Clear and Coherent   Thought content:   Appropriate to Mood and Circumstances   Preoccupation:   None   Hallucinations:   None   Organization:  No data recorded  [employer] of Knowledge:   Fair   Intelligence:   Average   Abstraction:   Normal   Judgement:   Fair   Art therapist:   Adequate   Insight:   Fair   Decision Making:   Normal   Social Functioning  Social Maturity:   Isolates   Social Judgement:   Normal   Stress  Stressors:   Other (Comment) (Substance use)   Coping Ability:   Overwhelmed   Skill Deficits:   None   Supports:   Family     Religion: Religion/Spirituality Are You A Religious Person?:  No  Leisure/Recreation: Leisure / Recreation Do You Have Hobbies?: No  Exercise/Diet: Exercise/Diet Do You Exercise?: No Have You Gained or Lost A Significant Amount of Weight in the Past Six Months?: No Do You Follow a Special Diet?: No Do You Have Any Trouble Sleeping?: No   CCA Employment/Education Employment/Work Situation: Employment / Work Situation Employment Situation: Unemployed Patient's Job has Been Impacted by Current Illness: No Has Patient ever Been in Passenger transport manager?: No  Education: Education Is Patient Currently Attending School?: No Last Grade Completed: 12 Did You Nutritional therapist?: Yes Did You Have An Individualized Education Program (IIEP): No Did You Have Any Difficulty At [employer]?: No Patient's Education Has Been Impacted by Current Illness: No   CCA Family/Childhood History Family and Relationship History: Family history Marital status: Single  Childhood History:  Childhood History By whom was/is the patient raised?: Both parents Did patient suffer any verbal/emotional/physical/sexual abuse as a child?: No Did patient suffer from severe childhood neglect?: No Has patient ever been sexually abused/assaulted/raped as an adolescent or adult?: No Was the patient ever a victim of a crime or a disaster?: No Witnessed domestic violence?: No Has patient been affected by domestic violence as an adult?: No  Child/Adolescent Assessment:     CCA Substance Use Alcohol/Drug Use: Alcohol / Drug Use Pain Medications: None Prescriptions: None Over the Counter: None History of alcohol / drug use?: Yes Longest period of sobriety (when/how long): Unknown Withdrawal Symptoms: Agitation, Fever / Chills, Irritability, Nausea / Vomiting, Sweats, Tremors Substance #1 Name of Substance 1: ETOH 1 - Age of First Use: Teenager 1 - Amount (size/oz): 12pk of beers 1 - Frequency: Daily 1 - Duration: "For many years" 1 - Last Use / Amount: 01/30/22 1 - Method of  Aquiring: Purchasing 1- Route of Use: Oral Substance #2 Name of Substance 2: Heroin 2 - Age of First Use: 31yo 2 - Amount (size/oz): 2 grams 2 - Frequency: 2 grams 2 - Duration: 1 year 2 - Last Use / Amount: 01/30/22 -  2 grams 2 - Method of Aquiring: Purchase off streets 2 - Route of Substance Use: Smoking Substance #3 Name of Substance 3: Fentanyl 3 - Age of First Use: 31 yo 3 - Amount (size/oz): 2 grams 3 - Frequency: Daily 3 - Duration: 1 year 3 - Last Use / Amount: 01/30/22 -  2 grams 3 - Method of Aquiring: Purchase off streets 3 - Route of Substance Use: Smoking                   ASAM's:  Six Dimensions of Multidimensional Assessment  Dimension 1:  Acute Intoxication and/or Withdrawal Potential:      Dimension 2:  Biomedical Conditions and Complications:      Dimension 3:  Emotional, Behavioral, or Cognitive Conditions and Complications:     Dimension 4:  Readiness to Change:     Dimension 5:  Relapse, Continued use, or Continued Problem Potential:     Dimension 6:  Recovery/Living Environment:     ASAM Severity Score:    ASAM Recommended Level of Treatment: ASAM Recommended Level of Treatment: Level III Residential Treatment   Substance use Disorder (SUD) Substance Use Disorder (SUD)  Checklist Symptoms of Substance Use: Continued use despite having a persistent/recurrent physical/psychological problem caused/exacerbated by use, Continued use despite persistent or recurrent social, interpersonal problems, caused or exacerbated by use  Recommendations for Services/Supports/Treatments: Recommendations for Services/Supports/Treatments Recommendations For Services/Supports/Treatments: Detox, Brewing technologist, CD-IOP Intensive Chemical Dependency Program, Residential-Level 1, SAIOP (Substance Abuse Intensive Outpatient Program)  Discharge Disposition: Discharge Disposition Medical Exam completed: No Disposition of Patient: Admit Mode of transportation if  patient is discharged/movement?: Car  DSM5 Diagnoses: Patient Active Problem List   Diagnosis Date Noted   Substance abuse ([place]) 01/31/2022   Generalized anxiety disorder 10/13/2020   Substance induced mood disorder ([place]) 10/13/2020   Mood disorder in full remission ([place]) 10/13/2020   Opiate dependence ([place]) 03/22/2019   MDD (major depressive disorder), recurrent episode, severe ([place]) 03/22/2019   Suicide attempt ([place])    Acute respiratory failure with hypoxia ([place]) 03/18/2019   Drug overdose 03/18/2019   Encephalopathy 08/18/2017   Altered mental status    Polysubstance overdose 08/15/2017   Rhabdomyolysis 08/15/2017   Elevated LFTs 08/14/2017   Fever 08/14/2017   Drug overdose, intentional ([place]) 12/03/2015   Encephalopathy, toxic 12/03/2015     Referrals to Alternative Service(s): Referred to Alternative Service(s):   Place:   Date:   Time:    Referred to Alternative Service(s):   Place:   Date:   Time:    Referred to Alternative Service(s):   Place:   Date:   Time:    Referred to Alternative Service(s):   Place:   Date:   Time:     Marylee Floras, LCSW

## 2022-01-31 NOTE — ED Notes (Signed)
Pt did not attend group. 

## 2022-01-31 NOTE — ED Notes (Signed)
Pt. Stated his clothes were left at [facility] called over and they found his red suitcase trying to figure out how to get it over here now.

## 2022-01-31 NOTE — ED Provider Notes (Signed)
Behavioral Health Admission [facility]&P [facility] & OBS)  Date: 01/31/22 Patient Name: Mark Strickland MRN: [id number] Chief Complaint: No chief complaint on file.     Diagnoses:  Final diagnoses:  Substance abuse (HCC)  Episode of recurrent major depressive disorder, unspecified depression episode severity (HCC)    HPI:   Mark Strickland is a 31 year old male with a past psychiatric history significant for anxiety, PTSD, substance abuse, and depression who presents to [facility] via safe transport from [facility] ED for admission to [facility] [employer] [facility].  Patient states that he presents to [facility] to get back on his medications and to stop consuming alcohol and doing drugs.  Patient states that he is currently taking the following psychiatric medications: Zoloft 50 mg daily, propranolol 10 mg up to 4 times a day, gabapentin 600 mg 3 times daily, mirtazapine 30 mg at bedtime, and hydroxyzine 50 mg 3 times daily as needed.  Prior to presenting today, patient states that he was drinking every day and would drink roughly a 12 pack/day.  Patient states that he was using the following illicit substances: Heroin and fentanyl.  Patient states the last time he used was around 3:00 PM.  Patient endorses feeling withdrawal symptoms characterized by tremors, runny nose, and agitation.  Patient endorses the following depressive symptoms: feelings of sadness, hypersomnia, lack of motivation, decreased energy, decreased concentration, feelings of guilt/worthlessness, and hopelessness.  He states that his mood was so bad, that he did not want to leave the house anymore.  Patient also endorses anxiety he rates at an 8 out of 10.  Patient endorses panic attacks with his most recent panic attack occurring in the morning.  Patient states that he has been prescribed medications for the management of his panic attacks but stopped taking the medication due to the medication being a controlled  substance.  Patient is being followed by psychiatry with Dr. Doyne Keel of [facility].  Patient denies having a therapist.  Patient states that he has been hospitalized due to mental health wants when he had COVID and was unable to speak.  Patient denies past history of suicide attempt and states that he has only had thoughts.  Patient denies a past history of self-injurious behavior.  Patient is alert and oriented x4, calm, cooperative, and fully engaged in conversation during the encounter.  Patient denies suicidal or homicidal ideations.  He further denies auditory or visual hallucinations and does not appear to be responding to internal/external stimuli.  Patient endorses varied sleep and receives between 4 and 12 hours of sleep each night.  Patient endorses decreased appetite and eats on average 1-2 meals per day.  Patient endorses alcohol consumption stating that he drinks roughly a 12 pack/day.  Patient denies tobacco use but states that he does engage in vaping.  Patient has used the following illicit substances: Xanax, heroin, and fentanyl.  PHQ 2-9:  Flowsheet Row Video Visit from 01/27/2022 in [facility] Video Visit from 05/06/2021 in [facility] Video Visit from 02/11/2021 in [facility]  Thoughts that you would be better off dead, or of hurting yourself in some way Not at all Not at all Not at all  PHQ-9 Total Score 4 6 18        Flowsheet Row ED from 01/31/2022 in [facility] ED from 01/30/2022 in [facility] Video Visit from 01/27/2022 in [facility]  C-SSRS  RISK CATEGORY No Risk No Risk No Risk        Total Time spent with patient: 15 minutes  Musculoskeletal  Strength & Muscle Tone: within normal limits Gait & Station: normal Patient leans: N/A  Psychiatric Specialty Exam  Presentation General Appearance:  Appropriate for Environment  Eye Contact:Good  Speech:Clear and Coherent; Normal Rate  Speech Volume:Normal  Handedness:Right   Mood and Affect  Mood:Anxious; Depressed  Affect:Congruent   Thought Process  Thought Processes:Coherent; Goal Directed  Descriptions of Associations:Intact  Orientation:Full (Time, Place and Person)  Thought Content:WDL; Logical    Hallucinations:Hallucinations: None  Ideas of Reference:None  Suicidal Thoughts:Suicidal Thoughts: No  Homicidal Thoughts:Homicidal Thoughts: No   Sensorium  Memory:Immediate Good; Recent Good; Remote Good  Judgment:Good  Insight:Good   Executive Functions  Concentration:Good  Attention Span:Good  Recall:Good  Fund of Knowledge:Good  Language:Good   Psychomotor Activity  Psychomotor Activity:Psychomotor Activity: Normal   Assets  Assets:Communication Skills; Desire for Improvement   Sleep  Sleep:Sleep: Fair   Nutritional Assessment (For OBS and [facility]BC admissions only) Has the patient had a weight loss or gain of 10 pounds or more in the last 3 months?: No Has the patient had a decrease in food intake/or appetite?: Yes Does the patient have dental problems?: No Does the patient have eating habits or behaviors that may be indicators of an eating disorder including binging or inducing vomiting?: No Has the patient recently lost weight without trying?: 0 Has the patient been eating poorly because of a decreased appetite?: 0 Malnutrition Screening Tool Score: 0    Physical Exam Psychiatric:        Attention and Perception: Attention and perception normal. He does not perceive auditory or visual hallucinations.        Mood and Affect: Affect normal. Mood is anxious and depressed.        Speech: Speech normal.        Behavior: Behavior normal. Behavior is cooperative.        Thought Content: Thought content is not paranoid. Thought content does not include homicidal or suicidal ideation.  Thought content does not include suicidal plan.        Cognition and Memory: Cognition and memory normal.        Judgment: Judgment is impulsive.   Review of Systems  Psychiatric/Behavioral:  Positive for depression and substance abuse. Negative for hallucinations, memory loss and suicidal ideas. The patient is nervous/anxious. The patient does not have insomnia.    Blood pressure (!) 109/49, pulse 72, temperature 98.3 F (36.8 C), temperature source Oral, resp. rate 16, SpO2 100 %. There is no height or weight on file to calculate BMI.  Past Psychiatric History:  Anxiety Depression PTSD Substance abuse   Is the patient at risk to self? No  Has the patient been a risk to self in the past 6 months? No .    Has the patient been a risk to self within the distant past? No   Is the patient a risk to others? No   Has the patient been a risk to others in the past 6 months? No   Has the patient been a risk to others within the distant past? No   Past Medical History:  Past Medical History:  Diagnosis Date   Anxiety    Depressed    Depression    Heroin abuse (HCC)    Opiate addiction (HCC)    Seizures (HCC)    childhood   Substance abuse (HCC)  History abuse hx. Clean for 6 months.    Past Surgical History:  Procedure Laterality Date   NO PAST SURGERIES      Family History:  Family History  Family history unknown: Yes    Social History:  Social History   Socioeconomic History   Marital status: Single    Spouse name: Not on file   Number of children: Not on file   Years of education: Not on file   Highest education level: Not on file  Occupational History   Occupation: "treework"  Tobacco Use   Smoking status: Every Day    Packs/day: 0.50    Types: Cigarettes   Smokeless tobacco: Never  Vaping Use   Vaping Use: Never used  Substance and Sexual Activity   Alcohol use: Not Currently   Drug use: Yes    Types: Heroin, Benzodiazepines, IV, Hydrocodone, Oxycodone     Comment: Heroin    Sexual activity: Yes  Other Topics Concern   Not on file  Social History Narrative   ** Merged History Encounter **       Social Determinants of Health   Financial Resource Strain: Not on file  Food Insecurity: Not on file  Transportation Needs: Not on file  Physical Activity: Not on file  Stress: Not on file  Social Connections: Not on file  Intimate Partner Violence: Not on file    SDOH:  SDOH Screenings   Alcohol Screen: Not on file  Depression (PHQ2-9): Low Risk    PHQ-2 Score: 4  Financial Resource Strain: Not on file  Food Insecurity: Not on file  Housing: Not on file  Physical Activity: Not on file  Social Connections: Not on file  Stress: Not on file  Tobacco Use: High Risk   Smoking Tobacco Use: Every Day   Smokeless Tobacco Use: Never   Passive Exposure: Not on file  Transportation Needs: Not on file    Last Labs:  Admission on 01/31/2022  Component Date Value Ref Range Status   Hgb A1c MFr Bld 01/31/2022 5.0  4.8 - 5.6 % Final   Comment: (NOTE) Pre diabetes:          5.7%-6.4%  Diabetes:              >6.4%  Glycemic control for   <7.0% adults with diabetes    Mean Plasma Glucose 01/31/2022 96.8  mg/dL Final   Performed at [facility], [address] [place], [place] [address]   Cholesterol 01/31/2022 175  0 - 200 mg/dL Final   Triglycerides 60/45/4098 122  <150 mg/dL Final   HDL 11/91/4782 43  >40 mg/dL Final   Total CHOL/HDL Ratio 01/31/2022 4.1  RATIO Final   VLDL 01/31/2022 24  0 - 40 mg/dL Final   LDL Cholesterol 01/31/2022 108 (H)  0 - 99 mg/dL Final   Comment:        Total Cholesterol/HDL:CHD Risk Coronary Heart Disease Risk Table                     Men   Women  1/2 Average Risk   3.4   3.3  Average Risk       5.0   4.4  2 X Average Risk   9.6   7.1  3 X Average Risk  23.4   11.0        Use the calculated Patient Ratio above and the CHD Risk Table to determine the patient's CHD Risk.  ATP III  CLASSIFICATION (LDL):  <100     mg/dL   Optimal  093-818  mg/dL   Near or Above                    Optimal  130-159  mg/dL   Borderline  299-371  mg/dL   High  >696     mg/dL   Very High Performed at [facility], [address], [place], [place] [address]    TSH 01/31/2022 1.946  0.350 - 4.500 uIU/mL Final   Comment: Performed by a 3rd Generation assay with a functional sensitivity of <=0.01 uIU/mL. Performed at [facility], [address], [place], [place] [address]   Admission on 01/30/2022, Discharged on 01/30/2022  Component Date Value Ref Range Status   WBC 01/30/2022 9.8  4.0 - 10.5 K/uL Final   RBC 01/30/2022 4.85  4.22 - 5.81 MIL/uL Final   Hemoglobin 01/30/2022 13.4  13.0 - 17.0 g/dL Final   HCT 10/12/8526 41.8  39.0 - 52.0 % Final   MCV 01/30/2022 86.2  80.0 - 100.0 fL Final   MCH 01/30/2022 27.6  26.0 - 34.0 pg Final   MCHC 01/30/2022 32.1  30.0 - 36.0 g/dL Final   RDW 78/24/2353 13.5  11.5 - 15.5 % Final   Platelets 01/30/2022 332  150 - 400 K/uL Final   nRBC 01/30/2022 0.0  0.0 - 0.2 % Final   Neutrophils Relative % 01/30/2022 64  % Final   Neutro Abs 01/30/2022 6.3  1.7 - 7.7 K/uL Final   Lymphocytes Relative 01/30/2022 18  % Final   Lymphs Abs 01/30/2022 1.8  0.7 - 4.0 K/uL Final   Monocytes Relative 01/30/2022 16  % Final   Monocytes Absolute 01/30/2022 1.5 (H)  0.1 - 1.0 K/uL Final   Eosinophils Relative 01/30/2022 1  % Final   Eosinophils Absolute 01/30/2022 0.1  0.0 - 0.5 K/uL Final   Basophils Relative 01/30/2022 0  % Final   Basophils Absolute 01/30/2022 0.0  0.0 - 0.1 K/uL Final   Immature Granulocytes 01/30/2022 1  % Final   Abs Immature Granulocytes 01/30/2022 0.10 (H)  0.00 - 0.07 K/uL Final   Performed at [facility], [address], [place], [place] [address]   Sodium 01/30/2022 137  135 - 145 mmol/L Final   Potassium 01/30/2022 3.6  3.5 - 5.1 mmol/L Final   Chloride 01/30/2022 102  98 - 111 mmol/L Final   CO2  01/30/2022 24  22 - 32 mmol/L Final   Glucose, Bld 01/30/2022 65 (L)  70 - 99 mg/dL Final   Glucose reference range applies only to samples taken after fasting for at least 8 hours.   BUN 01/30/2022 17  6 - 20 mg/dL Final   Creatinine, Ser 01/30/2022 1.63 (H)  0.61 - 1.24 mg/dL Final   Calcium 15/40/0867 9.2  8.9 - 10.3 mg/dL Final   Total Protein 61/95/0932 8.0  6.5 - 8.1 g/dL Final   Albumin 67/11/4579 3.9  3.5 - 5.0 g/dL Final   AST 99/83/3825 81 (H)  15 - 41 U/L Final   ALT 01/30/2022 114 (H)  0 - 44 U/L Final   Alkaline Phosphatase 01/30/2022 99  38 - 126 U/L Final   Total Bilirubin 01/30/2022 0.2 (L)  0.3 - 1.2 mg/dL Final   GFR, Estimated 01/30/2022 57 (L)  >60 mL/min Final   Comment: (NOTE) Calculated using the CKD-EPI Creatinine Equation (2021)  Anion gap 01/30/2022 11  5 - 15 Final   Performed at [facility], [address] [place], [place] [address]   Alcohol, Ethyl (B) 01/30/2022 <10  <10 mg/dL Final   Comment: (NOTE) Lowest detectable limit for serum alcohol is 10 mg/dL.  For medical purposes only. Performed at [facility], [address] [place], [place] [address]    Acetaminophen (Tylenol), Serum 01/30/2022 <10 (L)  10 - 30 ug/mL Final   Comment: (NOTE) Therapeutic concentrations vary significantly. A range of 10-30 ug/mL  may be an effective concentration for many patients. However, some  are best treated at concentrations outside of this range. Acetaminophen concentrations >150 ug/mL at 4 hours after ingestion  and >50 ug/mL at 12 hours after ingestion are often associated with  toxic reactions.  Performed at [facility], [address] [place], [place] [address]    Salicylate Lvl 01/30/2022 <7.0 (L)  7.0 - 30.0 mg/dL Final   Performed at [facility], [address] [place], [place] [address]   Opiates 01/30/2022 POSITIVE (A)  NONE DETECTED Final   Cocaine 01/30/2022 NONE  DETECTED  NONE DETECTED Final   Benzodiazepines 01/30/2022 POSITIVE (A)  NONE DETECTED Final   Amphetamines 01/30/2022 NONE DETECTED  NONE DETECTED Final   Tetrahydrocannabinol 01/30/2022 NONE DETECTED  NONE DETECTED Final   Barbiturates 01/30/2022 NONE DETECTED  NONE DETECTED Final   Comment: (NOTE) DRUG SCREEN FOR MEDICAL PURPOSES ONLY.  IF CONFIRMATION IS NEEDED FOR ANY PURPOSE, NOTIFY LAB WITHIN 5 DAYS.  LOWEST DETECTABLE LIMITS FOR URINE DRUG SCREEN Drug Class                     Cutoff (ng/mL) Amphetamine and metabolites    1000 Barbiturate and metabolites    200 Benzodiazepine                 200 Tricyclics and metabolites     300 Opiates and metabolites        300 Cocaine and metabolites        300 THC                            50 Performed at [facility], [address] [place], [place] [address]    SARS Coronavirus 2 by RT PCR 01/30/2022 NEGATIVE  NEGATIVE Final   Comment: (NOTE) SARS-CoV-2 target nucleic acids are NOT DETECTED.  The SARS-CoV-2 RNA is generally detectable in upper respiratory specimens during the acute phase of infection. The lowest concentration of SARS-CoV-2 viral copies this assay can detect is 138 copies/mL. A negative result does not preclude SARS-Cov-2 infection and should not be used as the sole basis for treatment or other patient management decisions. A negative result may occur with  improper specimen collection/handling, submission of specimen other than nasopharyngeal swab, presence of viral mutation(s) within the areas targeted by this assay, and inadequate number of viral copies(<138 copies/mL). A negative result must be combined with clinical observations, patient history, and epidemiological information. The expected result is Negative.  Fact Sheet for Patients:  BloggerCourse.comhttps://www.fda.gov/media/152166/download  Fact Sheet for Healthcare Providers:  SeriousBroker.ithttps://www.fda.gov/media/152162/download  This test is no                           t yet approved or cleared by the [place] FDA and  has been authorized for detection  and/or diagnosis of SARS-CoV-2 by FDA under an Emergency Use Authorization (EUA). This EUA will remain  in effect (meaning this test can be used) for the duration of the COVID-19 declaration under Section 564(b)(1) of the Act, 21 U.S.C.section 360bbb-3(b)(1), unless the authorization is terminated  or revoked sooner.       Influenza A by PCR 01/30/2022 NEGATIVE  NEGATIVE Final   Influenza B by PCR 01/30/2022 NEGATIVE  NEGATIVE Final   Comment: (NOTE) The Xpert Xpress SARS-CoV-2/FLU/RSV plus assay is intended as an aid in the diagnosis of influenza from Nasopharyngeal swab specimens and should not be used as a sole basis for treatment. Nasal washings and aspirates are unacceptable for Xpert Xpress SARS-CoV-2/FLU/RSV testing.  Fact Sheet for Patients: BloggerCourse.com  Fact Sheet for Healthcare Providers: SeriousBroker.it  This test is not yet approved or cleared by the [place] FDA and has been authorized for detection and/or diagnosis of SARS-CoV-2 by FDA under an Emergency Use Authorization (EUA). This EUA will remain in effect (meaning this test can be used) for the duration of the COVID-19 declaration under Section 564(b)(1) of the Act, 21 U.S.C. section 360bbb-3(b)(1), unless the authorization is terminated or revoked.  Performed at [facility], [address] [place], [place] [address]     Allergies: Patient has no known allergies.  PTA Medications: (Not in a hospital admission)   Medical Decision Making  Patient to be [facility] to [facility] due to substance abuse and possibly withdrawal symptoms. Admission labs to be ordered and initiated prior to patient being admitted to floor. Patient's home medications have been reviewed and ordered.  Recommendations  Based on  my evaluation the patient does not appear to have an emergency medical condition.  Meta Hatchet, PA 01/31/22  4:56 AM

## 2022-01-31 NOTE — ED Notes (Signed)
Patient has been in bed since the beginning of the shift. He has been sweating and trembling. His CIWA  at 6 a.m. was 12. He was given an ativan at 6 a.m. which he vomited immediately. He was given another ativan at 7 a.m. per provider. He has been sleeping fitfully for first part of shift. He took his medications in his room as he was unable to get out of bed. He is still feeling nausea, but he did not vomit his 10 a.m. pills. No complaints of pain on this shift. Will continue to monitor for safety.

## 2022-01-31 NOTE — ED Notes (Signed)
Pt A&O x 4, resting at present. Slightly anxious, cooperative.  Fluids offered. No distress noted.  Respirations even & unlabored.  Monitoring for safety.

## 2022-01-31 NOTE — ED Notes (Addendum)
Pt admitted to [facility] requesting for assistance with substance abuse issues and alcohol detox.  Patient was cooperative during the admission assessment. Skin assessment complete. Belongings in locker. Patient oriented to unit and unit rules. Meal and drinks offered to patient.  Patient verbalized agreement to treatment plans. Patient verbally contracts for safety while hospitalized. Will monitor for safety.

## 2022-01-31 NOTE — ED Notes (Signed)
Pt states nausea & abd.cramping remain the same.  Will reassess in 30 min.

## 2022-01-31 NOTE — ED Notes (Signed)
Pt refused food and drinks

## 2022-01-31 NOTE — ED Notes (Signed)
Pt presents to desk, requesting meds for opiate withdrawal.  NP Lindon Romp notified. Clonidine & Ativan given.  Pt then asked if he could be given Suboxone.  Explained to pt that med was not ordered.  Pt ambulated back to room, gait steady, no distress noted.  Monitoring for safety.

## 2022-01-31 NOTE — ED Notes (Signed)
Pt resting at present, no distress noted.  Monitoring for safety. 

## 2022-01-31 NOTE — ED Notes (Signed)
Pt is in the bed sleeping. Respirations are even and unlabored. No acute distress noted. Will continue to monitor for safety. 

## 2022-02-01 MED ORDER — SERTRALINE HCL 50 MG PO TABS: 50.0000 mg | ORAL_TABLET | Freq: Every day | ORAL | 0 refills | Status: DC

## 2022-02-01 MED ORDER — GABAPENTIN 300 MG PO CAPS: 600.0000 mg | ORAL_CAPSULE | Freq: Three times a day (TID) | ORAL | 0 refills | Status: DC

## 2022-02-01 MED ORDER — MIRTAZAPINE 30 MG PO TABS: 30.0000 mg | ORAL_TABLET | Freq: Every day | ORAL | 0 refills | Status: DC

## 2022-02-01 MED ORDER — HYDROXYZINE HCL 25 MG PO TABS: 25.0000 mg | ORAL_TABLET | Freq: Three times a day (TID) | ORAL | 0 refills | Status: DC | PRN

## 2022-02-01 MED ORDER — PROPRANOLOL HCL 10 MG PO TABS
10.0000 mg | ORAL_TABLET | Freq: Three times a day (TID) | ORAL | Status: DC | PRN
  Filled 2022-02-01: qty 15

## 2022-02-01 MED ORDER — PROMETHAZINE HCL 25 MG/ML IJ SOLN
25.0000 mg | Freq: Once | INTRAMUSCULAR | Status: AC
  Administered 2022-02-01: 25 mg via INTRAMUSCULAR
  Filled 2022-02-01: qty 1

## 2022-02-01 MED ORDER — PROPRANOLOL HCL 10 MG PO TABS: 10.0000 mg | ORAL_TABLET | Freq: Three times a day (TID) | ORAL | 0 refills | Status: DC | PRN

## 2022-02-01 MED ORDER — ONDANSETRON 4 MG PO TBDP: 4.0000 mg | ORAL_TABLET | Freq: Four times a day (QID) | ORAL | 0 refills | Status: AC | PRN

## 2022-02-01 NOTE — Discharge Instructions (Signed)
Take all medications as prescribed by his/her mental healthcare provider. Report any adverse effects and or reactions from the medicines to your outpatient provider promptly. Do not engage in alcohol and or illegal drug use while on prescription medicines. In the event of worsening symptoms, call the crisis hotline, 911 and or go to the nearest ED for appropriate evaluation and treatment of symptoms. follow-up with your primary care provider for your other medical issues, concerns and or health care needs.  If you  begin to have worsening symptoms of alcohol withdrawal (symptoms of alcohol withdrawal include: Tremulousness,anxiety, headache, sweating, palpitations, anorexia, gastrointestinal upset,  confusion, hallucination, seizures) please present to the ED for assistance as alcohol can be a medical emergency

## 2022-02-01 NOTE — ED Notes (Signed)
Pt vomited, greenish bile returns x 1.  Comfort measures given.  Will continue to monitor.

## 2022-02-01 NOTE — ED Notes (Signed)
Pt Aox4.  Denies SI, HI, and AVH.  Breathing is even and unlabored.  Minimal interaction with staff this morning.  Breakfast offered but pt declined at this time due to episodes of vomiting earlier.  PRN provided at 0317 by night shift nurse.  No reports of vomiting at this time.  Will encourage pt to drink fluids to assist with hydration. Pt states he is looking for long term rehab at this time.  Will continue to monitor for safety.

## 2022-02-01 NOTE — ED Notes (Signed)
Pt vomited x 1 in emesis bag.  Resting at present, will continue to monitor.

## 2022-02-01 NOTE — ED Notes (Signed)
Pt with provider
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
## 2022-02-01 NOTE — ED Notes (Signed)
Pt vomited x 1 in toilet.  Witnessed by MHT Autumn.  Comfort measures given.  NP Nira Conn at bedside to speak with pt.  Phenergan orders given.

## 2022-02-01 NOTE — ED Notes (Signed)
Pt. Keeps puking throughout the night.

## 2022-02-01 NOTE — ED Notes (Signed)
Pt resting at present, no distress noted. Respirations even & unlabored.  Monitoring for safety. °

## 2022-02-01 NOTE — Progress Notes (Signed)
CIWA and vs done prior to discharge, ciwa was 3 at 1115 hrs. He made several calls and stated his ride will arrive at 12 noon. He signed the AMA form, received his AVS and questions answered. He received medication samples, prescriptions and retrieved his personal items which included a large red suitcase and a bag. He was discharged to the lobby at 1150 hrs after getting dressed.

## 2022-02-01 NOTE — Progress Notes (Signed)
Received Mark Strickland this AM asleep in his bed awake. A double brown bag was placed at the bedside for his potential vomiting episodes. He asked for Suboxone, his plan of care was explained to him. Afterwards he verbalized his desire to be discharge because we cannot give him the medication he needs. Dr. aware of his desire to go and talked with him in depth related to his care. He decided to sign out AMA.

## 2022-04-26 ENCOUNTER — Encounter (HOSPITAL_COMMUNITY): Payer: Self-pay

## 2022-04-26 ENCOUNTER — Telehealth (HOSPITAL_COMMUNITY): Payer: No Payment, Other | Admitting: Psychiatry

## 2022-04-28 ENCOUNTER — Other Ambulatory Visit (HOSPITAL_COMMUNITY): Payer: Self-pay | Admitting: *Deleted

## 2022-04-28 DIAGNOSIS — F411 Generalized anxiety disorder: Secondary | ICD-10-CM

## 2022-04-28 DIAGNOSIS — F3341 Major depressive disorder, recurrent, in partial remission: Secondary | ICD-10-CM

## 2022-04-28 NOTE — Telephone Encounter (Signed)
Last VV-01/27/22 ?Last refill--02/01/22 ?Next appt--05/04/22 ?

## 2022-05-04 ENCOUNTER — Telehealth (INDEPENDENT_AMBULATORY_CARE_PROVIDER_SITE_OTHER): Payer: No Payment, Other | Admitting: Psychiatry

## 2022-05-04 DIAGNOSIS — F411 Generalized anxiety disorder: Secondary | ICD-10-CM | POA: Diagnosis not present

## 2022-05-04 DIAGNOSIS — F332 Major depressive disorder, recurrent severe without psychotic features: Secondary | ICD-10-CM

## 2022-05-04 NOTE — Progress Notes (Signed)
BH MD/PA/NP OP Progress Note  05/04/2022 5:00 PM Mark Strickland  MRN:  [id number]  Virtual Visit via Telephone Note  I connected with Mark Strickland on 05/05/22 at  4:00 PM EDT by telephone and verified that I am speaking with the correct person using two identifiers.  Location: Patient: home Provider: offsite   I discussed the limitations, risks, security and privacy concerns of performing an evaluation and management service by telephone and the availability of in person appointments. I also discussed with the patient that there may be a patient responsible charge related to this service. The patient expressed understanding and agreed to proceed.      I discussed the assessment and treatment plan with the patient. The patient was provided an opportunity to ask questions and all were answered. The patient agreed with the plan and demonstrated an understanding of the instructions.   The patient was advised to call back or seek an in-person evaluation if the symptoms worsen or if the condition fails to improve as anticipated.  I provided 5 minutes of non-face-to-face time during this encounter.   Mark Sober, NP   Chief Complaint: Medication management  HPI: Mark Strickland is a 31 year old male presenting to [facility] behavioral health outpatient for follow-up psychiatric evaluation.  Patient is being seen by this provider as his attending psychiatric provider is unavailable to complete today's appointment.  Patient has a psychiatric history of generalized anxiety disorder, major depressive disorder and poly substance induced mood disorder.  His symptoms are managed with gabapentin 600 mg 3 times daily, hydroxyzine 25 mg 3 times daily as needed for anxiety, mirtazapine 30 mg at bedtime, propanolol 10 mg 3 times daily as needed for anxiety, sertraline 50 mg daily.  Patient reports that his medications are effective with managing his symptoms and that he is medication compliant.   Patient denies adverse medication effects or need for dosage adjustment today.  No medication changes today.   Visit Diagnosis:    ICD-10-CM   1. Severe episode of recurrent major depressive disorder, without psychotic features (HCC)  F33.2     2. Generalized anxiety disorder  F41.1       Past Psychiatric History: Generalized anxiety disorder and major depressive disorder  Past Medical History:  Past Medical History:  Diagnosis Date   Anxiety    Depressed    Depression    Heroin abuse (HCC)    Opiate addiction (HCC)    Seizures (HCC)    childhood   Substance abuse (HCC)    History abuse hx. Clean for 6 months.    Past Surgical History:  Procedure Laterality Date   NO PAST SURGERIES      Family Psychiatric History: N/A  Family History:  Family History  Family history unknown: Yes    Social History:  Social History   Socioeconomic History   Marital status: Single    Spouse name: Not on file   Number of children: Not on file   Years of education: Not on file   Highest education level: Not on file  Occupational History   Occupation: "treework"  Tobacco Use   Smoking status: Every Day    Packs/day: 0.50    Types: Cigarettes   Smokeless tobacco: Never  Vaping Use   Vaping Use: Never used  Substance and Sexual Activity   Alcohol use: Not Currently   Drug use: Yes    Types: Heroin, Benzodiazepines, IV, Hydrocodone, Oxycodone    Comment: Heroin    Sexual activity: Yes  Other Topics Concern   Not on file  Social History Narrative   ** Merged History Encounter **       Social Determinants of Health   Financial Resource Strain: Not on file  Food Insecurity: Not on file  Transportation Needs: Not on file  Physical Activity: Not on file  Stress: Not on file  Social Connections: Not on file    Allergies: No Known Allergies  Metabolic Disorder Labs: Lab Results  Component Value Date   HGBA1C 5.0 01/31/2022   MPG 96.8 01/31/2022   No results found  for: PROLACTIN Lab Results  Component Value Date   CHOL 175 01/31/2022   TRIG 122 01/31/2022   HDL 43 01/31/2022   CHOLHDL 4.1 01/31/2022   VLDL 24 01/31/2022   LDLCALC 108 (H) 01/31/2022   Lab Results  Component Value Date   TSH 1.946 01/31/2022   TSH 0.320 (L) 08/18/2017    Therapeutic Level Labs: No results found for: LITHIUM No results found for: VALPROATE No components found for:  CBMZ  Current Medications: Current Outpatient Medications  Medication Sig Dispense Refill   gabapentin (NEURONTIN) 300 MG capsule Take 2 capsules (600 mg total) by mouth 3 (three) times daily. 90 capsule 0   hydrOXYzine (ATARAX) 25 MG tablet Take 1 tablet (25 mg total) by mouth 3 (three) times daily as needed. 30 tablet 0   mirtazapine (REMERON) 30 MG tablet Take 1 tablet (30 mg total) by mouth at bedtime. 30 tablet 0   ondansetron (ZOFRAN-ODT) 4 MG disintegrating tablet Take 1 tablet (4 mg total) by mouth every 6 (six) hours as needed for nausea or vomiting. 20 tablet 0   propranolol (INDERAL) 10 MG tablet Take 1 tablet (10 mg total) by mouth 3 (three) times daily as needed (anxiety). 30 tablet 0   sertraline (ZOLOFT) 50 MG tablet Take 1 tablet (50 mg total) by mouth daily. 30 tablet 0   No current facility-administered medications for this visit.     Musculoskeletal: Strength & Muscle Tone: n/a virtual visit Gait & Station: n/a Patient leans: N/A  Psychiatric Specialty Exam: Review of Systems  Psychiatric/Behavioral:  Negative for hallucinations, self-injury and suicidal ideas.   All other systems reviewed and are negative.  There were no vitals taken for this visit.There is no height or weight on file to calculate BMI.  General Appearance: NA  Eye Contact:  NA  Speech:  Clear and Coherent  Volume:  Normal  Mood:  Euthymic  Affect:  NA  Thought Process:  Goal Directed  Orientation:  Full (Time, Place, and Person)  Thought Content: Logical   Suicidal Thoughts:  No  Homicidal  Thoughts:  No  Memory: Good  Judgement:  Good  Insight:  Good  Psychomotor Activity:  NA  Concentration: Good  Recall:  Good  Fund of Knowledge: Good  Language: Good  Akathisia:  NA  Handed:  Right  AIMS (if indicated): not done  Assets:  Communication Skills  ADL's:  Intact  Cognition: WNL  Sleep:  Good   Screenings: AIMS    Flowsheet Row [facility]  AIMS Total Score 0      AUDIT    Flowsheet Row [facility]  Alcohol Use Disorder Identification Test Final Score (AUDIT) 0      GAD-7    Flowsheet Row Video Visit from 01/27/2022 in [facility] Video Visit  [facility]  Total GAD-7 Score 5 9 17 11       PHQ2-9    Flowsheet Row [facility]  PHQ-2 Total Score 1 1 6   PHQ-9 Total Score 4 6 18       Flowsheet [facility]  C-SSRS RISK CATEGORY No Risk No Risk No Risk        Assessment and Plan: Neta Ehlersicolas Fakman is a 31 year old [facility]up psychiatric evaluation.  Patient is being seen by this provider as his attending psychiatric provider is unavailable to complete today's appointment.  Patient has a psychiatric history of generalized anxiety disorder, major depressive  disorder and poly substance induced mood disorder.  His symptoms are managed with gabapentin 600 mg 3 times daily, hydroxyzine 25 mg 3 times daily as needed for anxiety, mirtazapine 30 mg at bedtime, propanolol 10 mg 3 times daily as needed for anxiety, sertraline 50 mg daily.  Patient reports that his medications are effective with managing his symptoms and that he is medication compliant.  Patient denies adverse medication effects or need for dosage adjustment today.  No medication changes today.  Medications refilled at current dosages.  Collaboration of Care: Collaboration of Care: Medication Management AEB medications E scribed to patient's pharmacy to bridge patient until he is able to follow-up with his attending psychiatric provider.  1. Severe episode of recurrent major depressive disorder, without psychotic features (HCC) Continue mirtazapine 30 mg daily at bedtime Continue Zoloft 50 mg daily 2. Generalized anxiety disorder - hydrOXYzine (ATARAX) 25 MG tablet; Take 1 tablet (25 mg total) by mouth 3 (three) times daily as needed.  Dispense: 30 tablet; Refill: 0  Continue gabapentin 600 mg 3 times daily Continue propanolol 10 mg 3 times daily as needed for anxiety  Return to care in 3 months   Patient/Guardian was advised Release of Information must be obtained prior to any record release in order to collaborate their care with an outside provider. Patient/Guardian was advised if they have not already done so to contact the registration department to sign all necessary forms in order for us to release information regarding their care.   Consent: Patient/Guardian gives verbal consent for treatment and assignment of benefits for services provided during this visit. Patient/Guardian expressed understanding and agreed to proceed.    Mark Sobericely Ervey Fallin, NP 05/05/2022, 9:24 AM

## 2022-05-05 MED ORDER — PROPRANOLOL HCL 10 MG PO TABS: 10.0000 mg | ORAL_TABLET | Freq: Three times a day (TID) | ORAL | 0 refills | Status: DC | PRN

## 2022-05-05 MED ORDER — HYDROXYZINE HCL 25 MG PO TABS: 25.0000 mg | ORAL_TABLET | Freq: Three times a day (TID) | ORAL | 0 refills | Status: DC | PRN

## 2022-05-05 MED ORDER — MIRTAZAPINE 30 MG PO TABS: 30.0000 mg | ORAL_TABLET | Freq: Every day | ORAL | 0 refills | Status: DC

## 2022-05-05 MED ORDER — GABAPENTIN 300 MG PO CAPS: 600.0000 mg | ORAL_CAPSULE | Freq: Three times a day (TID) | ORAL | 0 refills | Status: DC

## 2022-05-05 MED ORDER — SERTRALINE HCL 50 MG PO TABS: 50.0000 mg | ORAL_TABLET | Freq: Every day | ORAL | 0 refills | Status: DC

## 2022-05-06 ENCOUNTER — Telehealth (HOSPITAL_COMMUNITY): Payer: Self-pay | Admitting: *Deleted

## 2022-05-06 ENCOUNTER — Other Ambulatory Visit (HOSPITAL_COMMUNITY): Payer: Self-pay | Admitting: *Deleted

## 2022-05-06 DIAGNOSIS — F411 Generalized anxiety disorder: Secondary | ICD-10-CM

## 2022-05-06 DIAGNOSIS — F3341 Major depressive disorder, recurrent, in partial remission: Secondary | ICD-10-CM

## 2022-05-06 MED ORDER — HYDROXYZINE HCL 25 MG PO TABS: 25.0000 mg | ORAL_TABLET | Freq: Three times a day (TID) | ORAL | 0 refills | Status: DC | PRN

## 2022-05-06 MED ORDER — SERTRALINE HCL 50 MG PO TABS: 50.0000 mg | ORAL_TABLET | Freq: Every day | ORAL | 0 refills | Status: DC

## 2022-05-06 MED ORDER — PROPRANOLOL HCL 10 MG PO TABS: 10.0000 mg | ORAL_TABLET | Freq: Three times a day (TID) | ORAL | 0 refills | Status: DC | PRN

## 2022-05-06 MED ORDER — GABAPENTIN 300 MG PO CAPS: 600.0000 mg | ORAL_CAPSULE | Freq: Three times a day (TID) | ORAL | 0 refills | Status: DC

## 2022-05-06 MED ORDER — MIRTAZAPINE 30 MG PO TABS: 30.0000 mg | ORAL_TABLET | Freq: Every day | ORAL | 0 refills | Status: DC

## 2022-05-06 NOTE — Telephone Encounter (Signed)
Pt left VM that he's been out of medications for 9 days and has been calling [facility] and pharmacy to check on refill status and was told provider busy. Pt stated that he needs his medicine "before I end up in the hospital". Please review and respond. Thank you.

## 2022-05-06 NOTE — Telephone Encounter (Signed)
Scripts showing as sent on 05/05/22. However unable to see what pharmacy on the script. Pt has 6 pharmacies listed. Writer will attempt to founf out from pt which pharmacy and will confirm scripts ready.

## 2022-05-19 ENCOUNTER — Telehealth (HOSPITAL_COMMUNITY): Payer: Self-pay

## 2022-05-19 NOTE — Telephone Encounter (Signed)
Medication management - Telephone call with patient, after he left a message at the [facility] office reporting when he got his medications filled this month, they did not give him enough Gapabentin or Proprnaolol per day so he was running out early.  Patient stated he needed more medication to be sent into [facility] as he takes his Gabapentin 2 capsules, 3 times a day and was only given 90 capsules and also takes his propranolol three times a day and was only given 30 of those tablets. Informed patient Mcneil Sober, NP was no longer covering but would send request to Toy Cookey, NP who will see patient next on 08/04/22.  Patient stated he was only taking the Hydroxyzine 1 at night so the number 30 for that should be okay for now.  Agreed to send request to provider and gave patient the correct phone number for the [facility] outpatient for any needed future calls.

## 2022-05-20 ENCOUNTER — Other Ambulatory Visit (HOSPITAL_COMMUNITY): Payer: Self-pay | Admitting: Psychiatry

## 2022-05-20 DIAGNOSIS — F411 Generalized anxiety disorder: Secondary | ICD-10-CM

## 2022-05-20 DIAGNOSIS — F3341 Major depressive disorder, recurrent, in partial remission: Secondary | ICD-10-CM

## 2022-05-20 MED ORDER — PROPRANOLOL HCL 10 MG PO TABS: 10.0000 mg | ORAL_TABLET | Freq: Three times a day (TID) | ORAL | 3 refills | Status: DC

## 2022-05-20 MED ORDER — GABAPENTIN 300 MG PO CAPS: 600.0000 mg | ORAL_CAPSULE | Freq: Three times a day (TID) | ORAL | 3 refills | Status: DC

## 2022-05-20 NOTE — Telephone Encounter (Signed)
Medication management - Telephone message left for patient that his requested corrected prescriptions for Gabapentin and Propranolol had been sent into his [place] pharmacy by Toy Cookey, NP this date and to call back if any issues.

## 2022-05-20 NOTE — Telephone Encounter (Signed)
Medication refilled and sent to preferred pharmacy

## 2022-06-09 ENCOUNTER — Other Ambulatory Visit (HOSPITAL_COMMUNITY): Payer: Self-pay | Admitting: Psychiatry

## 2022-06-09 DIAGNOSIS — F411 Generalized anxiety disorder: Secondary | ICD-10-CM

## 2022-06-09 DIAGNOSIS — F3341 Major depressive disorder, recurrent, in partial remission: Secondary | ICD-10-CM

## 2022-07-11 ENCOUNTER — Telehealth (HOSPITAL_COMMUNITY): Payer: Self-pay | Admitting: *Deleted

## 2022-07-11 ENCOUNTER — Other Ambulatory Visit (HOSPITAL_COMMUNITY): Payer: Self-pay | Admitting: Psychiatry

## 2022-07-11 DIAGNOSIS — F411 Generalized anxiety disorder: Secondary | ICD-10-CM

## 2022-07-11 DIAGNOSIS — F3341 Major depressive disorder, recurrent, in partial remission: Secondary | ICD-10-CM

## 2022-07-11 MED ORDER — GABAPENTIN 300 MG PO CAPS: 600.0000 mg | ORAL_CAPSULE | Freq: Three times a day (TID) | ORAL | 1 refills | Status: DC

## 2022-07-11 MED ORDER — HYDROXYZINE HCL 25 MG PO TABS: 25.0000 mg | ORAL_TABLET | Freq: Three times a day (TID) | ORAL | 0 refills | Status: DC | PRN

## 2022-07-11 MED ORDER — PROPRANOLOL HCL 10 MG PO TABS: 10.0000 mg | ORAL_TABLET | Freq: Three times a day (TID) | ORAL | 1 refills | Status: DC

## 2022-07-11 NOTE — Telephone Encounter (Signed)
Dr Doyne Keel is Out of Office Sending Refill Request to Dr A. Pashayan   Patient LVM he's out of medication & asked for refills next appt is 08/04/22

## 2022-07-11 NOTE — Addendum Note (Signed)
Addended by: Lauro Franklin on: 07/11/2022 04:09 PM   Modules accepted: Orders

## 2022-07-11 NOTE — Telephone Encounter (Signed)
Contacted by patient that refills needed for his prescriptions.    Sent: -Gabapentin 600 mg TID. 180 capsules with 1 refill -Hydroxyzine 25 mg TID PRN. 30 tablets with 0 refills. -Propanolol 10 mg TID. 90 tablets with 1 refill.    Arna Snipe MD Resident

## 2022-07-14 NOTE — Telephone Encounter (Signed)
Contacted for refill son medications.  These were sent in.    Sent: -Remeron 30 mg QHS. 30 tablets with 0 refills. -Zoloft 50 mg daily.  30 tablets with 0 refills.    Arna Snipe MD Resident

## 2022-08-04 ENCOUNTER — Telehealth (INDEPENDENT_AMBULATORY_CARE_PROVIDER_SITE_OTHER): Payer: No Payment, Other | Admitting: Psychiatry

## 2022-08-04 ENCOUNTER — Encounter (HOSPITAL_COMMUNITY): Payer: Self-pay | Admitting: Psychiatry

## 2022-08-04 DIAGNOSIS — F411 Generalized anxiety disorder: Secondary | ICD-10-CM | POA: Diagnosis not present

## 2022-08-04 DIAGNOSIS — F3341 Major depressive disorder, recurrent, in partial remission: Secondary | ICD-10-CM | POA: Diagnosis not present

## 2022-08-04 MED ORDER — HYDROXYZINE HCL 25 MG PO TABS
25.0000 mg | ORAL_TABLET | Freq: Three times a day (TID) | ORAL | 3 refills | Status: DC | PRN
Start: 2022-08-04 — End: 2022-11-02

## 2022-08-04 MED ORDER — MIRTAZAPINE 30 MG PO TABS: 30.0000 mg | ORAL_TABLET | Freq: Every day | ORAL | 3 refills | Status: DC

## 2022-08-04 MED ORDER — GABAPENTIN 300 MG PO CAPS: 600.0000 mg | ORAL_CAPSULE | Freq: Three times a day (TID) | ORAL | 3 refills | Status: DC

## 2022-08-04 MED ORDER — SERTRALINE HCL 50 MG PO TABS: 50.0000 mg | ORAL_TABLET | Freq: Every day | ORAL | 3 refills | Status: DC

## 2022-08-04 MED ORDER — PROPRANOLOL HCL 10 MG PO TABS: 10.0000 mg | ORAL_TABLET | Freq: Three times a day (TID) | ORAL | 3 refills | Status: DC

## 2022-08-04 NOTE — Progress Notes (Signed)
[place] MD/PA/NP OP Progress Note Virtual Visit via Telephone Note  I connected with Mark Strickland on 08/04/22 at  1:30 PM EDT by telephone and verified that I am speaking with the correct person using two identifiers.  Location: Patient: home Provider: Clinic   I discussed the limitations, risks, security and privacy concerns of performing an evaluation and management service by telephone and the availability of in person appointments. I also discussed with the patient that there may be a patient responsible charge related to this service. The patient expressed understanding and agreed to proceed.   I provided 30 minutes of non-face-to-face time during this encounter.   08/04/2022 1:22 PM Mark Strickland  MRN:  [id number]  Chief Complaint: "My medications quantity was messed up at at my last visit "  HPI: 31 year old male seen today for follow up psychiatric evaluation. He has a psychiatric history of depression, anxiety, substance induced mood disorder, suicide attempt, and opioid use disorder (in remission). He is currently being managed on Zoloft 50mg  QD, Gabapentin 600mg  TID, Propranolol 10mg  three times daily, Hydroxyzine 25mg  TID, and  Remeron 30 mg at night.  He reports his medications are effective in managing his psychiatric conditions.   Today he was unable to logon virtually so his test was done over the phone.  During exam he was pleasant, cooperative, and engaged in conversation. He informed provider that overall he feels mentally stable however notes that at his last visit his medication quantities were messed up.  Provider insured patient that the medication quantity would correctly be filled today.  He endorsed understanding and agreed.  Patient informed writer that his mood is stable and reports that he has minimal anxiety and depression.  Provider conducted a GAD-7 and patient scored a 2.  Provider also conducted PHQ-9 and patient scored a 1.  He endorses adequate sleep and  appetite.  Today he denies SI/HI/VAH, mania, or paranoia.   No medication changes made today.  Patient agreeable to continue medications as prescribed.  No other concerns at this time     Visit Diagnosis:    ICD-10-CM   1. Generalized anxiety disorder  F41.1 gabapentin (NEURONTIN) 300 MG capsule    sertraline (ZOLOFT) 50 MG tablet    propranolol (INDERAL) 10 MG tablet    mirtazapine (REMERON) 30 MG tablet    2. Recurrent major depressive disorder, in partial remission (HCC)  F33.41 gabapentin (NEURONTIN) 300 MG capsule    sertraline (ZOLOFT) 50 MG tablet    propranolol (INDERAL) 10 MG tablet    mirtazapine (REMERON) 30 MG tablet      Past Psychiatric History: depression, anxiety, substance induced mood disorder, suicide attempt, and opioid use disorder  Past Medical History:  Past Medical History:  Diagnosis Date   Anxiety    Depressed    Depression    Heroin abuse ([place])    Opiate addiction ([place])    Seizures ([place])    childhood   Substance abuse ([place])    History abuse hx. Clean for 6 months.    Past Surgical History:  Procedure Laterality Date   NO PAST SURGERIES      Family Psychiatric History: Anxiety, depression, Substance induced mood disorder (opiates)  Family History:  Family History  Family history unknown: Yes    Social History:  Social History   Socioeconomic History   Marital status: Single    Spouse name: Not on file   Number of children: Not on file   Years of education: Not on file  Highest education level: Not on file  Occupational History   Occupation: "treework"  Tobacco Use   Smoking status: Every Day    Packs/day: 0.50    Types: Cigarettes   Smokeless tobacco: Never  Vaping Use   Vaping Use: Never used  Substance and Sexual Activity   Alcohol use: Not Currently   Drug use: Yes    Types: Heroin, Benzodiazepines, IV, Hydrocodone, Oxycodone    Comment: Heroin    Sexual activity: Yes  Other Topics Concern   Not on file  Social  History Narrative   ** Merged History Encounter **       Social Determinants of Health   Financial Resource Strain: Not on file  Food Insecurity: Not on file  Transportation Needs: Not on file  Physical Activity: Not on file  Stress: Not on file  Social Connections: Not on file    Allergies: No Known Allergies  Metabolic Disorder Labs: Lab Results  Component Value Date   HGBA1C 5.0 01/31/2022   MPG 96.8 01/31/2022   No results found for: "PROLACTIN" Lab Results  Component Value Date   CHOL 175 01/31/2022   TRIG 122 01/31/2022   HDL 43 01/31/2022   CHOLHDL 4.1 01/31/2022   VLDL 24 01/31/2022   LDLCALC 108 (H) 01/31/2022   Lab Results  Component Value Date   TSH 1.946 01/31/2022   TSH 0.320 (L) 08/18/2017    Therapeutic Level Labs: No results found for: "LITHIUM" No results found for: "VALPROATE" No results found for: "CBMZ"  Current Medications: Current Outpatient Medications  Medication Sig Dispense Refill   gabapentin (NEURONTIN) 300 MG capsule Take 2 capsules (600 mg total) by mouth 3 (three) times daily. 180 capsule 3   hydrOXYzine (ATARAX) 25 MG tablet Take 1 tablet (25 mg total) by mouth 3 (three) times daily as needed. 90 tablet 3   mirtazapine (REMERON) 30 MG tablet Take 1 tablet (30 mg total) by mouth at bedtime. 30 tablet 3   ondansetron (ZOFRAN-ODT) 4 MG disintegrating tablet Take 1 tablet (4 mg total) by mouth every 6 (six) hours as needed for nausea or vomiting. 20 tablet 0   propranolol (INDERAL) 10 MG tablet Take 1 tablet (10 mg total) by mouth 3 (three) times daily. 90 tablet 3   sertraline (ZOLOFT) 50 MG tablet Take 1 tablet (50 mg total) by mouth daily. 30 tablet 3   No current facility-administered medications for this visit.     Musculoskeletal: Strength & Muscle Tone:  Unable to assess due to telephone visit Gait & Station:  Unable to assess due to telephone visit Patient leans: N/A  Psychiatric Specialty Exam: Review of Systems   There were no vitals taken for this visit.There is no height or weight on file to calculate BMI.  General Appearance:  Unable to assess due to telephone visit  Eye Contact:   Unable to assess due to telephone visit  Speech:  Clear and Coherent and Normal Rate  Volume:  Normal  Mood:  Euthymic and Notes that he becomes anxious and depressed due to life stressors  Affect:  Appropriate and Congruent  Thought Process:  Coherent, Goal Directed and Linear  Orientation:  Full (Time, Place, and Person)  Thought Content: WDL and Logical   Suicidal Thoughts:  No  Homicidal Thoughts:  No  Memory:  Immediate;   Good Recent;   Good Remote;   Good  Judgement:  Good  Insight:  Good  Psychomotor Activity:   Unable to assess due to  telephone visit  Concentration:  Concentration: Good and Attention Span: Good  Recall:  Good  Fund of Knowledge: Good  Language: Good  Akathisia:  No  Handed:  Right  AIMS (if indicated): Not done  Assets:  Communication Skills Desire for Improvement Financial Resources/Insurance Housing Social Support  ADL's:  Intact  Cognition: WNL  Sleep:  Good   Screenings: AIMS    Flowsheet Row [facility]  AIMS Total Score 0      AUDIT    Flowsheet Row Ad[facility]  Alcohol Use Disorder Identification Test Final Score (AUDIT) 0      GAD-7    Flowsheet Row Video Visit from 08/04/2022 in [facility] Video Visit from 01/27/2022 in [facility] Video Visit from 05/06/2021 in [facility] Video Visit from 02/11/2021 in [facility] Video Visit from 10/13/2020 in [facility]  Total GAD-7 Score 2 5 9 17 11       PHQ2-9    Flowsheet Row Video Visit from 08/04/2022 in [facility] Video Visit from 01/27/2022 in [facility] Video Visit from 05/06/2021 in [facility] Video Visit from 02/11/2021 in [facility]  PHQ-2 Total Score 0 1 1 6   PHQ-9 Total Score 1 4 6 18       Flowsheet Row ED from 01/31/2022 in [facility] ED from 01/30/2022 in [facility] Video Visit from 01/27/2022 in [facility]  C-SSRS RISK CATEGORY No Risk No Risk No Risk        Assessment and Plan: Patient reports that he is doing well on his current medication regimen.  No medication changes made today.  Patient agreeable to continue medication as prescribed.  1. Generalized anxiety disorder  Continue- gabapentin (NEURONTIN) 300 MG capsule; Take 2 capsules (600 mg total) by mouth 3 (three) times daily.  Dispense: 180 capsule; Refill: 3 Continue- sertraline (ZOLOFT) 50 MG tablet; Take 1 tablet (50 mg total) by mouth daily.  Dispense: 30 tablet; Refill: 3 Continue- propranolol (INDERAL) 10 MG tablet; Take 1 tablet (10 mg total) by mouth 3 (three) times daily.  Dispense: 90 tablet; Refill: 3 Continue- mirtazapine (REMERON) 30 MG tablet; Take 1 tablet (30 mg total) by mouth at bedtime.  Dispense: 30 tablet; Refill: 3  2. Recurrent major depressive disorder, in partial remission (HCC)  Continue- gabapentin (NEURONTIN) 300 MG capsule; Take 2 capsules (600 mg total) by mouth 3 (three) times daily.  Dispense: 180 capsule; Refill: 3 Continue- sertraline (ZOLOFT) 50 MG tablet; Take 1 tablet (50 mg total) by mouth daily.  Dispense: 30 tablet; Refill: 3 Continue- propranolol (INDERAL) 10 MG tablet; Take 1 tablet (10 mg total) by mouth 3 (three) times daily.  Dispense: 90 tablet; Refill: 3 Continue- mirtazapine (REMERON) 30 MG tablet; Take 1 tablet (30 mg total) by mouth at bedtime.  Dispense: 30 tablet; Refill:  3  Follow-up in 3 months Follow-up with therapy [place], NP 08/04/2022, 1:22 PM

## 2022-10-31 ENCOUNTER — Telehealth (HOSPITAL_COMMUNITY): Payer: No Payment, Other | Admitting: Psychiatry

## 2022-11-02 ENCOUNTER — Telehealth (INDEPENDENT_AMBULATORY_CARE_PROVIDER_SITE_OTHER): Payer: No Payment, Other | Admitting: Psychiatry

## 2022-11-02 ENCOUNTER — Encounter (HOSPITAL_COMMUNITY): Payer: Self-pay | Admitting: Psychiatry

## 2022-11-02 DIAGNOSIS — F3341 Major depressive disorder, recurrent, in partial remission: Secondary | ICD-10-CM

## 2022-11-02 DIAGNOSIS — F411 Generalized anxiety disorder: Secondary | ICD-10-CM

## 2022-11-02 MED ORDER — HYDROXYZINE HCL 25 MG PO TABS: 25.0000 mg | ORAL_TABLET | Freq: Three times a day (TID) | ORAL | 3 refills | Status: DC | PRN

## 2022-11-02 MED ORDER — MIRTAZAPINE 30 MG PO TABS: 30.0000 mg | ORAL_TABLET | Freq: Every day | ORAL | 3 refills | Status: DC

## 2022-11-02 MED ORDER — SERTRALINE HCL 50 MG PO TABS: 50.0000 mg | ORAL_TABLET | Freq: Every day | ORAL | 3 refills | Status: DC

## 2022-11-02 MED ORDER — GABAPENTIN 300 MG PO CAPS: 600.0000 mg | ORAL_CAPSULE | Freq: Three times a day (TID) | ORAL | 3 refills | Status: DC

## 2022-11-02 MED ORDER — PROPRANOLOL HCL 10 MG PO TABS: 10.0000 mg | ORAL_TABLET | Freq: Three times a day (TID) | ORAL | 3 refills | Status: DC

## 2022-11-02 NOTE — Progress Notes (Signed)
BH MD/PA/NP OP Progress Note Virtual Visit via Telephone Note  I connected with Mark Strickland on 11/02/22 at  1:30 PM EST by telephone and verified that I am speaking with the correct person using two identifiers.  Location: Patient: Work Provider: Clinic   I discussed the limitations, risks, security and privacy concerns of performing an evaluation and management service by telephone and the availability of in person appointments. I also discussed with the patient that there may be a patient responsible charge related to this service. The patient expressed understanding and agreed to proceed.   I provided 30 minutes of non-face-to-face time during this encounter.   11/02/2022 11:24 AM Mark Strickland  MRN:  [id number]  Chief Complaint: "Everything has been fine"  HPI: 31 year old male seen today for follow up psychiatric evaluation. He has a psychiatric history of depression, anxiety, substance induced mood disorder, suicide attempt, and opioid use disorder (in remission). He is currently being managed on Zoloft 50mg  QD, Gabapentin 600mg  TID, Propranolol 10mg  three times daily, Hydroxyzine 25mg  TID, and  Remeron 30 mg at night.  He reports his medications are effective in managing his psychiatric conditions.   Today he was unable to logon virtually so his test was done over the phone.  During exam he was pleasant, cooperative, and engaged in conversation. He informed provider that everything has been fine.  He notes that since his last visit not much is changed.  Patient notes that he continues to keep active by working and going to the gym.  He notes his mood is stable and reports he has minimal anxiety and depression.  Provider conducted a GAD-7 and patient scored a 1, at his last visit he scored a 2.  Provider also conducted PHQ-9 he scored a 1, at his last visit he scored a 1.  He endorses adequate sleep and appetite.  Today he denies SI/HI/VAH, mania, or paranoia.   No medication  changes made today.  Patient agreeable to continue medications as prescribed.  No other concerns at this time     Visit Diagnosis:    ICD-10-CM   1. Generalized anxiety disorder  F41.1 gabapentin (NEURONTIN) 300 MG capsule    mirtazapine (REMERON) 30 MG tablet    propranolol (INDERAL) 10 MG tablet    sertraline (ZOLOFT) 50 MG tablet    2. Recurrent major depressive disorder, in partial remission (HCC)  F33.41 gabapentin (NEURONTIN) 300 MG capsule    mirtazapine (REMERON) 30 MG tablet    propranolol (INDERAL) 10 MG tablet    sertraline (ZOLOFT) 50 MG tablet      Past Psychiatric History: depression, anxiety, substance induced mood disorder, suicide attempt, and opioid use disorder  Past Medical History:  Past Medical History:  Diagnosis Date   Anxiety    Depressed    Depression    Heroin abuse (HCC)    Opiate addiction (HCC)    Seizures (HCC)    childhood   Substance abuse (HCC)    History abuse hx. Clean for 6 months.    Past Surgical History:  Procedure Laterality Date   NO PAST SURGERIES      Family Psychiatric History: Anxiety, depression, Substance induced mood disorder (opiates)  Family History:  Family History  Family history unknown: Yes    Social History:  Social History   Socioeconomic History   Marital status: Single    Spouse name: Not on file   Number of children: Not on file   Years of education: Not on file  Highest education level: Not on file  Occupational History   Occupation: "treework"  Tobacco Use   Smoking status: Every Day    Packs/day: 0.50    Types: Cigarettes   Smokeless tobacco: Never  Vaping Use   Vaping Use: Never used  Substance and Sexual Activity   Alcohol use: Not Currently   Drug use: Yes    Types: Heroin, Benzodiazepines, IV, Hydrocodone, Oxycodone    Comment: Heroin    Sexual activity: Yes  Other Topics Concern   Not on file  Social History Narrative   ** Merged History Encounter **       Social Determinants  of Health   Financial Resource Strain: Not on file  Food Insecurity: Not on file  Transportation Needs: Not on file  Physical Activity: Not on file  Stress: Not on file  Social Connections: Not on file    Allergies: No Known Allergies  Metabolic Disorder Labs: Lab Results  Component Value Date   HGBA1C 5.0 01/31/2022   MPG 96.8 01/31/2022   No results found for: "PROLACTIN" Lab Results  Component Value Date   CHOL 175 01/31/2022   TRIG 122 01/31/2022   HDL 43 01/31/2022   CHOLHDL 4.1 01/31/2022   VLDL 24 01/31/2022   LDLCALC 108 (H) 01/31/2022   Lab Results  Component Value Date   TSH 1.946 01/31/2022   TSH 0.320 (L) 08/18/2017    Therapeutic Level Labs: No results found for: "LITHIUM" No results found for: "VALPROATE" No results found for: "CBMZ"  Current Medications: Current Outpatient Medications  Medication Sig Dispense Refill   gabapentin (NEURONTIN) 300 MG capsule Take 2 capsules (600 mg total) by mouth 3 (three) times daily. 180 capsule 3   hydrOXYzine (ATARAX) 25 MG tablet Take 1 tablet (25 mg total) by mouth 3 (three) times daily as needed. 90 tablet 3   mirtazapine (REMERON) 30 MG tablet Take 1 tablet (30 mg total) by mouth at bedtime. 30 tablet 3   ondansetron (ZOFRAN-ODT) 4 MG disintegrating tablet Take 1 tablet (4 mg total) by mouth every 6 (six) hours as needed for nausea or vomiting. 20 tablet 0   propranolol (INDERAL) 10 MG tablet Take 1 tablet (10 mg total) by mouth 3 (three) times daily. 90 tablet 3   sertraline (ZOLOFT) 50 MG tablet Take 1 tablet (50 mg total) by mouth daily. 30 tablet 3   No current facility-administered medications for this visit.     Musculoskeletal: Strength & Muscle Tone:  Unable to assess due to telephone visit Gait & Station:  Unable to assess due to telephone visit Patient leans: N/A  Psychiatric Specialty Exam: Review of Systems  There were no vitals taken for this visit.There is no height or weight on file to  calculate BMI.  General Appearance:  Unable to assess due to telephone visit  Eye Contact:   Unable to assess due to telephone visit  Speech:  Clear and Coherent and Normal Rate  Volume:  Normal  Mood:  Euthymic  Affect:  Appropriate and Congruent  Thought Process:  Coherent, Goal Directed and Linear  Orientation:  Full (Time, Place, and Person)  Thought Content: WDL and Logical   Suicidal Thoughts:  No  Homicidal Thoughts:  No  Memory:  Immediate;   Good Recent;   Good Remote;   Good  Judgement:  Good  Insight:  Good  Psychomotor Activity:   Unable to assess due to telephone visit  Concentration:  Concentration: Good and Attention Span: Good  Recall:  Good  Fund of Knowledge: Good  Language: Good  Akathisia:  No  Handed:  Right  AIMS (if indicated): Not done  Assets:  Communication Skills Desire for Improvement Financial Resources/Insurance Housing Social Support  ADL's:  Intact  Cognition: WNL  Sleep:  Good   Screenings: AIMS    Flowsheet Row [facility]  AIMS Total Score 0      AUDIT    Flowsheet Row [facility]  Alcohol Use Disorder Identification Test Final Score (AUDIT) 0      GAD-7    Flowsheet Row Video Visit from 11/02/2022 in [facility] Video Visit from 08/04/2022 in [facility] Video Visit from 01/27/2022 in [facility] Video Visit from 05/06/2021 in [facility] Video Visit from 02/11/2021 in [facility]  Total GAD-7 Score 1 2 5 9 17       PHQ2-9    Flowsheet Row Video Visit from 11/02/2022 in [facility] Video Visit from 08/04/2022 in [facility] Video Visit from 01/27/2022 in [facility] Video Visit from 05/06/2021 in [facility] Video Visit from 02/11/2021 in [facility]  PHQ-2 Total Score 0 0 1 1 6   PHQ-9 Total Score 1 1 4 6 18       Flowsheet Row ED from 01/31/2022 in [facility] ED from 01/30/2022 in [facility] Video Visit from 01/27/2022 in [facility]  C-SSRS RISK CATEGORY No Risk No Risk No Risk        Assessment and Plan: Patient reports that he is doing well on his current medication regimen.  No medication changes made today.  Patient agreeable to continue medication as prescribed.  1. Generalized anxiety disorder  Continue- gabapentin (NEURONTIN) 300 MG capsule; Take 2 capsules (600 mg total) by mouth 3 (three) times daily.  Dispense: 180 capsule; Refill: 3 Continue- sertraline (ZOLOFT) 50 MG tablet; Take 1 tablet (50 mg total) by mouth daily.  Dispense: 30 tablet; Refill: 3 Continue- propranolol (INDERAL) 10 MG tablet; Take 1 tablet (10 mg total) by mouth 3 (three) times daily.  Dispense: 90 tablet; Refill: 3 Continue- mirtazapine (REMERON) 30 MG tablet; Take 1 tablet (30 mg total) by mouth at bedtime.  Dispense: 30 tablet; Refill: 3  2. Recurrent major depressive disorder, in partial remission (HCC)  Continue- gabapentin (NEURONTIN) 300 MG capsule; Take 2 capsules (600 mg total) by mouth 3 (three) times daily.  Dispense: 180 capsule; Refill: 3 Continue- sertraline (ZOLOFT) 50 MG tablet; Take 1 tablet (50 mg total) by mouth daily.  Dispense: 30 tablet; Refill: 3 Continue- propranolol (INDERAL) 10 MG tablet; Take 1 tablet (10 mg total) by mouth 3 (three) times daily.  Dispense: 90 tablet; Refill: 3 Continue- mirtazapine (REMERON) 30 MG tablet; Take 1 tablet (30 mg total) by mouth at bedtime.  Dispense: 30 tablet; Refill: 3  Follow-up in 3 months Follow-up with therapy [place], NP 11/02/2022, 11:24  AM

## 2023-01-24 ENCOUNTER — Encounter (HOSPITAL_COMMUNITY): Payer: Self-pay | Admitting: Psychiatry

## 2023-01-24 ENCOUNTER — Telehealth (INDEPENDENT_AMBULATORY_CARE_PROVIDER_SITE_OTHER): Payer: No Payment, Other | Admitting: Psychiatry

## 2023-01-24 DIAGNOSIS — F3341 Major depressive disorder, recurrent, in partial remission: Secondary | ICD-10-CM | POA: Diagnosis not present

## 2023-01-24 DIAGNOSIS — F411 Generalized anxiety disorder: Secondary | ICD-10-CM | POA: Diagnosis not present

## 2023-01-24 MED ORDER — HYDROXYZINE HCL 25 MG PO TABS: 25.0000 mg | ORAL_TABLET | Freq: Three times a day (TID) | ORAL | 3 refills | Status: DC | PRN

## 2023-01-24 MED ORDER — PROPRANOLOL HCL 10 MG PO TABS: 10.0000 mg | ORAL_TABLET | Freq: Three times a day (TID) | ORAL | 3 refills | Status: DC

## 2023-01-24 MED ORDER — SERTRALINE HCL 50 MG PO TABS: 50.0000 mg | ORAL_TABLET | Freq: Every day | ORAL | 3 refills | Status: DC

## 2023-01-24 MED ORDER — MIRTAZAPINE 45 MG PO TABS: 45.0000 mg | ORAL_TABLET | Freq: Every day | ORAL | 3 refills | Status: DC

## 2023-01-24 MED ORDER — GABAPENTIN 300 MG PO CAPS: 600.0000 mg | ORAL_CAPSULE | Freq: Three times a day (TID) | ORAL | 3 refills | Status: DC

## 2023-01-24 NOTE — Progress Notes (Signed)
BH MD/PA/NP OP Progress Note Virtual Visit via Video Note  I connected with Mark Strickland on 01/24/23 at  8:30 AM EST by a video enabled telemedicine application and verified that I am speaking with the correct person using two identifiers.  Location: Patient: Home Provider: Clinic   I discussed the limitations of evaluation and management by telemedicine and the availability of in person appointments. The patient expressed understanding and agreed to proceed.  I provided 30 minutes of non-face-to-face time during this encounter.    01/24/2023 12:13 PM Mark Strickland  MRN:  [id number]  Chief Complaint: "I have been more anxious"  HPI: 32 year old male seen today for follow up psychiatric evaluation. He has a psychiatric history of depression, anxiety, substance induced mood disorder, suicide attempt, and opioid use disorder (in remission). He is currently being managed on Zoloft 50mg  QD, Gabapentin 600mg  TID, Propranolol 10mg  three times daily, Hydroxyzine 25mg  TID, and  Remeron 30 mg at night.  He reports his medications are somewhat effective in managing his psychiatric conditions.   Today he was logged in virtually but his microphone did not work. Provider used phone and virtual outlet to conduct visit.  Provider was able to see patient and he  was well groomed, pleasant, cooperative, and engaged in conversation. He informed provider that everything has been more anxious. He notes that his anxiety worsens in social settings. Patient denies recent life changes or stressors.  He notes that work is going well.  Today provider conducted a GAD-7 and patient scored a 9, at his last visit he scored a 1.  Provider also conducted PHQ-9 and patient scored a 2, at his last visit he scored a 1.   He endorses adequate sleep and appetite.  Today he denies SI/HI/VAH, mania, or paranoia.   Today patient agreeable to increasing mirtazapine 30 mg to 45 mg to help manage anxiety.  He will continue all other  medications as prescribed.  No other concerns at this time     Visit Diagnosis:    ICD-10-CM   1. Generalized anxiety disorder  F41.1 gabapentin (NEURONTIN) 300 MG capsule    mirtazapine (REMERON) 45 MG tablet    propranolol (INDERAL) 10 MG tablet    sertraline (ZOLOFT) 50 MG tablet    2. Recurrent major depressive disorder, in partial remission (HCC)  F33.41 gabapentin (NEURONTIN) 300 MG capsule    mirtazapine (REMERON) 45 MG tablet    propranolol (INDERAL) 10 MG tablet    sertraline (ZOLOFT) 50 MG tablet       Past Psychiatric History: depression, anxiety, substance induced mood disorder, suicide attempt, and opioid use disorder  Past Medical History:  Past Medical History:  Diagnosis Date   Anxiety    Depressed    Depression    Heroin abuse ([place])    Opiate addiction ([place])    Seizures ([place])    childhood   Substance abuse ([place])    History abuse hx. Clean for 6 months.    Past Surgical History:  Procedure Laterality Date   NO PAST SURGERIES      Family Psychiatric History: Anxiety, depression, Substance induced mood disorder (opiates)  Family History:  Family History  Family history unknown: Yes    Social History:  Social History   Socioeconomic History   Marital status: Single    Spouse name: Not on file   Number of children: Not on file   Years of education: Not on file   Highest education level: Not on file  Occupational  History   Occupation: "treework"  Tobacco Use   Smoking status: Every Day    Packs/day: 0.50    Types: Cigarettes   Smokeless tobacco: Never  Vaping Use   Vaping Use: Never used  Substance and Sexual Activity   Alcohol use: Not Currently   Drug use: Yes    Types: Heroin, Benzodiazepines, IV, Hydrocodone, Oxycodone    Comment: Heroin    Sexual activity: Yes  Other Topics Concern   Not on file  Social History Narrative   ** Merged History Encounter **       Social Determinants of Health   Financial Resource Strain: Not on  file  Food Insecurity: Not on file  Transportation Needs: Not on file  Physical Activity: Not on file  Stress: Not on file  Social Connections: Not on file    Allergies: No Known Allergies  Metabolic Disorder Labs: Lab Results  Component Value Date   HGBA1C 5.0 01/31/2022   MPG 96.8 01/31/2022   No results found for: "PROLACTIN" Lab Results  Component Value Date   CHOL 175 01/31/2022   TRIG 122 01/31/2022   HDL 43 01/31/2022   CHOLHDL 4.1 01/31/2022   VLDL 24 01/31/2022   LDLCALC 108 (H) 01/31/2022   Lab Results  Component Value Date   TSH 1.946 01/31/2022   TSH 0.320 (L) 08/18/2017    Therapeutic Level Labs: No results found for: "LITHIUM" No results found for: "VALPROATE" No results found for: "CBMZ"  Current Medications: Current Outpatient Medications  Medication Sig Dispense Refill   gabapentin (NEURONTIN) 300 MG capsule Take 2 capsules (600 mg total) by mouth 3 (three) times daily. 180 capsule 3   hydrOXYzine (ATARAX) 25 MG tablet Take 1 tablet (25 mg total) by mouth 3 (three) times daily as needed. 90 tablet 3   mirtazapine (REMERON) 45 MG tablet Take 1 tablet (45 mg total) by mouth at bedtime. 30 tablet 3   ondansetron (ZOFRAN-ODT) 4 MG disintegrating tablet Take 1 tablet (4 mg total) by mouth every 6 (six) hours as needed for nausea or vomiting. 20 tablet 0   propranolol (INDERAL) 10 MG tablet Take 1 tablet (10 mg total) by mouth 3 (three) times daily. 90 tablet 3   sertraline (ZOLOFT) 50 MG tablet Take 1 tablet (50 mg total) by mouth daily. 30 tablet 3   No current facility-administered medications for this visit.     Musculoskeletal: Strength & Muscle Tone: within normal limits, telehealth visit Gait & Station: normal, telehealth visit Patient leans: N/A  Psychiatric Specialty Exam: Review of Systems  There were no vitals taken for this visit.There is no height or weight on file to calculate BMI.  General Appearance: Well Groomed  Eye Contact:   Good  Speech:  Clear and Coherent and Normal Rate  Volume:  Normal  Mood:  Anxious  Affect:  Appropriate and Congruent  Thought Process:  Coherent, Goal Directed and Linear  Orientation:  Full (Time, Place, and Person)  Thought Content: WDL and Logical   Suicidal Thoughts:  No  Homicidal Thoughts:  No  Memory:  Immediate;   Good Recent;   Good Remote;   Good  Judgement:  Good  Insight:  Good  Psychomotor Activity:  Normal  Concentration:  Concentration: Good and Attention Span: Good  Recall:  Good  Fund of Knowledge: Good  Language: Good  Akathisia:  No  Handed:  Right  AIMS (if indicated): Not done  Assets:  Communication Skills Desire for Improvement Financial Resources/Insurance Housing  Social Support  ADL's:  Intact  Cognition: WNL  Sleep:  Good   Screenings: AIMS    Flowsheet Row Admission (Discharged) from 03/22/2019 in [facility]  AIMS Total Score 0      AUDIT    Flowsheet Row Admission (Discharged) from 03/22/2019 in [place] [facility]  Alcohol Use Disorder Identification Test Final Score (AUDIT) 0      GAD-7    Flowsheet Row Video Visit from 11/02/2022 in [facility] Video Visit from 08/04/2022 in [facility] Video Visit from 01/27/2022 in [facility] Video Visit from 05/06/2021 in [facility] Video Visit from 02/11/2021 in [facility]  Total GAD-7 Score 1 2 5 9 17       PHQ2-9    Flowsheet Row Video Visit from 11/02/2022 in [facility] Video Visit from 08/04/2022 in [facility] Video Visit from 01/27/2022 in [facility] Video Visit from 05/06/2021 in [facility] Video Visit from 02/11/2021 in [facility]  PHQ-2 Total  Score 0 0 1 1 6   PHQ-9 Total Score 1 1 4 6 18       Flowsheet Row ED from 01/31/2022 in [facility] ED from 01/30/2022 in [facility] Video Visit from 01/27/2022 in [place] No Risk No Risk No Risk        Assessment and Plan: Patient reports that he has been more anxious in social settings.  Today mirtazapine 30 mg increased to 45 mg to help manage anxiety 1. Generalized anxiety disorder  Continue- gabapentin (NEURONTIN) 300 MG capsule; Take 2 capsules (600 mg total) by mouth 3 (three) times daily.  Dispense: 180 capsule; Refill: 3 Continue- sertraline (ZOLOFT) 50 MG tablet; Take 1 tablet (50 mg total) by mouth daily.  Dispense: 30 tablet; Refill: 3 Continue- propranolol (INDERAL) 10 MG tablet; Take 1 tablet (10 mg total) by mouth 3 (three) times daily.  Dispense: 90 tablet; Refill: 3 Increased- mirtazapine (REMERON) 45 MG tablet; Take 1 tablet (45mg  total) by mouth at bedtime.  Dispense: 30 tablet; Refill: 3  2. Recurrent major depressive disorder, in partial remission (HCC)  Continue- gabapentin (NEURONTIN) 300 MG capsule; Take 2 capsules (600 mg total) by mouth 3 (three) times daily.  Dispense: 180 capsule; Refill: 3 Continue- sertraline (ZOLOFT) 50 MG tablet; Take 1 tablet (50 mg total) by mouth daily.  Dispense: 30 tablet; Refill: 3 Continue- propranolol (INDERAL) 10 MG tablet; Take 1 tablet (10 mg total) by mouth 3 (three) times daily.  Dispense: 90 tablet; Refill: 3 Increased- mirtazapine (REMERON) 45 MG tablet; Take 1 tablet (45 mg total) by mouth at bedtime.  Dispense: 30 tablet; Refill: 3  Follow-up in 3 months Follow-up with therapy Salley Slaughter, NP 01/24/2023, 12:13 PM

## 2023-01-25 ENCOUNTER — Telehealth (HOSPITAL_COMMUNITY): Payer: No Payment, Other | Admitting: Psychiatry

## 2023-04-07 ENCOUNTER — Telehealth (INDEPENDENT_AMBULATORY_CARE_PROVIDER_SITE_OTHER): Payer: No Payment, Other | Admitting: Psychiatry

## 2023-04-07 ENCOUNTER — Encounter (HOSPITAL_COMMUNITY): Payer: Self-pay | Admitting: Psychiatry

## 2023-04-07 DIAGNOSIS — F411 Generalized anxiety disorder: Secondary | ICD-10-CM

## 2023-04-07 DIAGNOSIS — F3341 Major depressive disorder, recurrent, in partial remission: Secondary | ICD-10-CM | POA: Diagnosis not present

## 2023-04-07 MED ORDER — GABAPENTIN 300 MG PO CAPS: 600.0000 mg | ORAL_CAPSULE | Freq: Three times a day (TID) | ORAL | 3 refills | Status: DC

## 2023-04-07 MED ORDER — MIRTAZAPINE 45 MG PO TABS: 45.0000 mg | ORAL_TABLET | Freq: Every day | ORAL | 3 refills | Status: DC

## 2023-04-07 MED ORDER — PROPRANOLOL HCL 10 MG PO TABS: 10.0000 mg | ORAL_TABLET | Freq: Three times a day (TID) | ORAL | 3 refills | Status: DC

## 2023-04-07 MED ORDER — HYDROXYZINE HCL 25 MG PO TABS: 25.0000 mg | ORAL_TABLET | Freq: Three times a day (TID) | ORAL | 3 refills | Status: DC | PRN

## 2023-04-07 NOTE — Progress Notes (Signed)
BH MD/PA/NP OP Progress Note Virtual Visit via Video Note  I connected with Mark Strickland on 04/07/23 at 10:00 AM EDT by a video enabled telemedicine application and verified that I am speaking with the correct person using two identifiers.  Location: Patient: Home Provider: Clinic   I discussed the limitations of evaluation and management by telemedicine and the availability of in person appointments. The patient expressed understanding and agreed to proceed.  I provided 30 minutes of non-face-to-face time during this encounter.    04/07/2023 10:07 AM Mark Strickland  MRN:  [id number]  Chief Complaint: "I would like to stop the Zoloft and Mirtazapine"  HPI: 32 year old male seen today for follow up psychiatric evaluation. He has a psychiatric history of depression, anxiety, substance induced mood disorder, suicide attempt, and opioid use disorder (in remission). He is currently being managed on Zoloft 50mg  QD, Gabapentin 600mg  TID, Propranolol 10mg  three times daily, Hydroxyzine 25mg  TID, and  Remeron 45 mg at night.  He reports his medications are somewhat effective in managing his psychiatric conditions.   Today he was well groomed, pleasant, cooperative, engaged in conversation and maintained eye contact.  He informed provider that he would like to discontinue Zoloft and Mirtazapine noting that he does not find them as effective. Patient reports that he becomes anxious in social settings but is learning how to cope with it. He requested Gabapentin or propranolol be increased. Provider informed patient that Gabapentin could not be increased as it is currently at the highest reccommended level. Provider discussed potential side effect of hypotension with increasing propranolol and patient notes that he could cope with his social anxiety. Provider also informed patient that hydroxyzine could be increased but he notes that he could cope.  Today patient reports that his anxiety and depression  are minimal. Provider conducted a GAD 7 and patient scored a 4, at his last visit he scored a 9.   Provider also conducted PHQ-9 and patient scored a 3, at his last visit he scored a 2.   He endorses adequate sleep and appetite.  Today he denies SI/HI/VAH, mania, or paranoia.   Today patient wishes to discontinue Zoloft and Mirtazapine. Provider instructed patient to discontinue Zoloft first by cutting does in half for a few days and then stopping. Provider recommended patient continuing mirtazapine for awhile and then cut does if half for a few days prior to discontinuing. He endorsed understanding and agreed.  No other concerns at this time     Visit Diagnosis:    ICD-10-CM   1. Generalized anxiety disorder  F41.1 gabapentin (NEURONTIN) 300 MG capsule    mirtazapine (REMERON) 45 MG tablet    propranolol (INDERAL) 10 MG tablet    2. Recurrent major depressive disorder, in partial remission  F33.41 gabapentin (NEURONTIN) 300 MG capsule    mirtazapine (REMERON) 45 MG tablet    propranolol (INDERAL) 10 MG tablet       Past Psychiatric History: depression, anxiety, substance induced mood disorder, suicide attempt, and opioid use disorder  Past Medical History:  Past Medical History:  Diagnosis Date   Anxiety    Depressed    Depression    Heroin abuse    Opiate addiction    Seizures    childhood   Substance abuse    History abuse hx. Clean for 6 months.    Past Surgical History:  Procedure Laterality Date   NO PAST SURGERIES      Family Psychiatric History: Anxiety, depression, Substance induced mood disorder (  opiates)  Family History:  Family History  Family history unknown: Yes    Social History:  Social History   Socioeconomic History   Marital status: Single    Spouse name: Not on file   Number of children: Not on file   Years of education: Not on file   Highest education level: Not on file  Occupational History   Occupation: "treework"  Tobacco Use   Smoking  status: Every Day    Packs/day: .5    Types: Cigarettes   Smokeless tobacco: Never  Vaping Use   Vaping Use: Never used  Substance and Sexual Activity   Alcohol use: Not Currently   Drug use: Yes    Types: Heroin, Benzodiazepines, IV, Hydrocodone, Oxycodone    Comment: Heroin    Sexual activity: Yes  Other Topics Concern   Not on file  Social History Narrative   ** Merged History Encounter **       Social Determinants of Health   Financial Resource Strain: Not on file  Food Insecurity: Not on file  Transportation Needs: Not on file  Physical Activity: Not on file  Stress: Not on file  Social Connections: Not on file    Allergies: No Known Allergies  Metabolic Disorder Labs: Lab Results  Component Value Date   HGBA1C 5.0 01/31/2022   MPG 96.8 01/31/2022   No results found for: "PROLACTIN" Lab Results  Component Value Date   CHOL 175 01/31/2022   TRIG 122 01/31/2022   HDL 43 01/31/2022   CHOLHDL 4.1 01/31/2022   VLDL 24 01/31/2022   LDLCALC 108 (H) 01/31/2022   Lab Results  Component Value Date   TSH 1.946 01/31/2022   TSH 0.320 (L) 08/18/2017    Therapeutic Level Labs: No results found for: "LITHIUM" No results found for: "VALPROATE" No results found for: "CBMZ"  Current Medications: Current Outpatient Medications  Medication Sig Dispense Refill   gabapentin (NEURONTIN) 300 MG capsule Take 2 capsules (600 mg total) by mouth 3 (three) times daily. 180 capsule 3   hydrOXYzine (ATARAX) 25 MG tablet Take 1 tablet (25 mg total) by mouth 3 (three) times daily as needed. 90 tablet 3   mirtazapine (REMERON) 45 MG tablet Take 1 tablet (45 mg total) by mouth at bedtime. 30 tablet 3   ondansetron (ZOFRAN-ODT) 4 MG disintegrating tablet Take 1 tablet (4 mg total) by mouth every 6 (six) hours as needed for nausea or vomiting. 20 tablet 0   propranolol (INDERAL) 10 MG tablet Take 1 tablet (10 mg total) by mouth 3 (three) times daily. 90 tablet 3   No current  facility-administered medications for this visit.     Musculoskeletal: Strength & Muscle Tone: within normal limits, telehealth visit Gait & Station: normal, telehealth visit Patient leans: N/A  Psychiatric Specialty Exam: Review of Systems  There were no vitals taken for this visit.There is no height or weight on file to calculate BMI.  General Appearance: Well Groomed  Eye Contact:  Good  Speech:  Clear and Coherent and Normal Rate  Volume:  Normal  Mood:  Anxious  Affect:  Appropriate and Congruent  Thought Process:  Coherent, Goal Directed and Linear  Orientation:  Full (Time, Place, and Person)  Thought Content: WDL and Logical   Suicidal Thoughts:  No  Homicidal Thoughts:  No  Memory:  Immediate;   Good Recent;   Good Remote;   Good  Judgement:  Good  Insight:  Good  Psychomotor Activity:  Normal  Concentration:  Concentration: Good and Attention Span: Good  Recall:  Good  Fund of Knowledge: Good  Language: Good  Akathisia:  No  Handed:  Right  AIMS (if indicated): Not done  Assets:  Communication Skills Desire for Improvement Financial Resources/Insurance Housing Social Support  ADL's:  Intact  Cognition: WNL  Sleep:  Good   Screenings: AIMS    Flowsheet Row [facility]  AIMS Total Score 0      AUDIT    Flowsheet Row [facility]  Alcohol Use Disorder Identification Test Final Score (AUDIT) 0      GAD-7    Flowsheet Row Video Visit from 04/07/2023 in [facility] Video Visit from 01/24/2023 in [facility] Video Visit from 11/02/2022 in [facility] Video Visit from 08/04/2022 in [facility] Video Visit from 01/27/2022 in [facility]  Total GAD-7 Score PHQ2-9    Flowsheet Row Video Visit from 04/07/2023 in [facility] Video Visit from 01/24/2023 in [facility] Video Visit from 11/02/2022 in [facility] Video Visit from 08/04/2022 in [facility] Video Visit from 01/27/2022 in [facility]  PHQ-2 Total Score 1 0 0 0 1  PHQ-9 Total Score Flowsheet Row ED from 01/31/2022 in [facility] ED from 01/30/2022 in [facility] Emergency Department [facility] Video Visit from 01/27/2022 in [facility]  C-SSRS RISK CATEGORY No Risk No Risk No Risk        Assessment and Plan: Patient reports that he has been more anxious in social settings.   He requested Gabapentin or propranolol be increased. Provider informed patient that Gabapentin could not be increased as it is currently at the highest reccommended level. Provider discussed potential side effect of hypotension with increasing propranolol and patient notes that he could cope with his social anxiety. Provider also informed patient that hydroxyzine could be increased but he notes that he could cope. At this time patient wishes to discontinue Zoloft and Mirtazapine. Provider instructed patient to discontinue Zoloft first by cutting does in half for a few days and then stopping. Provider recommended patient continuing mirtazapine for awhile and then cut does if half for a few days prior to discontinuing. He endorsed understanding and agreed.    1. Generalized anxiety disorder  Continue- gabapentin (NEURONTIN) 300 MG capsule; Take 2 capsules (600 mg total) by mouth 3 (three) times daily.  Dispense: 180 capsule; Refill: 3 Continue- propranolol (INDERAL) 10 MG tablet; Take 1 tablet (10 mg total) by mouth 3 (three) times daily.  Dispense: 90 tablet; Refill: 3 Continue- mirtazapine (REMERON)  45 MG tablet; Take 1 tablet (  total) by mouth at bedtime.  Dispense: 30 tablet; Refill: 3  2. Recurrent major depressive disorder, in partial remission (HCC)  Continue- gabapentin (NEURONTIN) 300 MG capsule; Take 2 capsules (600 mg total) by mouth 3 (three) times daily.  Dispense: 180 capsule; Refill: 3 Continue- propranolol (INDERAL) 10 MG tablet; Take 1 tablet (10 mg total) by mouth 3 (three) times daily.  Dispense: 90 tablet; Refill: 3 Continue- mirtazapine (REMERON) 45 MG tablet; Take 1 tablet (45 mg total) by  mouth at bedtime.  Dispense: 30 tablet; Refill: 3  Follow-up in 2 months Follow-up with therapy Shanna Cisco, NP 04/07/2023, 10:07 AM

## 2023-06-09 ENCOUNTER — Telehealth (INDEPENDENT_AMBULATORY_CARE_PROVIDER_SITE_OTHER): Payer: No Payment, Other | Admitting: Psychiatry

## 2023-06-09 ENCOUNTER — Encounter (HOSPITAL_COMMUNITY): Payer: Self-pay | Admitting: Psychiatry

## 2023-06-09 DIAGNOSIS — F411 Generalized anxiety disorder: Secondary | ICD-10-CM | POA: Diagnosis not present

## 2023-06-09 DIAGNOSIS — F3341 Major depressive disorder, recurrent, in partial remission: Secondary | ICD-10-CM

## 2023-06-09 MED ORDER — PROPRANOLOL HCL 20 MG PO TABS
20.0000 mg | ORAL_TABLET | Freq: Two times a day (BID) | ORAL | 3 refills | Status: DC
Start: 2023-06-09 — End: 2023-08-25

## 2023-06-09 MED ORDER — HYDROXYZINE HCL 25 MG PO TABS: 25.0000 mg | ORAL_TABLET | Freq: Three times a day (TID) | ORAL | 3 refills | Status: DC | PRN

## 2023-06-09 MED ORDER — GABAPENTIN 300 MG PO CAPS
600.0000 mg | ORAL_CAPSULE | Freq: Three times a day (TID) | ORAL | 3 refills | Status: DC
Start: 2023-06-09 — End: 2023-08-25

## 2023-06-09 NOTE — Progress Notes (Signed)
BH MD/PA/NP OP Progress Note Virtual Visit via Video Note  I connected with Mark Strickland on 06/09/23 at 10:00 AM EDT by a video enabled telemedicine application and verified that I am speaking with the correct person using two identifiers.  Location: Patient: Home Provider: Clinic   I discussed the limitations of evaluation and management by telemedicine and the availability of in person appointments. The patient expressed understanding and agreed to proceed.  I provided 30 minutes of non-face-to-face time during this encounter.    06/09/2023 10:07 AM Mark Strickland  MRN:  [id number]  Chief Complaint: "I still have anxiety"  HPI: 32 year old male seen today for follow up psychiatric evaluation. He has a psychiatric history of depression, anxiety, substance induced mood disorder, suicide attempt, and opioid use disorder (in remission 18 months). He is currently being managed on, Gabapentin 600mg  TID, Propranolol 10mg  three times daily, and Hydroxyzine 25mg  TID.  He reports his medications are somewhat effective in managing his psychiatric conditions.   Today he was well groomed, pleasant, cooperative, engaged in conversation and maintained eye contact.  He informed provider that continues to have anxiety specifically in social settings. He requested Gabapentin or propranolol be increased. Provider informed patient that Gabapentin could no longer be increase and discussed the risk of increasing propranolol. Today provider conducted a GAD 7 and patient scored a 4, at his last visit he scored a  4. Provider also conducted PHQ-9 and patient scored a 6, at his last visit he scored a 3 He endorsed adequate sleep and appetite. Today he denies SI/HI/VAH, mania or paranoia.    Patient notes that he has been sober from illegal substances for 18 months.   Provider discussed potential side effect of hypotension with increasing propranolol. Patient endorsed understanding but notes that he would like to  try it to help manage his social anxiety. He notes that hydroxyzine makes him sleepy and disliked antidepressants. Today Propanolol increased from 10 mg three times daily to 20 mg twice daily. He will continue all other medications as prescribed.   No other concerns at this time     Visit Diagnosis:    ICD-10-CM   1. Generalized anxiety disorder  F41.1 propranolol (INDERAL) 20 MG tablet    gabapentin (NEURONTIN) 300 MG capsule    2. Recurrent major depressive disorder, in partial remission (HCC)  F33.41 propranolol (INDERAL) 20 MG tablet    gabapentin (NEURONTIN) 300 MG capsule       Past Psychiatric History: depression, anxiety, substance induced mood disorder, suicide attempt, and opioid use disorder  Past Medical History:  Past Medical History:  Diagnosis Date   Anxiety    Depressed    Depression    Heroin abuse (HCC)    Opiate addiction (HCC)    Seizures (HCC)    childhood   Substance abuse (HCC)    History abuse hx. Clean for 6 months.    Past Surgical History:  Procedure Laterality Date   NO PAST SURGERIES      Family Psychiatric History: Anxiety, depression, Substance induced mood disorder (opiates)  Family History:  Family History  Family history unknown: Yes    Social History:  Social History   Socioeconomic History   Marital status: Single    Spouse name: Not on file   Number of children: Not on file   Years of education: Not on file   Highest education level: Not on file  Occupational History   Occupation: "treework"  Tobacco Use   Smoking status: Every  Day    Packs/day: .5    Types: Cigarettes   Smokeless tobacco: Never  Vaping Use   Vaping Use: Never used  Substance and Sexual Activity   Alcohol use: Not Currently   Drug use: Yes    Types: Heroin, Benzodiazepines, IV, Hydrocodone, Oxycodone    Comment: Heroin    Sexual activity: Yes  Other Topics Concern   Not on file  Social History Narrative   ** Merged History Encounter **        Social Determinants of Health   Financial Resource Strain: Not on file  Food Insecurity: Not on file  Transportation Needs: Not on file  Physical Activity: Not on file  Stress: Not on file  Social Connections: Not on file    Allergies: No Known Allergies  Metabolic Disorder Labs: Lab Results  Component Value Date   HGBA1C 5.0 01/31/2022   MPG 96.8 01/31/2022   No results found for: "PROLACTIN" Lab Results  Component Value Date   CHOL 175 01/31/2022   TRIG 122 01/31/2022   HDL 43 01/31/2022   CHOLHDL 4.1 01/31/2022   VLDL 24 01/31/2022   LDLCALC 108 (H) 01/31/2022   Lab Results  Component Value Date   TSH 1.946 01/31/2022   TSH 0.320 (L) 08/18/2017    Therapeutic Level Labs: No results found for: "LITHIUM" No results found for: "VALPROATE" No results found for: "CBMZ"  Current Medications: Current Outpatient Medications  Medication Sig Dispense Refill   gabapentin (NEURONTIN) 300 MG capsule Take 2 capsules (600 mg total) by mouth 3 (three) times daily. 180 capsule 3   hydrOXYzine (ATARAX) 25 MG tablet Take 1 tablet (25 mg total) by mouth 3 (three) times daily as needed. 90 tablet 3   ondansetron (ZOFRAN-ODT) 4 MG disintegrating tablet Take 1 tablet (4 mg total) by mouth every 6 (six) hours as needed for nausea or vomiting. 20 tablet 0   propranolol (INDERAL) 20 MG tablet Take 1 tablet (20 mg total) by mouth 2 (two) times daily. 60 tablet 3   No current facility-administered medications for this visit.     Musculoskeletal: Strength & Muscle Tone: within normal limits, telehealth visit Gait & Station: normal, telehealth visit Patient leans: N/A  Psychiatric Specialty Exam: Review of Systems  There were no vitals taken for this visit.There is no height or weight on file to calculate BMI.  General Appearance: Well Groomed  Eye Contact:  Good  Speech:  Clear and Coherent and Normal Rate  Volume:  Normal  Mood:  Anxious in social settings  Affect:   Appropriate and Congruent  Thought Process:  Coherent, Goal Directed and Linear  Orientation:  Full (Time, Place, and Person)  Thought Content: WDL and Logical   Suicidal Thoughts:  No  Homicidal Thoughts:  No  Memory:  Immediate;   Good Recent;   Good Remote;   Good  Judgement:  Good  Insight:  Good  Psychomotor Activity:  Normal  Concentration:  Concentration: Good and Attention Span: Good  Recall:  Good  Fund of Knowledge: Good  Language: Good  Akathisia:  No  Handed:  Right  AIMS (if indicated): Not done  Assets:  Communication Skills Desire for Improvement Financial Resources/Insurance Housing Social Support  ADL's:  Intact  Cognition: WNL  Sleep:  Good   Screenings: AIMS    Flowsheet Row [facility]  AIMS Total Score 0      AUDIT    Flowsheet Row  Ad[facility]  Alcohol Use Disorder Identification Test Final Score (AUDIT) 0      GAD-7    Flowsheet Row Video Visit from 06/09/2023 in [facility] Video Visit from 04/07/2023 in [facility] Video Visit from 01/24/2023 in [facility] Video Visit from 11/02/2022 in [facility] Video Visit from 08/04/2022 in [facility]  Total GAD-7 Score 4 4 8 1 2       PHQ2-9    Flowsheet Row Video Visit from 06/09/2023 in [facility] Video Visit from 04/07/2023 in [facility] Video Visit from 01/24/2023 in [facility] Video Visit from 11/02/2022 in [facility] Video Visit from 08/04/2022 in [facility]  PHQ-2 Total Score 1 1 0 0 0  PHQ-9 Total Score 6 3 2 1 1       Flowsheet Row ED from 01/31/2022 in [facility] ED from 01/30/2022 in [facility] Emergency Department at [facility] Video Visit from 01/27/2022 in [facility]  C-SSRS RISK CATEGORY No Risk No Risk No Risk        Assessment and Plan: Patient reports that he has been more anxious in social settings.   He requested Gabapentin or propranolol be increased. Provider informed patient that Gabapentin could not be increased as it is currently at the highest reccommended level. Provider discussed potential side effect of hypotension with increasing propranolol. Patient endorsed understanding but notes that he would like to try it to help manage his social anxiety. He notes that hydroxyzine makes him sleepy and disliked antidepressants. Today Propanolol increased from 10 mg three times daily to 20 mg twice daily. He will continue all other medications as prescribed.   1. Generalized anxiety disorder  Increased- propranolol (INDERAL) 20 MG tablet; Take 1 tablet (20 mg total) by mouth 2 (two) times daily.  Dispense: 60 tablet; Refill: 3 Continue- gabapentin (NEURONTIN) 300 MG capsule; Take 2 capsules (600 mg total) by mouth 3 (three) times daily.  Dispense: 180 capsule; Refill: 3  2. Recurrent major depressive disorder, in partial remission (HCC)  Increased- propranolol (INDERAL) 20 MG tablet; Take 1 tablet (20 mg total) by mouth 2 (two) times daily.  Dispense: 60 tablet; Refill: 3 Continue- gabapentin (NEURONTIN) 300 MG capsule; Take 2 capsules (600 mg total) by mouth 3 (three) times daily.  Dispense: 180 capsule; Refill: 3    Follow-up in 2 months Follow-up with therapy Shanna Cisco, NP 06/09/2023, 10:07 AM

## 2023-08-25 ENCOUNTER — Telehealth (INDEPENDENT_AMBULATORY_CARE_PROVIDER_SITE_OTHER): Payer: MEDICAID | Admitting: Psychiatry

## 2023-08-25 ENCOUNTER — Encounter (HOSPITAL_COMMUNITY): Payer: Self-pay | Admitting: Psychiatry

## 2023-08-25 DIAGNOSIS — F3341 Major depressive disorder, recurrent, in partial remission: Secondary | ICD-10-CM | POA: Diagnosis not present

## 2023-08-25 DIAGNOSIS — F411 Generalized anxiety disorder: Secondary | ICD-10-CM | POA: Diagnosis not present

## 2023-08-25 DIAGNOSIS — Z0000 Encounter for general adult medical examination without abnormal findings: Secondary | ICD-10-CM

## 2023-08-25 MED ORDER — PROPRANOLOL HCL 20 MG PO TABS
20.0000 mg | ORAL_TABLET | Freq: Two times a day (BID) | ORAL | 3 refills | Status: DC
Start: 2023-08-25 — End: 2023-11-02

## 2023-08-25 MED ORDER — GABAPENTIN 300 MG PO CAPS
600.0000 mg | ORAL_CAPSULE | Freq: Three times a day (TID) | ORAL | 3 refills | Status: DC
Start: 2023-08-25 — End: 2023-11-02

## 2023-08-25 MED ORDER — HYDROXYZINE HCL 25 MG PO TABS
25.0000 mg | ORAL_TABLET | Freq: Three times a day (TID) | ORAL | 3 refills | Status: DC | PRN
Start: 2023-08-25 — End: 2023-11-02

## 2023-08-25 NOTE — Progress Notes (Signed)
BH MD/PA/NP OP Progress Note Virtual Visit via Video Note  I connected with Mark Strickland on 08/25/23 at  8:30 AM EDT by a video enabled telemedicine application and verified that I am speaking with the correct person using two identifiers.  Location: Patient: Home Provider: Clinic   I discussed the limitations of evaluation and management by telemedicine and the availability of in person appointments. The patient expressed understanding and agreed to proceed.  I provided 30 minutes of non-face-to-face time during this encounter.    08/25/2023 8:51 AM Mark Strickland  MRN:  [id number]  Chief Complaint: "Work has been stressful"  HPI: 32 year old male seen today for follow up psychiatric evaluation. He has a psychiatric history of depression, anxiety, substance induced mood disorder, suicide attempt, and opioid use disorder (in remission 18 months). He is currently being managed on, Gabapentin 600mg  TID, Propranolol 20 mg twice daily, and Hydroxyzine 25mg  TID (takes nightly as it is sedating).  He reports his medications are somewhat effective in managing his psychiatric conditions.   Today he was well groomed, pleasant, cooperative, engaged in conversation and maintained eye contact.  He informed provider that work has been stressful. He notes that due to his increase stress he has been having ringing in his ear.  He notes that this has been going on for the last week or so.  Provider asked patient when the last time he saw his PCP.  He notes that currently he does not have a PCP.  Patient referred to community health and wellness for primary care.  Patient also informed Clinical research associate that he does not have [employer].  Patient referred to Ms. Mark Strickland [facility] caseworker for assistance with his Medicaid application.    Since his last visit he notes that his mood has been stable but notes that he has minimal anxiety and depression.  Today provider conducted a GAD 7 and patient scored a 3, at his  last visit he scored a  4. Provider also conducted PHQ-9 and patient scored a 2, at his last visit he scored a 6. He endorsed adequate sleep and appetite. Today he denies SI/HI/VAH, mania or paranoia.    Patient notes that he believes he has carpal tunnel.  He notes that this pain can range between 3-6 out of 10.  At this time he informed writer that he is not taking medications to help manage it.   Provider informed patient that propranolol on rare occasion can cause tinnitus.  He however notes that he believes his tinnitus is stress-induced.  Patient referred to community health and wellness for primary care.  No medication changes made today.  Patient agreeable to continue medications as prescribed.   No other concerns at this time     Visit Diagnosis:    ICD-10-CM   1. Well adult exam  Z00.00 Ambulatory referral to Internal Medicine    2. Generalized anxiety disorder  F41.1 gabapentin (NEURONTIN) 300 MG capsule    propranolol (INDERAL) 20 MG tablet    hydrOXYzine (ATARAX) 25 MG tablet    3. Recurrent major depressive disorder, in partial remission (HCC)  F33.41 gabapentin (NEURONTIN) 300 MG capsule    propranolol (INDERAL) 20 MG tablet        Past Psychiatric History: depression, anxiety, substance induced mood disorder, suicide attempt, and opioid use disorder  Past Medical History:  Past Medical History:  Diagnosis Date   Anxiety    Depressed    Depression    Heroin abuse (HCC)  Opiate addiction (HCC)    Seizures (HCC)    childhood   Substance abuse (HCC)    History abuse hx. Clean for 6 months.    Past Surgical History:  Procedure Laterality Date   NO PAST SURGERIES      Family Psychiatric History: Anxiety, depression, Substance induced mood disorder (opiates)  Family History:  Family History  Family history unknown: Yes    Social History:  Social History   Socioeconomic History   Marital status: Single    Spouse name: Not on file   Number of children:  Not on file   Years of education: Not on file   Highest education level: Not on file  Occupational History   Occupation: "treework"  Tobacco Use   Smoking status: Every Day    Current packs/day: 0.50    Types: Cigarettes   Smokeless tobacco: Never  Vaping Use   Vaping status: Never Used  Substance and Sexual Activity   Alcohol use: Not Currently   Drug use: Yes    Types: Heroin, Benzodiazepines, IV, Hydrocodone, Oxycodone    Comment: Heroin    Sexual activity: Yes  Other Topics Concern   Not on file  Social History Narrative   ** Merged History Encounter **       Social Determinants of Health   Financial Resource Strain: Not on file  Food Insecurity: Not on file  Transportation Needs: Not on file  Physical Activity: Not on file  Stress: Not on file  Social Connections: Not on file    Allergies: No Known Allergies  Metabolic Disorder Labs: Lab Results  Component Value Date   HGBA1C 5.0 01/31/2022   MPG 96.8 01/31/2022   No results found for: "PROLACTIN" Lab Results  Component Value Date   CHOL 175 01/31/2022   TRIG 122 01/31/2022   HDL 43 01/31/2022   CHOLHDL 4.1 01/31/2022   VLDL 24 01/31/2022   LDLCALC 108 (H) 01/31/2022   Lab Results  Component Value Date   TSH 1.946 01/31/2022   TSH 0.320 (L) 08/18/2017    Therapeutic Level Labs: No results found for: "LITHIUM" No results found for: "VALPROATE" No results found for: "CBMZ"  Current Medications: Current Outpatient Medications  Medication Sig Dispense Refill   gabapentin (NEURONTIN) 300 MG capsule Take 2 capsules (600 mg total) by mouth 3 (three) times daily. 180 capsule 3   hydrOXYzine (ATARAX) 25 MG tablet Take 1 tablet (25 mg total) by mouth 3 (three) times daily as needed. 90 tablet 3   ondansetron (ZOFRAN-ODT) 4 MG disintegrating tablet Take 1 tablet (4 mg total) by mouth every 6 (six) hours as needed for nausea or vomiting. 20 tablet 0   propranolol (INDERAL) 20 MG tablet Take 1 tablet (20  mg total) by mouth 2 (two) times daily. 60 tablet 3   No current facility-administered medications for this visit.     Musculoskeletal: Strength & Muscle Tone: within normal limits, telehealth visit Gait & Station: normal, telehealth visit Patient leans: N/A  Psychiatric Specialty Exam: Review of Systems  There were no vitals taken for this visit.There is no height or weight on file to calculate BMI.  General Appearance: Well Groomed  Eye Contact:  Good  Speech:  Clear and Coherent and Normal Rate  Volume:  Normal  Mood:  Euthymic  Affect:  Appropriate and Congruent  Thought Process:  Coherent, Goal Directed and Linear  Orientation:  Full (Time, Place, and Person)  Thought Content: WDL and Logical   Suicidal Thoughts:  No  Homicidal Thoughts:  No  Memory:  Immediate;   Good Recent;   Good Remote;   Good  Judgement:  Good  Insight:  Good  Psychomotor Activity:  Normal  Concentration:  Concentration: Good and Attention Span: Good  Recall:  Good  Fund of Knowledge: Good  Language: Good  Akathisia:  No  Handed:  Right  AIMS (if indicated): Not done  Assets:  Communication Skills Desire for Improvement Financial Resources/Insurance Housing Social Support  ADL's:  Intact  Cognition: WNL  Sleep:  Good   Screenings: AIMS    Flowsheet Row [facility]  AIMS Total Score 0      AUDIT    Flowsheet Row [facility]  Alcohol Use Disorder Identification Test Final Score (AUDIT) 0      GAD-7    Flowsheet Row Video Visit from 08/25/2023 in [facility] Video Visit from 06/09/2023 in [facility] Video Visit from 04/07/2023 in [facility] Video Visit from 01/24/2023 in [facility] Video Visit from 11/02/2022 in [facility]  Total GAD-7 Score 3 4 4 8 1       PHQ2-9    Flowsheet Row Video Visit from 08/25/2023 in [facility] Video Visit from 06/09/2023 in [facility] Video Visit from 04/07/2023 in [facility] Video Visit from 01/24/2023 in [facility] Video Visit from 11/02/2022 in [facility]  PHQ-2 Total Score 1 1 1  0 0  PHQ-9 Total Score 2 6 3 2 1       Flowsheet Row ED from 01/31/2022 in [facility] ED from 01/30/2022 in [facility] Emergency Department at [facility] Video Visit from 01/27/2022 in [facility]  C-SSRS RISK CATEGORY No Risk No Risk No Risk        Assessment and Plan: Patient reports that his anxiety depression are well-managed.  He does note that he has been experiencing tendinitis for the last week Provider informed patient that propranolol on rare occasion can cause tinnitus.  He however notes that he believes his tinnitus is stress-induced.  Patient referred to community health and wellness for primary care.  No medication changes made today.  Patient agreeable to continue medications as prescribed  1. Well adult exam  - Ambulatory referral to Internal Medicine  2. Generalized anxiety disorder  Continue- gabapentin (NEURONTIN) 300 MG capsule; Take 2 capsules (600 mg total) by mouth 3 (three) times daily.  Dispense: 180 capsule; Refill: 3 Continue- propranolol (INDERAL) 20 MG tablet; Take 1 tablet (20 mg total) by mouth 2 (two) times daily.  Dispense: 60 tablet; Refill: 3 Continue- hydrOXYzine (ATARAX) 25 MG tablet; Take 1 tablet (25 mg total) by mouth 3 (three) times daily as needed.  Dispense: 90 tablet; Refill: 3  3. Recurrent major depressive disorder, in partial remission (HCC)  Continue- gabapentin (NEURONTIN) 300 MG capsule; Take 2 capsules (600  mg total) by mouth 3 (three) times daily.  Dispense: 180 capsule; Refill: 3 Continue- propranolol (INDERAL) 20 MG tablet; Take 1 tablet (20 mg total) by mouth 2 (two) times daily.  Dispense: 60 tablet; Refill: 3   Follow-up in 2 months Follow-up with therapy Shanna Cisco, NP 08/25/2023, 8:51 AM

## 2023-11-02 NOTE — Progress Notes (Signed)
BH MD/PA/NP OP Progress Note Virtual Visit via Video Note  I connected with Mark Strickland on 11/03/23 at 10:30 AM EST by a video enabled telemedicine application and verified that I am speaking with the correct person using two identifiers.  Location: Patient: Home Provider: Clinic   I discussed the limitations of evaluation and management by telemedicine and the availability of in person appointments. The patient expressed understanding and agreed to proceed.  I provided 30 minutes of non-face-to-face time during this encounter.    11/03/2023 10:30 AM Mark Strickland  MRN:  [id number]  Chief Complaint: "Can my propranolol be 10 mg four times daily"  HPI: 32 year old male seen today for follow up psychiatric evaluation. He has a psychiatric history of depression, anxiety, substance induced mood disorder, suicide attempt, and opioid use disorder (in remission). He is currently being managed on, Gabapentin 600mg  TID, Propranolol 20 mg twice daily, and Hydroxyzine 25mg  TID (takes nightly as it is sedating).  He reports his medications are effective in managing his psychiatric conditions.   Today he was well groomed, pleasant, cooperative, engaged in conversation and maintained eye contact.  He informed provider that at times he pops his propranolol in half.  He notes that he prefers to have 10 mg 4 times daily and requests writer reordered it.  Provider was agreeable to this.  Since his last visit he informed writer that his anxiety and depression continues to be well-managed.  Provider conducted a GAD-7 and patient scored a 4, at his last visit he scored a 3.  Provider also conducted PHQ-9 and patient scored 3, at his last visit he scored a 2.  He endorses adequate sleep and appetite.  Today he denies SI/HI/VAH, mania, paranoia.   Today propranolol ordered at 10 mg to be taken 4 times daily.  He will continue all other medications as prescribed.  No other concerns at this time.  No other  concerns at this time     Visit Diagnosis:    ICD-10-CM   1. Generalized anxiety disorder  F41.1 gabapentin (NEURONTIN) 300 MG capsule    hydrOXYzine (ATARAX) 25 MG tablet    propranolol (INDERAL) 10 MG tablet    2. Recurrent major depressive disorder, in partial remission (HCC)  F33.41 gabapentin (NEURONTIN) 300 MG capsule    propranolol (INDERAL) 10 MG tablet         Past Psychiatric History: depression, anxiety, substance induced mood disorder, suicide attempt, and opioid use disorder  Past Medical History:  Past Medical History:  Diagnosis Date   Anxiety    Depressed    Depression    Heroin abuse (HCC)    Opiate addiction (HCC)    Seizures (HCC)    childhood   Substance abuse (HCC)    History abuse hx. Clean for 6 months.    Past Surgical History:  Procedure Laterality Date   NO PAST SURGERIES      Family Psychiatric History: Anxiety, depression, Substance induced mood disorder (opiates)  Family History:  Family History  Family history unknown: Yes    Social History:  Social History   Socioeconomic History   Marital status: Single    Spouse name: Not on file   Number of children: Not on file   Years of education: Not on file   Highest education level: Not on file  Occupational History   Occupation: "treework"  Tobacco Use   Smoking status: Every Day    Current packs/day: 0.50    Types: Cigarettes   Smokeless  tobacco: Never  Vaping Use   Vaping status: Never Used  Substance and Sexual Activity   Alcohol use: Not Currently   Drug use: Yes    Types: Heroin, Benzodiazepines, IV, Hydrocodone, Oxycodone    Comment: Heroin    Sexual activity: Yes  Other Topics Concern   Not on file  Social History Narrative   ** Merged History Encounter **       Social Determinants of Health   Financial Resource Strain: Not on file  Food Insecurity: Not on file  Transportation Needs: Not on file  Physical Activity: Not on file  Stress: Not on file  Social  Connections: Unknown (02/15/2023)   Received [facility]   Social Network    Social Network: Not on file    Allergies: No Known Allergies  Metabolic Disorder Labs: Lab Results  Component Value Date   HGBA1C 5.0 01/31/2022   MPG 96.8 01/31/2022   No results found for: "PROLACTIN" Lab Results  Component Value Date   CHOL 175 01/31/2022   TRIG 122 01/31/2022   HDL 43 01/31/2022   CHOLHDL 4.1 01/31/2022   VLDL 24 01/31/2022   LDLCALC 108 (H) 01/31/2022   Lab Results  Component Value Date   TSH 1.946 01/31/2022   TSH 0.320 (L) 08/18/2017    Therapeutic Level Labs: No results found for: "LITHIUM" No results found for: "VALPROATE" No results found for: "CBMZ"  Current Medications: Current Outpatient Medications  Medication Sig Dispense Refill   gabapentin (NEURONTIN) 300 MG capsule Take 2 capsules (600 mg total) by mouth 3 (three) times daily. 180 capsule 3   hydrOXYzine (ATARAX) 25 MG tablet Take 1 tablet (25 mg total) by mouth 3 (three) times daily as needed. 90 tablet 3   ondansetron (ZOFRAN-ODT) 4 MG disintegrating tablet Take 1 tablet (4 mg total) by mouth every 6 (six) hours as needed for nausea or vomiting. 20 tablet 0   propranolol (INDERAL) 10 MG tablet Take 4 tablets (40 mg total) by mouth 4 (four) times daily. 120 tablet 3   No current facility-administered medications for this visit.     Musculoskeletal: Strength & Muscle Tone: within normal limits, telehealth visit Gait & Station: normal, telehealth visit Patient leans: N/A  Psychiatric Specialty Exam: Review of Systems  There were no vitals taken for this visit.There is no height or weight on file to calculate BMI.  General Appearance: Well Groomed  Eye Contact:  Good  Speech:  Clear and Coherent and Normal Rate  Volume:  Normal  Mood:  Euthymic  Affect:  Appropriate and Congruent  Thought Process:  Coherent, Goal Directed and Linear  Orientation:  Full (Time, Place, and Person)  Thought  Content: WDL and Logical   Suicidal Thoughts:  No  Homicidal Thoughts:  No  Memory:  Immediate;   Good Recent;   Good Remote;   Good  Judgement:  Good  Insight:  Good  Psychomotor Activity:  Normal  Concentration:  Concentration: Good and Attention Span: Good  Recall:  Good  Fund of Knowledge: Good  Language: Good  Akathisia:  No  Handed:  Right  AIMS (if indicated): Not done  Assets:  Communication Skills Desire for Improvement Financial Resources/Insurance Housing Social Support  ADL's:  Intact  Cognition: WNL  Sleep:  Good   Screenings: AIMS    Flowsheet Row [facility]  AIMS Total Score 0      AUDIT    Flowsheet Row  Ad[facility]  Alcohol Use Disorder Identification Test Final Score (AUDIT) 0      GAD-7    Flowsheet Row Video Visit from 11/03/2023 in [facility] Video Visit from 08/25/2023 in [facility] Video Visit from 06/09/2023 in [facility] Video Visit from 04/07/2023 in [facility] Video Visit from 01/24/2023 in [facility]  Total GAD-7 Score 4 3 4 4 8       PHQ2-9    Flowsheet Row Video Visit from 11/03/2023 in [facility] Video Visit from 08/25/2023 in [facility] Video Visit from 06/09/2023 in [facility] Video Visit from 04/07/2023 in [facility] Video Visit from 01/24/2023 in [facility]  PHQ-2 Total Score 1 1 1 1  0  PHQ-9 Total Score 3 2 6 3 2       Flowsheet Row ED from 01/31/2022 in [facility] ED from 01/30/2022 in [facility] Emergency Department at [facility] Video Visit from 01/27/2022 in  [facility]  C-SSRS RISK CATEGORY No Risk No Risk No Risk        Assessment and Plan: Patient reports that his anxiety depression are well-managed.  He informed Clinical research associate that at times he pops his propranolol in half and asked writer to order 10 mg. Today propranolol ordered at 10 mg to be taken 4 times daily.  He will continue all other medications as prescribed.  No other concerns at this time.   1. Generalized anxiety disorder  Continue- gabapentin (NEURONTIN) 300 MG capsule; Take 2 capsules (600 mg total) by mouth 3 (three) times daily.  Dispense: 180 capsule; Refill: 3 Continue- hydrOXYzine (ATARAX) 25 MG tablet; Take 1 tablet (25 mg total) by mouth 3 (three) times daily as needed.  Dispense: 90 tablet; Refill: 3 Reduced- propranolol (INDERAL) 10 MG tablet; Take 4 tablets (40 mg total) by mouth 4 (four) times daily.  Dispense: 120 tablet; Refill: 3  2. Recurrent major depressive disorder, in partial remission (HCC)  Continue- gabapentin (NEURONTIN) 300 MG capsule; Take 2 capsules (600 mg total) by mouth 3 (three) times daily.  Dispense: 180 capsule; Refill: 3 Reduced- propranolol (INDERAL) 10 MG tablet; Take 4 tablets (40 mg total) by mouth 4 (four) times daily.  Dispense: 120 tablet; Refill: 3   Follow-up in 2 months Follow-up with therapy Shanna Cisco, NP 11/03/2023, 10:30 AM

## 2023-11-03 ENCOUNTER — Telehealth (INDEPENDENT_AMBULATORY_CARE_PROVIDER_SITE_OTHER): Payer: MEDICAID | Admitting: Psychiatry

## 2023-11-03 ENCOUNTER — Encounter (HOSPITAL_COMMUNITY): Payer: Self-pay | Admitting: Psychiatry

## 2023-11-03 DIAGNOSIS — F411 Generalized anxiety disorder: Secondary | ICD-10-CM

## 2023-11-03 DIAGNOSIS — F3341 Major depressive disorder, recurrent, in partial remission: Secondary | ICD-10-CM | POA: Diagnosis not present

## 2023-11-03 MED ORDER — GABAPENTIN 300 MG PO CAPS: 600.0000 mg | ORAL_CAPSULE | Freq: Three times a day (TID) | ORAL | 3 refills | Status: DC

## 2023-11-03 MED ORDER — HYDROXYZINE HCL 25 MG PO TABS: 25.0000 mg | ORAL_TABLET | Freq: Three times a day (TID) | ORAL | 3 refills | Status: DC | PRN

## 2023-11-03 MED ORDER — PROPRANOLOL HCL 10 MG PO TABS: 40.0000 mg | ORAL_TABLET | Freq: Four times a day (QID) | ORAL | 3 refills | Status: DC

## 2023-11-29 ENCOUNTER — Telehealth (HOSPITAL_COMMUNITY): Payer: Self-pay | Admitting: *Deleted

## 2023-11-29 ENCOUNTER — Other Ambulatory Visit (HOSPITAL_COMMUNITY): Payer: Self-pay | Admitting: Psychiatry

## 2023-11-29 DIAGNOSIS — F411 Generalized anxiety disorder: Secondary | ICD-10-CM

## 2023-11-29 DIAGNOSIS — F3341 Major depressive disorder, recurrent, in partial remission: Secondary | ICD-10-CM

## 2023-11-29 MED ORDER — PROPRANOLOL HCL 10 MG PO TABS: 10.0000 mg | ORAL_TABLET | Freq: Four times a day (QID) | ORAL | 3 refills | Status: DC

## 2023-11-29 NOTE — Telephone Encounter (Signed)
Order was written and sent to preferred pharmacy.  Provider called your pharmacy for clarification as well.

## 2023-11-29 NOTE — Telephone Encounter (Signed)
VM from [place] to have provider clarify his Inderal RX. Currently written as Inderal 10 mg 4 tabs four time a day. I will forward this concern to Dr Doyne Keel to clarify.

## 2023-12-25 ENCOUNTER — Other Ambulatory Visit (HOSPITAL_COMMUNITY): Payer: Self-pay | Admitting: Psychiatry

## 2023-12-25 DIAGNOSIS — F411 Generalized anxiety disorder: Secondary | ICD-10-CM

## 2023-12-25 DIAGNOSIS — F3341 Major depressive disorder, recurrent, in partial remission: Secondary | ICD-10-CM

## 2024-01-05 ENCOUNTER — Telehealth (INDEPENDENT_AMBULATORY_CARE_PROVIDER_SITE_OTHER): Payer: MEDICAID | Admitting: Psychiatry

## 2024-01-05 ENCOUNTER — Encounter (HOSPITAL_COMMUNITY): Payer: Self-pay | Admitting: Psychiatry

## 2024-01-05 DIAGNOSIS — F3341 Major depressive disorder, recurrent, in partial remission: Secondary | ICD-10-CM

## 2024-01-05 DIAGNOSIS — F411 Generalized anxiety disorder: Secondary | ICD-10-CM | POA: Diagnosis not present

## 2024-01-05 MED ORDER — HYDROXYZINE HCL 25 MG PO TABS: 25.0000 mg | ORAL_TABLET | Freq: Three times a day (TID) | ORAL | 3 refills | Status: DC | PRN

## 2024-01-05 MED ORDER — GABAPENTIN 300 MG PO CAPS: 600.0000 mg | ORAL_CAPSULE | Freq: Three times a day (TID) | ORAL | 3 refills | Status: DC

## 2024-01-05 MED ORDER — PROPRANOLOL HCL 10 MG PO TABS: 10.0000 mg | ORAL_TABLET | Freq: Four times a day (QID) | ORAL | 3 refills | Status: DC

## 2024-01-05 NOTE — Progress Notes (Signed)
BH MD/PA/NP OP Progress Note Virtual Visit via Video Note  I connected with Mark Strickland on 01/05/24 at  8:00 AM EST by a video enabled telemedicine application and verified that I am speaking with the correct person using two identifiers.  Location: Patient: Home Provider: Clinic   I discussed the limitations of evaluation and management by telemedicine and the availability of in person appointments. The patient expressed understanding and agreed to proceed.  I provided 30 minutes of non-face-to-face time during this encounter.    01/05/2024 8:03 AM Mark Strickland  MRN:  [id number]  Chief Complaint: "Everything is goog"  HPI: 33 year old male seen today for follow up psychiatric evaluation. He has a psychiatric history of depression, anxiety, substance induced mood disorder, suicide attempt, and opioid use disorder (in remission). He is currently being managed on, Gabapentin 600mg  TID, Propranolol 10 mg four daily, and Hydroxyzine 25mg  TID (takes nightly as it is sedating).  He reports his medications are effective in managing his psychiatric conditions.   Today he was well groomed, pleasant, cooperative, engaged in conversation and maintained eye contact.  He informed provider that everything is good. He notes that his mood is stable and notes that he has minimal anxiety and depression. Today provider conducted a GAD-7 and patient scored a 1, at his last visit he scored a 4.  Provider also conducted PHQ-9 and patient scored 2, at his last visit he scored a 3.  He endorses adequate sleep and appetite.  Today he denies SI/HI/VAH, mania, paranoia.   No medication changes made today.  Patient will continue his medications as prescribed.  No other concerns at this time.    Visit Diagnosis:    ICD-10-CM   1. Generalized anxiety disorder  F41.1 hydrOXYzine (ATARAX) 25 MG tablet    propranolol (INDERAL) 10 MG tablet    gabapentin (NEURONTIN) 300 MG capsule    2. Recurrent major depressive  disorder, in partial remission (HCC)  F33.41 propranolol (INDERAL) 10 MG tablet    gabapentin (NEURONTIN) 300 MG capsule          Past Psychiatric History: depression, anxiety, substance induced mood disorder, suicide attempt, and opioid use disorder  Past Medical History:  Past Medical History:  Diagnosis Date   Anxiety    Depressed    Depression    Heroin abuse (HCC)    Opiate addiction (HCC)    Seizures (HCC)    childhood   Substance abuse (HCC)    History abuse hx. Clean for 6 months.    Past Surgical History:  Procedure Laterality Date   NO PAST SURGERIES      Family Psychiatric History: Anxiety, depression, Substance induced mood disorder (opiates)  Family History:  Family History  Family history unknown: Yes    Social History:  Social History   Socioeconomic History   Marital status: Single    Spouse name: Not on file   Number of children: Not on file   Years of education: Not on file   Highest education level: Not on file  Occupational History   Occupation: "treework"  Tobacco Use   Smoking status: Every Day    Current packs/day: 0.50    Types: Cigarettes   Smokeless tobacco: Never  Vaping Use   Vaping status: Never Used  Substance and Sexual Activity   Alcohol use: Not Currently   Drug use: Yes    Types: Heroin, Benzodiazepines, IV, Hydrocodone, Oxycodone    Comment: Heroin    Sexual activity: Yes  Other Topics  Concern   Not on file  Social History Narrative   ** Merged History Encounter **       Social Drivers of Health   Financial Resource Strain: Not on file  Food Insecurity: Not on file  Transportation Needs: Not on file  Physical Activity: Not on file  Stress: Not on file  Social Connections: Unknown (02/15/2023)   [facility]   Social Network    Social Network: Not on file    Allergies: No Known Allergies  Metabolic Disorder Labs: Lab Results  Component Value Date   HGBA1C 5.0 01/31/2022   MPG 96.8  01/31/2022   No results found for: "PROLACTIN" Lab Results  Component Value Date   CHOL 175 01/31/2022   TRIG 122 01/31/2022   HDL 43 01/31/2022   CHOLHDL 4.1 01/31/2022   VLDL 24 01/31/2022   LDLCALC 108 (H) 01/31/2022   Lab Results  Component Value Date   TSH 1.946 01/31/2022   TSH 0.320 (L) 08/18/2017    Therapeutic Level Labs: No results found for: "LITHIUM" No results found for: "VALPROATE" No results found for: "CBMZ"  Current Medications: Current Outpatient Medications  Medication Sig Dispense Refill   gabapentin (NEURONTIN) 300 MG capsule Take 2 capsules (600 mg total) by mouth 3 (three) times daily. 180 capsule 3   hydrOXYzine (ATARAX) 25 MG tablet Take 1 tablet (25 mg total) by mouth 3 (three) times daily as needed. 90 tablet 3   ondansetron (ZOFRAN-ODT) 4 MG disintegrating tablet Take 1 tablet (4 mg total) by mouth every 6 (six) hours as needed for nausea or vomiting. 20 tablet 0   propranolol (INDERAL) 10 MG tablet Take 1 tablet (10 mg total) by mouth 4 (four) times daily. 120 tablet 3   No current facility-administered medications for this visit.     Musculoskeletal: Strength & Muscle Tone: within normal limits, telehealth visit Gait & Station: normal, telehealth visit Patient leans: N/A  Psychiatric Specialty Exam: Review of Systems  There were no vitals taken for this visit.There is no height or weight on file to calculate BMI.  General Appearance: Well Groomed  Eye Contact:  Good  Speech:  Clear and Coherent and Normal Rate  Volume:  Normal  Mood:  Euthymic  Affect:  Appropriate and Congruent  Thought Process:  Coherent, Goal Directed and Linear  Orientation:  Full (Time, Place, and Person)  Thought Content: WDL and Logical   Suicidal Thoughts:  No  Homicidal Thoughts:  No  Memory:  Immediate;   Good Recent;   Good Remote;   Good  Judgement:  Good  Insight:  Good  Psychomotor Activity:  Normal  Concentration:  Concentration: Good and  Attention Span: Good  Recall:  Good  Fund of Knowledge: Good  Language: Good  Akathisia:  No  Handed:  Right  AIMS (if indicated): Not done  Assets:  Communication Skills Desire for Improvement Financial Resources/Insurance Housing Social Support  ADL's:  Intact  Cognition: WNL  Sleep:  Good   Screenings: AIMS    Flowsheet Row [facility]  AIMS Total Score 0      AUDIT    Flowsheet Row [facility]  Alcohol Use Disorder Identification Test Final Score (AUDIT) 0      GAD-7    Flowsheet Row Video Visit from 01/05/2024 in [facility] Video Visit from 11/03/2023 in [facility] Video Visit from 08/25/2023 in [facility] Video Visit from 06/09/2023 in [facility] Video Visit from 04/07/2023 in [facility]  Total GAD-7 Score 1 4 3 4 4       PHQ2-9    Flowsheet Row Video Visit [facility] 01/05/2024 [facility] Video Visit from 11/03/2023 in [facility] Video Visit from 08/25/2023 in [facility] Video Visit from 06/09/2023 in [facility] Video Visit from 04/07/2023 in [facility]  PHQ-2 Total Score 2 1 1 1 1   PHQ-9 Total Score 2 3 2 6 3       Flowsheet Row ED from 01/31/2022 in [facility] ED from 01/30/2022 in [facility] Department at [facility] Video Visit from 01/27/2022 in [facility]  C-SSRS RISK CATEGORY No Risk No Risk No Risk        Assessment and Plan: Patient reports that he is doing well on his current medication regimen. No medication changes made today.  Patient will  continue his medications as prescribed.  1. Generalized anxiety disorder  Continue- gabapentin (NEURONTIN) 300 MG capsule; Take 2 capsules (600 mg total) by mouth 3 (three) times daily.  Dispense: 180 capsule; Refill: 3 Continue- hydrOXYzine (ATARAX) 25 MG tablet; Take 1 tablet (25 mg total) by mouth 3 (three) times daily as needed.  Dispense: 90 tablet; Refill: 3 continue- propranolol (INDERAL) 10 MG tablet; Take 4 tablets (40 mg total) by mouth 4 (four) times daily.  Dispense: 120 tablet; Refill: 3  2. Recurrent major depressive disorder, in partial remission (HCC)  Continue- gabapentin (NEURONTIN) 300 MG capsule; Take 2 capsules (600 mg total) by mouth 3 (three) times daily.  Dispense: 180 capsule; Refill: 3 Contninue- propranolol (INDERAL) 10 MG tablet; Take 4 tablets (40 mg total) by mouth 4 (four) times daily.  Dispense: 120 tablet; Refill: 3   Follow-up in 2 months Follow-up with therapy Shanna Cisco, NP 01/05/2024, 8:03 AM

## 2024-01-22 ENCOUNTER — Other Ambulatory Visit (HOSPITAL_COMMUNITY): Payer: Self-pay | Admitting: Psychiatry

## 2024-01-22 DIAGNOSIS — F411 Generalized anxiety disorder: Secondary | ICD-10-CM

## 2024-01-22 DIAGNOSIS — F3341 Major depressive disorder, recurrent, in partial remission: Secondary | ICD-10-CM

## 2024-02-21 ENCOUNTER — Other Ambulatory Visit (HOSPITAL_COMMUNITY): Payer: Self-pay | Admitting: Psychiatry

## 2024-02-21 DIAGNOSIS — F411 Generalized anxiety disorder: Secondary | ICD-10-CM

## 2024-02-21 DIAGNOSIS — F3341 Major depressive disorder, recurrent, in partial remission: Secondary | ICD-10-CM

## 2024-02-26 ENCOUNTER — Telehealth (HOSPITAL_COMMUNITY): Payer: Self-pay | Admitting: Psychiatry

## 2024-02-28 ENCOUNTER — Encounter (HOSPITAL_COMMUNITY): Payer: Self-pay | Admitting: Psychiatry

## 2024-02-28 ENCOUNTER — Telehealth (INDEPENDENT_AMBULATORY_CARE_PROVIDER_SITE_OTHER): Payer: MEDICAID | Admitting: Psychiatry

## 2024-02-28 DIAGNOSIS — F3341 Major depressive disorder, recurrent, in partial remission: Secondary | ICD-10-CM | POA: Diagnosis not present

## 2024-02-28 DIAGNOSIS — F411 Generalized anxiety disorder: Secondary | ICD-10-CM | POA: Diagnosis not present

## 2024-02-28 MED ORDER — MIRTAZAPINE 30 MG PO TABS: 30.0000 mg | ORAL_TABLET | Freq: Every day | ORAL | 3 refills | Status: DC

## 2024-02-28 MED ORDER — HYDROXYZINE HCL 25 MG PO TABS: 25.0000 mg | ORAL_TABLET | Freq: Three times a day (TID) | ORAL | 3 refills | Status: DC | PRN

## 2024-02-28 MED ORDER — GABAPENTIN 300 MG PO CAPS: 600.0000 mg | ORAL_CAPSULE | Freq: Three times a day (TID) | ORAL | 3 refills | Status: DC

## 2024-02-28 MED ORDER — PROPRANOLOL HCL 10 MG PO TABS: 10.0000 mg | ORAL_TABLET | Freq: Four times a day (QID) | ORAL | 3 refills | Status: DC

## 2024-02-28 NOTE — Progress Notes (Signed)
 BH MD/PA/NP OP Progress Note Virtual Visit via Video Note  I connected with Mark Strickland on 02/28/24 at  9:00 AM EDT by a video enabled telemedicine application and verified that I am speaking with the correct person using two identifiers.  Location: Patient: Home Provider: Clinic   I discussed the limitations of evaluation and management by telemedicine and the availability of in person appointments. The patient expressed understanding and agreed to proceed.  I provided 30 minutes of non-face-to-face time during this encounter.    02/28/2024 9:25 AM Mark Strickland  MRN:  [id number]  Chief Complaint: "I need refills of mirtazapine and I need a form filled out for the [employer]"  HPI: 33 year old male seen today for follow up psychiatric evaluation. He has a psychiatric history of depression, anxiety, substance induced mood disorder, suicide attempt, and opioid use disorder (in remission). He is currently being managed on, Gabapentin 600mg  TID, Propranolol 10 mg four daily, and Hydroxyzine 25mg  TID (takes nightly as it is sedating).  He reports that he restarted an old prescription of Mirtazapine 30 mg. He now notes that his medications are effective in managing his psychiatric conditions.   Today he was well groomed, pleasant, cooperative, engaged in conversation and maintained eye contact.  He informed provider that he is in need of Mirtazapine refills. He also notes that he needs medical clearance from writer to present to the [employer]. Provider informed patient to email the forms over to review. He endorsed understanding and agreed.    Today provider conducted a GAD-7 and patient scored a 1, at his last visit he scored a 1.  Provider also conducted PHQ-9 and patient scored 1, at his last visit he scored a 2.  He endorses adequate sleep and appetite.  Today he denies SI/HI/VAH, mania, paranoia.   Today Mirtazapine 30 mg nightly reordered. No other medication changes made today.  Patient will  continue his medications as prescribed.  No other concerns at this time.    Visit Diagnosis:    ICD-10-CM   1. Generalized anxiety disorder  F41.1 propranolol (INDERAL) 10 MG tablet    gabapentin (NEURONTIN) 300 MG capsule    hydrOXYzine (ATARAX) 25 MG tablet    mirtazapine (REMERON) 30 MG tablet    2. Recurrent major depressive disorder, in partial remission (HCC)  F33.41 propranolol (INDERAL) 10 MG tablet    gabapentin (NEURONTIN) 300 MG capsule    mirtazapine (REMERON) 30 MG tablet           Past Psychiatric History: depression, anxiety, substance induced mood disorder, suicide attempt, and opioid use disorder  Past Medical History:  Past Medical History:  Diagnosis Date   Anxiety    Depressed    Depression    Heroin abuse (HCC)    Opiate addiction (HCC)    Seizures (HCC)    childhood   Substance abuse (HCC)    History abuse hx. Clean for 6 months.    Past Surgical History:  Procedure Laterality Date   NO PAST SURGERIES      Family Psychiatric History: Anxiety, depression, Substance induced mood disorder (opiates)  Family History:  Family History  Family history unknown: Yes    Social History:  Social History   Socioeconomic History   Marital status: Single    Spouse name: Not on file   Number of children: Not on file   Years of education: Not on file   Highest education level: Not on file  Occupational History   Occupation: "treework"  Tobacco  Use   Smoking status: Every Day    Current packs/day: 0.50    Types: Cigarettes   Smokeless tobacco: Never  Vaping Use   Vaping status: Never Used  Substance and Sexual Activity   Alcohol use: Not Currently   Drug use: Yes    Types: Heroin, Benzodiazepines, IV, Hydrocodone, Oxycodone    Comment: Heroin    Sexual activity: Yes  Other Topics Concern   Not on file  Social History Narrative   ** Merged History Encounter **       Social Drivers of Health   Financial Resource Strain: Not on file   Food Insecurity: Not on file  Transportation Needs: Not on file  Physical Activity: Not on file  Stress: Not on file  Social Connections: Unknown (02/15/2023)   Received from [facility]   Social Network    Social Network: Not on file    Allergies: No Known Allergies  Metabolic Disorder Labs: Lab Results  Component Value Date   HGBA1C 5.0 01/31/2022   MPG 96.8 01/31/2022   No results found for: "PROLACTIN" Lab Results  Component Value Date   CHOL 175 01/31/2022   TRIG 122 01/31/2022   HDL 43 01/31/2022   CHOLHDL 4.1 01/31/2022   VLDL 24 01/31/2022   LDLCALC 108 (H) 01/31/2022   Lab Results  Component Value Date   TSH 1.946 01/31/2022   TSH 0.320 (L) 08/18/2017    Therapeutic Level Labs: No results found for: "LITHIUM" No results found for: "VALPROATE" No results found for: "CBMZ"  Current Medications: Current Outpatient Medications  Medication Sig Dispense Refill   mirtazapine (REMERON) 30 MG tablet Take 1 tablet (30 mg total) by mouth at bedtime. 30 tablet 3   gabapentin (NEURONTIN) 300 MG capsule Take 2 capsules (600 mg total) by mouth 3 (three) times daily. 180 capsule 3   hydrOXYzine (ATARAX) 25 MG tablet Take 1 tablet (25 mg total) by mouth 3 (three) times daily as needed. 90 tablet 3   ondansetron (ZOFRAN-ODT) 4 MG disintegrating tablet Take 1 tablet (4 mg total) by mouth every 6 (six) hours as needed for nausea or vomiting. 20 tablet 0   propranolol (INDERAL) 10 MG tablet Take 1 tablet (10 mg total) by mouth 4 (four) times daily. 120 tablet 3   No current facility-administered medications for this visit.     Musculoskeletal: Strength & Muscle Tone: within normal limits, telehealth visit Gait & Station: normal, telehealth visit Patient leans: N/A  Psychiatric Specialty Exam: Review of Systems  There were no vitals taken for this visit.There is no height or weight on file to calculate BMI.  General Appearance: Well Groomed  Eye Contact:  Good   Speech:  Clear and Coherent and Normal Rate  Volume:  Normal  Mood:  Euthymic  Affect:  Appropriate and Congruent  Thought Process:  Coherent, Goal Directed and Linear  Orientation:  Full (Time, Place, and Person)  Thought Content: WDL and Logical   Suicidal Thoughts:  No  Homicidal Thoughts:  No  Memory:  Immediate;   Good Recent;   Good Remote;   Good  Judgement:  Good  Insight:  Good  Psychomotor Activity:  Normal  Concentration:  Concentration: Good and Attention Span: Good  Recall:  Good  Fund of Knowledge: Good  Language: Good  Akathisia:  No  Handed:  Right  AIMS (if indicated): Not done  Assets:  Communication Skills Desire for Improvement Financial Resources/Insurance Housing Social Support  ADL's:  Intact  Cognition:  WNL  Sleep:  Good   Screenings: AIMS    Flowsheet Row [facility]  AIMS Total Score 0      AUDIT    Flowsheet Row Ad[facility]  Alcohol Use Disorder Identification Test Final Score (AUDIT) 0      GAD-7    Flowsheet Row Video Visit from 02/28/2024 in [facility] Video Visit from 01/05/2024 in [facility] Video Visit from 11/03/2023 in [facility] Video Visit from 08/25/2023 in [facility] Video Visit from 06/09/2023 in [facility]  Total GAD-7 Score 1 1 4 3 4       PHQ2-9    Flowsheet Row Video Visit from 02/28/2024 in [facility] Video Visit from 01[facility]05/2024 in [facility] Video Visit from 11/03/2023 in [facility] Video Visit from 08/25/2023 in [facility] Video Visit from 06/09/2023 in [facility]  PHQ-2 Total Score 0  2 1 1 1   PHQ-9 Total Score 1 2 3 2 6       Flowsheet Row ED from 01/31/2022 in [facility] ED from 01/30/2022 in [facility] Emergency Department at [facility] Video Visit from 01/27/2022 in [facility]  C-SSRS RISK CATEGORY No Risk No Risk No Risk        Assessment and Plan: Patient reports that he is doing well on his current medication regimen. Today Mirtazapine 30 mg nightly reordered. No other medication changes made today.  Patient will continue his medications as prescribed.   1. Generalized anxiety disorder  Continue- propranolol (INDERAL) 10 MG tablet; Take 1 tablet (10 mg total) by mouth 4 (four) times daily.  Dispense: 120 tablet; Refill: 3 Continue- gabapentin (NEURONTIN) 300 MG capsule; Take 2 capsules (600 mg total) by mouth 3 (three) times daily.  Dispense: 180 capsule; Refill: 3 Continue- hydrOXYzine (ATARAX) 25 MG tablet; Take 1 tablet (25 mg total) by mouth 3 (three) times daily as needed.  Dispense: 90 tablet; Refill: 3 Continue- mirtazapine (REMERON) 30 MG tablet; Take 1 tablet (30 mg total) by mouth at bedtime.  Dispense: 30 tablet; Refill: 3  2. Recurrent major depressive disorder, in partial remission (HCC)  Continue- propranolol (INDERAL) 10 MG tablet; Take 1 tablet (10 mg total) by mouth 4 (four) times daily.  Dispense: 120 tablet; Refill: 3 Continue- gabapentin (NEURONTIN) 300 MG capsule; Take 2 capsules (600 mg total) by mouth 3 (three) times daily.  Dispense: 180 capsule; Refill: 3 Continue- mirtazapine (REMERON) 30 MG tablet; Take 1 tablet (30 mg total) by mouth at bedtime.  Dispense: 30 tablet; Refill: 3  Follow-up in 2.5 months Follow-up with therapy Shanna Cisco, NP 02/28/2024, 9:25 AM

## 2024-02-28 NOTE — Telephone Encounter (Signed)
 Provider addressed patient concerns today at his visit.

## 2024-03-08 ENCOUNTER — Telehealth (HOSPITAL_COMMUNITY): Payer: No Payment, Other | Admitting: Psychiatry

## 2024-04-26 ENCOUNTER — Telehealth (HOSPITAL_COMMUNITY): Payer: Self-pay | Admitting: Psychiatry

## 2024-04-29 NOTE — Telephone Encounter (Signed)
 Provider reviewed confirmation that form was faxed.  Provider also was informed that patient received email of his completed form.  Patient confirmed that he found his forms and notes that he will fax it back to the [facility].

## 2024-05-08 ENCOUNTER — Telehealth (INDEPENDENT_AMBULATORY_CARE_PROVIDER_SITE_OTHER): Payer: MEDICAID | Admitting: Psychiatry

## 2024-05-08 ENCOUNTER — Encounter (HOSPITAL_COMMUNITY): Payer: Self-pay | Admitting: Psychiatry

## 2024-05-08 DIAGNOSIS — F3341 Major depressive disorder, recurrent, in partial remission: Secondary | ICD-10-CM

## 2024-05-08 DIAGNOSIS — F411 Generalized anxiety disorder: Secondary | ICD-10-CM

## 2024-05-08 MED ORDER — MIRTAZAPINE 30 MG PO TABS: 30.0000 mg | ORAL_TABLET | Freq: Every day | ORAL | 3 refills | Status: DC

## 2024-05-08 MED ORDER — PROPRANOLOL HCL 10 MG PO TABS: 10.0000 mg | ORAL_TABLET | Freq: Four times a day (QID) | ORAL | 3 refills | Status: DC

## 2024-05-08 MED ORDER — GABAPENTIN 300 MG PO CAPS: 600.0000 mg | ORAL_CAPSULE | Freq: Three times a day (TID) | ORAL | 3 refills | Status: DC

## 2024-05-08 MED ORDER — HYDROXYZINE HCL 25 MG PO TABS: 25.0000 mg | ORAL_TABLET | Freq: Three times a day (TID) | ORAL | 3 refills | Status: DC | PRN

## 2024-05-08 NOTE — Progress Notes (Signed)
 BH MD/PA/NP OP Progress Note Virtual Visit via Video Note  I connected with Mark Strickland on 05/08/24 at  9:00 AM EDT by a video enabled telemedicine application and verified that I am speaking with the correct person using two identifiers.  Location: Patient: Home Provider: Clinic   I discussed the limitations of evaluation and management by telemedicine and the availability of in person appointments. The patient expressed understanding and agreed to proceed.  I provided 30 minutes of non-face-to-face time during this encounter.    05/08/2024 9:06 AM Mark Strickland  MRN:  [id number]  Chief Complaint: "I am doing fine"  HPI: 33 year old male seen today for follow up psychiatric evaluation. He has a psychiatric history of depression, anxiety, substance induced mood disorder, suicide attempt, and opioid use disorder (in remission). He is currently being managed on, Gabapentin  600mg  TID, Propranolol  10 mg four daily, and Hydroxyzine  25mg  TID (takes nightly as it is sedating).  He reports that he restarted an old prescription of Mirtazapine  30 mg. He now notes that his medications are effective in managing his psychiatric conditions.   Today he was well groomed, pleasant, cooperative, engaged in conversation and maintained eye contact.  He informed provider that he is doing fine and notes that he has minimal anxiety and depression.Patient notes that he continues to wait on clearence from the [facility]. Patients DNV forms filled out prior to this visit. It was emailed to patient and faxed to the [facility].    Today provider conducted a GAD-7 and patient scored a 0, at his last visit he scored a 1.  Provider also conducted PHQ-9 and patient scored 0, at his last visit he scored a 1.  He endorses adequate sleep and appetite.  Today he denies SI/HI/VAH, mania, paranoia.    No medication changes made today.  Patient will continue his medications as prescribed.  No other concerns at this time.    Visit  Diagnosis:    ICD-10-CM   1. Generalized anxiety disorder  F41.1 propranolol  (INDERAL ) 10 MG tablet    hydrOXYzine  (ATARAX ) 25 MG tablet    mirtazapine  (REMERON ) 30 MG tablet    gabapentin  (NEURONTIN ) 300 MG capsule    2. Recurrent major depressive disorder, in partial remission (HCC)  F33.41 propranolol  (INDERAL ) 10 MG tablet    mirtazapine  (REMERON ) 30 MG tablet    gabapentin  (NEURONTIN ) 300 MG capsule            Past Psychiatric History: depression, anxiety, substance induced mood disorder, suicide attempt, and opioid use disorder  Past Medical History:  Past Medical History:  Diagnosis Date   Anxiety    Depressed    Depression    Heroin abuse (HCC)    Opiate addiction (HCC)    Seizures (HCC)    childhood   Substance abuse (HCC)    History abuse hx. Clean for 6 months.    Past Surgical History:  Procedure Laterality Date   NO PAST SURGERIES      Family Psychiatric History: Anxiety, depression, Substance induced mood disorder (opiates)  Family History:  Family History  Family history unknown: Yes    Social History:  Social History   Socioeconomic History   Marital status: Single    Spouse name: Not on file   Number of children: Not on file   Years of education: Not on file   Highest education level: Not on file  Occupational History   Occupation: "treework"  Tobacco Use   Smoking status: Every Day  Current packs/day: 0.50    Types: Cigarettes   Smokeless tobacco: Never  Vaping Use   Vaping status: Never Used  Substance and Sexual Activity   Alcohol use: Not Currently   Drug use: Yes    Types: Heroin, Benzodiazepines, IV, Hydrocodone, Oxycodone    Comment: Heroin    Sexual activity: Yes  Other Topics Concern   Not on file  Social History Narrative   ** Merged History Encounter **       Social Drivers of Health   Financial Resource Strain: Not on file  Food Insecurity: Not on file  Transportation Needs: Not on file  Physical Activity:  Not on file  Stress: Not on file  Social Connections: Unknown (02/15/2023)   [facility] from [facility]   Social Network    Social Network: Not on file    Allergies: No Known Allergies  Metabolic Disorder Labs: Lab Results  Component Value Date   HGBA1C 5.0 01/31/2022   MPG 96.8 01/31/2022   No results found for: "PROLACTIN" Lab Results  Component Value Date   CHOL 175 01/31/2022   TRIG 122 01/31/2022   HDL 43 01/31/2022   CHOLHDL 4.1 01/31/2022   VLDL 24 01/31/2022   LDLCALC 108 (H) 01/31/2022   Lab Results  Component Value Date   TSH 1.946 01/31/2022   TSH 0.320 (L) 08/18/2017    Therapeutic Level Labs: No results found for: "LITHIUM" No results found for: "VALPROATE" No results found for: "CBMZ"  Current Medications: Current Outpatient Medications  Medication Sig Dispense Refill   gabapentin  (NEURONTIN ) 300 MG capsule Take 2 capsules (600 mg total) by mouth 3 (three) times daily. 180 capsule 3   hydrOXYzine  (ATARAX ) 25 MG tablet Take 1 tablet (25 mg total) by mouth 3 (three) times daily as needed. 90 tablet 3   mirtazapine  (REMERON ) 30 MG tablet Take 1 tablet (30 mg total) by mouth at bedtime. 30 tablet 3   ondansetron  (ZOFRAN -ODT) 4 MG disintegrating tablet Take 1 tablet (4 mg total) by mouth every 6 (six) hours as needed for nausea or vomiting. 20 tablet 0   propranolol  (INDERAL ) 10 MG tablet Take 1 tablet (10 mg total) by mouth 4 (four) times daily. 120 tablet 3   No current facility-administered medications for this visit.     Musculoskeletal: Strength & Muscle Tone: within normal limits, telehealth visit Gait & Station: normal, telehealth visit Patient leans: N/A  Psychiatric Specialty Exam: Review of Systems  There were no vitals taken for this visit.There is no height or weight on file to calculate BMI.  General Appearance: Well Groomed  Eye Contact:  Good  Speech:  Clear and Coherent and Normal Rate  Volume:  Normal  Mood:  Euthymic  Affect:   Appropriate and Congruent  Thought Process:  Coherent, Goal Directed and Linear  Orientation:  Full (Time, Place, and Person)  Thought Content: WDL and Logical   Suicidal Thoughts:  No  Homicidal Thoughts:  No  Memory:  Immediate;   Good Recent;   Good Remote;   Good  Judgement:  Good  Insight:  Good  Psychomotor Activity:  Normal  Concentration:  Concentration: Good and Attention Span: Good  Recall:  Good  Fund of Knowledge: Good  Language: Good  Akathisia:  No  Handed:  Right  AIMS (if indicated): Not done  Assets:  Communication Skills Desire for Improvement Financial Resources/Insurance Housing Social Support  ADL's:  Intact  Cognition: WNL  Sleep:  Good   Screenings: AIMS  Flowsheet Row [facility]  AIMS Total Score 0      AUDIT    Flowsheet Row [facility]  Alcohol Use Disorder Identification Test Final Score (AUDIT) 0      GAD-7    Flowsheet Row Video Visit from 05/08/2024 in [facility] Video Visit from 02/28/2024 in [facility] Video Visit from 01/05/2024 in [facility] Video Visit from 11/03/2023 in [facility] Video Visit from 08/25/2023 in [facility]  Total GAD-7 Score 0 1 1 4 3       PHQ2-9    Flowsheet Row Video Visit from 05/08/2024 in [facility] Video Visit from 02/28/2024 in [facility] Video Visit from 01/05/2024 in [facility] Video Visit from 11/03/2023 in [facility] Video Visit from 08/25/2023 in [facility]  PHQ-2 Total Score 0 0 2 1 1   PHQ-9 Total Score 0 1 2 3 2       Flowsheet Row ED from 01/31/2022 in [facility] ED from 01/30/2022 in [facility] Emergency Department at [facility] Video Visit from 01/27/2022 in [facility]  C-SSRS RISK CATEGORY No Risk No Risk No Risk        Assessment and Plan: Patient reports that he is doing well on his current medication regimen.  No medication changes made today. Patient agreeable to continue medications as prescribed.  1. Generalized anxiety disorder  Continue- propranolol  (INDERAL ) 10 MG tablet; Take 1 tablet (10 mg total) by mouth 4 (four) times daily.  Dispense: 120 tablet; Refill: 3 Continue- gabapentin  (NEURONTIN ) 300 MG capsule; Take 2 capsules (600 mg total) by mouth 3 (three) times daily.  Dispense: 180 capsule; Refill: 3 Continue- hydrOXYzine  (ATARAX ) 25 MG tablet; Take 1 tablet (25 mg total) by mouth 3 (three) times daily as needed.  Dispense: 90 tablet; Refill: 3 Continue- mirtazapine  (REMERON ) 30 MG tablet; Take 1 tablet (30 mg total) by mouth at bedtime.  Dispense: 30 tablet; Refill: 3  2. Recurrent major depressive disorder, in partial remission (HCC)  Continue- propranolol  (INDERAL ) 10 MG tablet; Take 1 tablet (10 mg total) by mouth 4 (four) times daily.  Dispense: 120 tablet; Refill: 3 Continue- gabapentin  (NEURONTIN ) 300 MG capsule; Take 2 capsules (600 mg total) by mouth 3 (three) times daily.  Dispense: 180 capsule; Refill: 3 Continue- mirtazapine  (REMERON ) 30 MG tablet; Take 1 tablet (30 mg total) by mouth at bedtime.  Dispense: 30 tablet; Refill: 3  Follow-up in 2.5 months Follow-up with therapy Arlyne Bering, NP 05/08/2024, 9:06 AM

## 2024-06-28 ENCOUNTER — Other Ambulatory Visit (HOSPITAL_COMMUNITY): Payer: Self-pay | Admitting: Psychiatry

## 2024-06-28 DIAGNOSIS — F411 Generalized anxiety disorder: Secondary | ICD-10-CM

## 2024-06-28 DIAGNOSIS — F3341 Major depressive disorder, recurrent, in partial remission: Secondary | ICD-10-CM

## 2024-07-04 ENCOUNTER — Encounter (HOSPITAL_COMMUNITY): Payer: Self-pay | Admitting: Family

## 2024-07-04 ENCOUNTER — Telehealth (HOSPITAL_COMMUNITY): Payer: MEDICAID | Admitting: Family

## 2024-07-04 DIAGNOSIS — F411 Generalized anxiety disorder: Secondary | ICD-10-CM | POA: Diagnosis not present

## 2024-07-04 DIAGNOSIS — F3341 Major depressive disorder, recurrent, in partial remission: Secondary | ICD-10-CM

## 2024-07-04 NOTE — Progress Notes (Signed)
 BH MD/PA/NP OP Progress Note Virtual Visit via Video Note  I connected with [name] on 07/04/24 at  3:00 PM EDT by a video enabled telemedicine application and verified that I am speaking with the correct person using two identifiers.  Location: Patient: Home Provider: Clinic   I discussed the limitations of evaluation and management by telemedicine and the availability of in person appointments. The patient expressed understanding and agreed to proceed.  I provided 30 minutes of non-face-to-face time during this encounter.    07/04/2024 2:54 PM Mark Strickland  MRN:  [id number]  Chief Complaint: I am doing fine  HPI: 33 year old male seen today for follow up psychiatric evaluation. He has a psychiatric history of depression, anxiety, substance induced mood disorder, suicide attempt, and opioid use disorder (in remission). He is currently being managed on, Gabapentin  600mg  TID, Propranolol  10 mg four daily, and Hydroxyzine  25mg  TID (takes nightly as it is sedating).  He reports that he restarted an old prescription of Mirtazapine  30 mg. He now notes that his medications are effective in managing his psychiatric conditions.   Patient was seen today for follow-up and presented as well-groomed, pleasant, cooperative, and engaged in conversation, maintaining appropriate eye contact throughout the encounter. He reported that he is doing fine overall, noting minimal symptoms of anxiety and depression. He stated that he continues to wait on clearance from the [facility], and the necessary DMV forms were completed prior to this visit. The completed forms were emailed to the patient and faxed to the [facility].  A GAD-7 was administered today, and the patient scored 0, improved from a score of 1 at his last visit. A PHQ-9 was also completed, with a score of 0, also improved from a previous score of 1. He reports adequate sleep and appetite and denies suicidal or homicidal ideation, auditory or visual  hallucinations, paranoia, or manic symptoms. No changes were made to his medication regimen, and he will continue taking all prescribed medications. No additional concerns were reported at this time.   Visit Diagnosis:  No diagnosis found.   Past Psychiatric History: depression, anxiety, substance induced mood disorder, suicide attempt, and opioid use disorder  Past Medical History:  Past Medical History:  Diagnosis Date   Anxiety    Depressed    Depression    Heroin abuse (HCC)    Opiate addiction (HCC)    Seizures (HCC)    childhood   Substance abuse (HCC)    History abuse hx. Clean for 6 months.    Past Surgical History:  Procedure Laterality Date   NO PAST SURGERIES      Family Psychiatric History: Anxiety, depression, Substance induced mood disorder (opiates)  Family History:  Family History  Family history unknown: Yes    Social History:  Social History   Socioeconomic History   Marital status: Single    Spouse name: Not on file   Number of children: Not on file   Years of education: Not on file   Highest education level: Not on file  Occupational History   Occupation: treework  Tobacco Use   Smoking status: Every Day    Current packs/day: 0.50    Types: Cigarettes   Smokeless tobacco: Never  Vaping Use   Vaping status: Never Used  Substance and Sexual Activity   Alcohol use: Not Currently   Drug use: Yes    Types: Heroin, Benzodiazepines, IV, Hydrocodone, Oxycodone    Comment: Heroin    Sexual activity: Yes  Other Topics Concern  Not on file  Social History Narrative   ** Merged History Encounter **       Social Drivers of Health   Financial Resource Strain: Not on file  Food Insecurity: Not on file  Transportation Needs: Not on file  Physical Activity: Not on file  Stress: Not on file  Social Connections: Unknown (02/15/2023)   Received from [facility]   Social Network    Social Network: Not on file    Allergies: No Known  Allergies  Metabolic Disorder Labs: Lab Results  Component Value Date   HGBA1C 5.0 01/31/2022   MPG 96.8 01/31/2022   No results found for: PROLACTIN Lab Results  Component Value Date   CHOL 175 01/31/2022   TRIG 122 01/31/2022   HDL 43 01/31/2022   CHOLHDL 4.1 01/31/2022   VLDL 24 01/31/2022   LDLCALC 108 (H) 01/31/2022   Lab Results  Component Value Date   TSH 1.946 01/31/2022   TSH 0.320 (L) 08/18/2017    Therapeutic Level Labs: No results found for: LITHIUM No results found for: VALPROATE No results found for: CBMZ  Current Medications: Current Outpatient Medications  Medication Sig Dispense Refill   gabapentin  (NEURONTIN ) 300 MG capsule Take 2 capsules (600 mg total) by mouth 3 (three) times daily. 180 capsule 3   hydrOXYzine  (ATARAX ) 25 MG tablet Take 1 tablet (25 mg total) by mouth 3 (three) times daily as needed. 90 tablet 3   mirtazapine  (REMERON ) 30 MG tablet Take 1 tablet (30 mg total) by mouth at bedtime. 30 tablet 3   ondansetron  (ZOFRAN -ODT) 4 MG disintegrating tablet Take 1 tablet (4 mg total) by mouth every 6 (six) hours as needed for nausea or vomiting. 20 tablet 0   propranolol  (INDERAL ) 10 MG tablet Take 1 tablet (10 mg total) by mouth 4 (four) times daily. 120 tablet 3   No current facility-administered medications for this visit.     Musculoskeletal: Strength & Muscle Tone: within normal limits, telehealth visit Gait & Station: normal, telehealth visit Patient leans: N/A  Psychiatric Specialty Exam: Review of Systems  There were no vitals taken for this visit.There is no height or weight on file to calculate BMI.  General Appearance: Well Groomed  Eye Contact:  Good  Speech:  Clear and Coherent and Normal Rate  Volume:  Normal  Mood:  Euthymic  Affect:  Appropriate and Congruent  Thought Process:  Coherent, Goal Directed and Linear  Orientation:  Full (Time, Place, and Person)  Thought Content: WDL and Logical   Suicidal  Thoughts:  No  Homicidal Thoughts:  No  Memory:  Immediate;   Good Recent;   Good Remote;   Good  Judgement:  Good  Insight:  Good  Psychomotor Activity:  Normal  Concentration:  Concentration: Good and Attention Span: Good  Recall:  Good  Fund of Knowledge: Good  Language: Good  Akathisia:  No  Handed:  Right  AIMS (if indicated): Not done  Assets:  Communication Skills Desire for Improvement Financial Resources/Insurance Housing Social Support  ADL's:  Intact  Cognition: WNL  Sleep:  Good   Screenings: AIMS    Flowsheet Row [facility]  AIMS Total Score 0   AUDIT    Flowsheet Row Ad[facility] ([facility]  Alcohol Use Disorder Identification Test Final Score (AUDIT) 0   GAD-7    Flowsheet Row Video Visit from 05/08/2024 in [facility] Video Visit from 02/28/2024 in [facility] Video Visit from 01/05/2024 in [facility] Video Visit from 11/03/2023 in [facility] Video Visit from 08/25/2023 in [facility]  Total GAD-7 Score 0 1 1 4 3    PHQ2-9    Flowsheet Row Video Visit from 05/08/2024 in [facility] Video Visit from 02/28/2024 in [facility] Video Visit from 01/05/2024 in [facility] Video Visit from 11/03/2023 in [facility] Video Visit from 08/25/2023 in [facility]  PHQ-2 Total Score 0 0 2 1 1   PHQ-9 Total Score 0 1 2 3 2    Flowsheet Row ED from 01/31/2022 in [facility] ED from 01/30/2022 in [facility] Emergency Department at [facility] Video Visit from 01/27/2022 in [facility]  C-SSRS RISK  CATEGORY No Risk No Risk No Risk     Assessment and Plan: Patient reports that he is doing well on his current medication regimen.  No medication changes made today. Patient agreeable to continue medications as prescribed.  1. Generalized anxiety disorder  Continue- propranolol  (INDERAL ) 10 MG tablet; Take 1 tablet (10 mg total) by mouth 4 (four) times daily.  Dispense: 120 tablet; Refill: 3 Continue- gabapentin  (NEURONTIN ) 300 MG capsule; Take 2 capsules (600 mg total) by mouth 3 (three) times daily.  Dispense: 180 capsule; Refill: 3 Continue- hydrOXYzine  (ATARAX ) 25 MG tablet; Take 1 tablet (25 mg total) by mouth 3 (three) times daily as needed.  Dispense: 90 tablet; Refill: 3 Continue- mirtazapine  (REMERON ) 30 MG tablet; Take 1 tablet (30 mg total) by mouth at bedtime.  Dispense: 30 tablet; Refill: 3  2. Recurrent major depressive disorder, in partial remission (HCC)  Continue- propranolol  (INDERAL ) 10 MG tablet; Take 1 tablet (10 mg total) by mouth 4 (four) times daily.  Dispense: 120 tablet; Refill: 3 Continue- gabapentin  (NEURONTIN ) 300 MG capsule; Take 2 capsules (600 mg total) by mouth 3 (three) times daily.  Dispense: 180 capsule; Refill: 3 Continue- mirtazapine  (REMERON ) 30 MG tablet; Take 1 tablet (30 mg total) by mouth at bedtime.  Dispense: 30 tablet; Refill: 3  Follow-up in 2.5 months Follow-up with therapy Majel GORMAN Ramp, FNP 07/04/2024, 2:54 PM

## 2024-08-12 ENCOUNTER — Telehealth (HOSPITAL_COMMUNITY): Payer: MEDICAID | Admitting: Psychiatry

## 2024-10-15 ENCOUNTER — Telehealth (INDEPENDENT_AMBULATORY_CARE_PROVIDER_SITE_OTHER): Payer: MEDICAID | Admitting: Psychiatry

## 2024-10-15 ENCOUNTER — Encounter (HOSPITAL_COMMUNITY): Payer: Self-pay | Admitting: Psychiatry

## 2024-10-15 DIAGNOSIS — F411 Generalized anxiety disorder: Secondary | ICD-10-CM | POA: Diagnosis not present

## 2024-10-15 DIAGNOSIS — F3341 Major depressive disorder, recurrent, in partial remission: Secondary | ICD-10-CM | POA: Diagnosis not present

## 2024-10-15 MED ORDER — MIRTAZAPINE 30 MG PO TABS: 30.0000 mg | ORAL_TABLET | Freq: Every day | ORAL | 3 refills | Status: DC

## 2024-10-15 MED ORDER — PROPRANOLOL HCL 10 MG PO TABS: 10.0000 mg | ORAL_TABLET | Freq: Four times a day (QID) | ORAL | 3 refills | Status: DC

## 2024-10-15 MED ORDER — HYDROXYZINE HCL 25 MG PO TABS: 25.0000 mg | ORAL_TABLET | Freq: Three times a day (TID) | ORAL | 3 refills | Status: DC | PRN

## 2024-10-15 MED ORDER — GABAPENTIN 300 MG PO CAPS: 600.0000 mg | ORAL_CAPSULE | Freq: Three times a day (TID) | ORAL | 3 refills | Status: DC

## 2024-10-15 NOTE — Progress Notes (Signed)
 BH MD/PA/NP OP Progress Note Virtual Visit via Video Note  I connected with Mark Strickland on 10/15/24 at  3:30 PM EDT by a video enabled telemedicine application and verified that I am speaking with the correct person using two identifiers.  Location: Patient: Home Provider: Clinic   I discussed the limitations of evaluation and management by telemedicine and the availability of in person appointments. The patient expressed understanding and agreed to proceed.  I provided 30 minutes of non-face-to-face time during this encounter.    10/15/2024 2:33 PM Mark Strickland  MRN:  [id number]  Chief Complaint: I am doing fine  HPI: 33 year old male seen today for follow up psychiatric evaluation. He has a psychiatric history of depression, anxiety, substance induced mood disorder, suicide attempt, and opioid use disorder (in remission). He is currently being managed on, Gabapentin  600mg  TID, Propranolol  10 mg four daily, Hydroxyzine  25mg  TID (takes nightly as it is sedating) and Mirtazapine  30 mg. He now notes that his medications are effective in managing his psychiatric conditions.   Today he was well groomed, pleasant, cooperative, engaged in conversation and maintained eye contact.  He informed provider that he is doing well.  He notes that his mood is stable he reports that he has minimal anxiety and depression.   Today provider conducted a GAD-7 and patient scored a 2. Provider also conducted PHQ-9 and patient scored 1.  He endorses adequate sleep and appetite.  Today he denies SI/HI/VAH, mania, paranoia.   Provider reviewed patient's chart and saw that he has had hernia.  Provider asked patient his condition was managed.  He notes that he has an upcoming appointment.  He notes that he is discomfort when he eats but notes that it is manageable.  He denies weight loss.   No medication changes made today.  Patient will continue his medications as prescribed.  No other concerns at this  time.    Visit Diagnosis:    ICD-10-CM   1. Generalized anxiety disorder  F41.1 propranolol  (INDERAL ) 10 MG tablet    hydrOXYzine  (ATARAX ) 25 MG tablet    mirtazapine  (REMERON ) 30 MG tablet    gabapentin  (NEURONTIN ) 300 MG capsule    2. Recurrent major depressive disorder, in partial remission  F33.41 propranolol  (INDERAL ) 10 MG tablet    mirtazapine  (REMERON ) 30 MG tablet    gabapentin  (NEURONTIN ) 300 MG capsule             Past Psychiatric History: depression, anxiety, substance induced mood disorder, suicide attempt, and opioid use disorder  Past Medical History:  Past Medical History:  Diagnosis Date   Anxiety    Depressed    Depression    Heroin abuse (HCC)    Opiate addiction (HCC)    Seizures (HCC)    childhood   Substance abuse (HCC)    History abuse hx. Clean for 6 months.    Past Surgical History:  Procedure Laterality Date   NO PAST SURGERIES      Family Psychiatric History: Anxiety, depression, Substance induced mood disorder (opiates)  Family History:  Family History  Family history unknown: Yes    Social History:  Social History   Socioeconomic History   Marital status: Single    Spouse name: Not on file   Number of children: Not on file   Years of education: Not on file   Highest education level: Not on file  Occupational History   Occupation: treework  Tobacco Use   Smoking status: Every Day  Current packs/day: 0.50    Types: Cigarettes   Smokeless tobacco: Never  Vaping Use   Vaping status: Never Used  Substance and Sexual Activity   Alcohol use: Not Currently   Drug use: Yes    Types: Heroin, Benzodiazepines, IV, Hydrocodone, Oxycodone    Comment: Heroin    Sexual activity: Yes  Other Topics Concern   Not on file  Social History Narrative   ** Merged History Encounter **       Social Drivers of Health   Financial Resource Strain: Not on file  Food Insecurity: Not on file  Transportation Needs: Not on file   Physical Activity: Not on file  Stress: Not on file  Social Connections: Unknown (02/15/2023)   [facility]   Social Network    Social Network: Not on file    Allergies: No Known Allergies  Metabolic Disorder Labs: Lab Results  Component Value Date   HGBA1C 5.0 01/31/2022   MPG 96.8 01/31/2022   No results found for: PROLACTIN Lab Results  Component Value Date   CHOL 175 01/31/2022   TRIG 122 01/31/2022   HDL 43 01/31/2022   CHOLHDL 4.1 01/31/2022   VLDL 24 01/31/2022   LDLCALC 108 (H) 01/31/2022   Lab Results  Component Value Date   TSH 1.946 01/31/2022   TSH 0.320 (L) 08/18/2017    Therapeutic Level Labs: No results found for: LITHIUM No results found for: VALPROATE No results found for: CBMZ  Current Medications: Current Outpatient Medications  Medication Sig Dispense Refill   gabapentin  (NEURONTIN ) 300 MG capsule Take 2 capsules (600 mg total) by mouth 3 (three) times daily. 180 capsule 3   hydrOXYzine  (ATARAX ) 25 MG tablet Take 1 tablet (25 mg total) by mouth 3 (three) times daily as needed. 90 tablet 3   mirtazapine  (REMERON ) 30 MG tablet Take 1 tablet (30 mg total) by mouth at bedtime. 30 tablet 3   ondansetron  (ZOFRAN -ODT) 4 MG disintegrating tablet Take 1 tablet (4 mg total) by mouth every 6 (six) hours as needed for nausea or vomiting. 20 tablet 0   propranolol  (INDERAL ) 10 MG tablet Take 1 tablet (10 mg total) by mouth 4 (four) times daily. 120 tablet 3   No current facility-administered medications for this visit.     Musculoskeletal: Strength & Muscle Tone: within normal limits, telehealth visit Gait & Station: normal, telehealth visit Patient leans: N/A  Psychiatric Specialty Exam: Review of Systems  There were no vitals taken for this visit.There is no height or weight on file to calculate BMI.  General Appearance: Well Groomed  Eye Contact:  Good  Speech:  Clear and Coherent and Normal Rate  Volume:  Normal  Mood:   Euthymic  Affect:  Appropriate and Congruent  Thought Process:  Coherent, Goal Directed and Linear  Orientation:  Full (Time, Place, and Person)  Thought Content: WDL and Logical   Suicidal Thoughts:  No  Homicidal Thoughts:  No  Memory:  Immediate;   Good Recent;   Good Remote;   Good  Judgement:  Good  Insight:  Good  Psychomotor Activity:  Normal  Concentration:  Concentration: Good and Attention Span: Good  Recall:  Good  Fund of Knowledge: Good  Language: Good  Akathisia:  No  Handed:  Right  AIMS (if indicated): Not done  Assets:  Communication Skills Desire for Improvement Financial Resources/Insurance Housing Social Support  ADL's:  Intact  Cognition: WNL  Sleep:  Good   Screenings: AIMS  Flowsheet Row [facility]  AIMS Total Score 0   AUDIT    Flowsheet Row [facility]  Alcohol Use Disorder Identification Test Final Score (AUDIT) 0   GAD-7    Flowsheet Row Video Visit from 10/15/2024 in [facility] Video Visit from 05/08/2024 in [facility] Video Visit from 02/28/2024 in [facility] Video Visit from 01/05/2024 in [facility] Video Visit from 11/03/2023 in [facility]  Total GAD-7 Score 2 0 1 1 4    PHQ2-9    Flowsheet Row Video Visit from 10/15/2024 in [facility] Video Visit from 05/08/2024 in [facility] Video Visit from 02/28/2024 in [facility] Video Visit from 01/05/2024 in [facility] Video Visit from 11/03/2023 in [facility]  PHQ-2 Total Score 0 0 0 2 1  PHQ-9 Total Score 1 0 1 2 3    Flowsheet Row ED from 01/31/2022 in  [facility] ED from 01/30/2022 in [facility] Emergency Department at [facility] Video Visit from 01/27/2022 in [facility]  C-SSRS RISK CATEGORY No Risk No Risk No Risk     Assessment and Plan: Patient reports that he is doing well on his current medication regimen.  No medication changes made today. Patient agreeable to continue medications as prescribed.  1. Generalized anxiety disorder  Continue- propranolol  (INDERAL ) 10 MG tablet; Take 1 tablet (10 mg total) by mouth 4 (four) times daily.  Dispense: 120 tablet; Refill: 3 Continue- gabapentin  (NEURONTIN ) 300 MG capsule; Take 2 capsules (600 mg total) by mouth 3 (three) times daily.  Dispense: 180 capsule; Refill: 3 Continue- hydrOXYzine  (ATARAX ) 25 MG tablet; Take 1 tablet (25 mg total) by mouth 3 (three) times daily as needed.  Dispense: 90 tablet; Refill: 3 Continue- mirtazapine  (REMERON ) 30 MG tablet; Take 1 tablet (30 mg total) by mouth at bedtime.  Dispense: 30 tablet; Refill: 3  2. Recurrent major depressive disorder, in partial remission (HCC)  Continue- propranolol  (INDERAL ) 10 MG tablet; Take 1 tablet (10 mg total) by mouth 4 (four) times daily.  Dispense: 120 tablet; Refill: 3 Continue- gabapentin  (NEURONTIN ) 300 MG capsule; Take 2 capsules (600 mg total) by mouth 3 (three) times daily.  Dispense: 180 capsule; Refill: 3 Continue- mirtazapine  (REMERON ) 30 MG tablet; Take 1 tablet (30 mg total) by mouth at bedtime.  Dispense: 30 tablet; Refill: 3  Follow-up in 2.5 months Follow-up with therapy Zane FORBES Bach, NP 10/15/2024, 2:33 PM

## 2025-01-02 ENCOUNTER — Telehealth (INDEPENDENT_AMBULATORY_CARE_PROVIDER_SITE_OTHER): Payer: MEDICAID | Admitting: Psychiatry

## 2025-01-02 ENCOUNTER — Encounter (HOSPITAL_COMMUNITY): Payer: Self-pay | Admitting: Psychiatry

## 2025-01-02 DIAGNOSIS — F411 Generalized anxiety disorder: Secondary | ICD-10-CM

## 2025-01-02 DIAGNOSIS — F3341 Major depressive disorder, recurrent, in partial remission: Secondary | ICD-10-CM | POA: Diagnosis not present

## 2025-01-02 MED ORDER — GABAPENTIN 300 MG PO CAPS: 600.0000 mg | ORAL_CAPSULE | Freq: Three times a day (TID) | ORAL | 3 refills | Status: AC

## 2025-01-02 MED ORDER — HYDROXYZINE HCL 25 MG PO TABS: 25.0000 mg | ORAL_TABLET | Freq: Three times a day (TID) | ORAL | 3 refills | Status: AC | PRN

## 2025-01-02 MED ORDER — MIRTAZAPINE 30 MG PO TABS: 30.0000 mg | ORAL_TABLET | Freq: Every day | ORAL | 3 refills | Status: AC

## 2025-01-02 MED ORDER — PROPRANOLOL HCL 10 MG PO TABS: 10.0000 mg | ORAL_TABLET | Freq: Four times a day (QID) | ORAL | 3 refills | Status: AC

## 2025-01-02 NOTE — Progress Notes (Signed)
 BH MD/PA/NP OP Progress Note Virtual Visit via Video Note  I connected with [name] on 01/02/25 at  3:00 PM EST by a video enabled telemedicine application and verified that I am speaking with the correct person using two identifiers.  Location: Patient: Home Provider: Clinic   I discussed the limitations of evaluation and management by telemedicine and the availability of in person appointments. The patient expressed understanding and agreed to proceed.  I provided 30 minutes of non-face-to-face time during this encounter.    01/02/2025 9:54 AM Mark Strickland  MRN:  [id number]  Chief Complaint: I am doing well  HPI: 34 year old male seen today for follow up psychiatric evaluation. He has a psychiatric history of depression, anxiety, substance induced mood disorder, suicide attempt, and opioid use disorder (in remission). He is currently being managed on, Gabapentin  600mg  TID, Propranolol  10 mg four daily, Hydroxyzine  25mg  TID (takes nightly as it is sedating) and Mirtazapine  30 mg. He now notes that his medications are effective in managing his psychiatric conditions.   Today he was well groomed, pleasant, cooperative, engaged in conversation and maintained eye contact.  He informed provider that he is doing well.  He notes that he has no concerns today and reports that overall his anxiety and depression are well-managed.  Today provider conducted a GAD-7 and patient scored a 3, at his last visit he scored a 2. Provider also conducted PHQ-9 and patient scored 2, at his last visit he scored a 1.  He endorses adequate sleep and appetite.  Today he denies SI/HI/VAH, mania, paranoia.   Overall patient notes that he is doing well.  No medication changes made today.  Patient will continue his medications as prescribed.  No other concerns at this time.    Visit Diagnosis:    ICD-10-CM   1. Generalized anxiety disorder  F41.1 gabapentin  (NEURONTIN ) 300 MG capsule    mirtazapine   (REMERON ) 30 MG tablet    hydrOXYzine  (ATARAX ) 25 MG tablet    propranolol  (INDERAL ) 10 MG tablet    2. Recurrent major depressive disorder, in partial remission  F33.41 gabapentin  (NEURONTIN ) 300 MG capsule    mirtazapine  (REMERON ) 30 MG tablet    propranolol  (INDERAL ) 10 MG tablet              Past Psychiatric History: depression, anxiety, substance induced mood disorder, suicide attempt, and opioid use disorder  Past Medical History:  Past Medical History:  Diagnosis Date   Anxiety    Depressed    Depression    Heroin abuse (HCC)    Opiate addiction (HCC)    Seizures (HCC)    childhood   Substance abuse (HCC)    History abuse hx. Clean for 6 months.    Past Surgical History:  Procedure Laterality Date   NO PAST SURGERIES      Family Psychiatric History: Anxiety, depression, Substance induced mood disorder (opiates)  Family History:  Family History  Family history unknown: Yes    Social History:  Social History   Socioeconomic History   Marital status: Single    Spouse name: Not on file   Number of children: Not on file   Years of education: Not on file   Highest education level: Not on file  Occupational History   Occupation: treework  Tobacco Use   Smoking status: Every Day    Current packs/day: 0.50    Types: Cigarettes   Smokeless tobacco: Never  Vaping Use   Vaping status: Never Used  Substance  and Sexual Activity   Alcohol use: Not Currently   Drug use: Yes    Types: Heroin, Benzodiazepines, IV, Hydrocodone, Oxycodone    Comment: Heroin    Sexual activity: Yes  Other Topics Concern   Not on file  Social History Narrative   ** Merged History Encounter **       Social Drivers of Health   Tobacco Use: Low Risk (11/18/2024)   Received from [facility]   Patient History    Smoking Tobacco Use: Never    Smokeless Tobacco Use: Never    Passive Exposure: Not on file  Recent Concern: Tobacco Use - High Risk (10/15/2024)   Patient  History    Smoking Tobacco Use: Every Day    Smokeless Tobacco Use: Never    Passive Exposure: Not on file  Financial Resource Strain: Low Risk (11/15/2024)   Received from [facility]   Overall Financial Resource Strain (CARDIA)    How hard is it for you to pay for the very basics like food, housing, medical care, and heating?: Not very hard  Food Insecurity: No Food Insecurity (11/15/2024)   Received from [facility]   Epic    Within the past 12 months, you worried that your food would run out before you got the money to buy more.: Never true    Within the past 12 months, the food you bought just didn't last and you didn't have money to get more.: Never true  Transportation Needs: No Transportation Needs (11/15/2024)   Received from [facility]    In the past 12 months, has lack of transportation kept you from medical appointments or from getting medications?: No    In the past 12 months, has lack of transportation kept you from meetings, work, or from getting things needed for daily living?: No  Physical Activity: Sufficiently Active (11/15/2024)   Received from [facility]   Exercise Vital Sign    On average, how many days per week do you engage in moderate to strenuous exercise (like a brisk walk)?: 7 days    On average, how many minutes do you engage in exercise at this level?: 60 min  Stress: No Stress Concern Present (11/15/2024)   Received from [facility] - Occupational Stress Questionnaire    Do you feel stress - tense, restless, nervous, or anxious, or unable to sleep at night because your mind is troubled all the time - these days?: Only a little  Social Connections: Socially Integrated (11/15/2024)   Received [facility]   Social Network    How would you rate your social network (family, work, friends)?: Good participation with social networks  Depression (PHQ2-9): Low Risk (01/02/2025)   Depression  (PHQ2-9)    PHQ-2 Score: 2  Alcohol Screen: Not on file  Housing: Low Risk (11/15/2024)   Received from [facility]    In the last 12 months, was there a time when you were not able to pay the mortgage or rent on time?: No    In the past 12 months, how many times have you moved where you were living?: 0    At any time in the past 12 months, were you homeless or living in a shelter (including now)?: No  Utilities: Not At Risk (11/15/2024)   Received from [facility]    In the past 12 months has the electric, gas, oil, or water   company threatened to shut off services in your home?: No  Health Literacy: Not on file    Allergies: No Known Allergies  Metabolic Disorder Labs: Lab Results  Component Value Date   HGBA1C 5.0 01/31/2022   MPG 96.8 01/31/2022   No results found for: PROLACTIN Lab Results  Component Value Date   CHOL 175 01/31/2022   TRIG 122 01/31/2022   HDL 43 01/31/2022   CHOLHDL 4.1 01/31/2022   VLDL 24 01/31/2022   LDLCALC 108 (H) 01/31/2022   Lab Results  Component Value Date   TSH 1.946 01/31/2022   TSH 0.320 (L) 08/18/2017    Therapeutic Level Labs: No results found for: LITHIUM No results found for: VALPROATE No results found for: CBMZ  Current Medications: Current Outpatient Medications  Medication Sig Dispense Refill   gabapentin  (NEURONTIN ) 300 MG capsule Take 2 capsules (600 mg total) by mouth 3 (three) times daily. 180 capsule 3   hydrOXYzine  (ATARAX ) 25 MG tablet Take 1 tablet (25 mg total) by mouth 3 (three) times daily as needed. 90 tablet 3   mirtazapine  (REMERON ) 30 MG tablet Take 1 tablet (30 mg total) by mouth at bedtime. 30 tablet 3   ondansetron  (ZOFRAN -ODT) 4 MG disintegrating tablet Take 1 tablet (4 mg total) by mouth every 6 (six) hours as needed for nausea or vomiting. 20 tablet 0   propranolol  (INDERAL ) 10 MG tablet Take 1 tablet (10 mg total) by mouth 4 (four) times daily. 120 tablet 3   No current  facility-administered medications for this visit.     Musculoskeletal: Strength & Muscle Tone: within normal limits, telehealth visit Gait & Station: normal, telehealth visit Patient leans: N/A  Psychiatric Specialty Exam: Review of Systems  There were no vitals taken for this visit.There is no height or weight on file to calculate BMI.  General Appearance: Well Groomed  Eye Contact:  Good  Speech:  Clear and Coherent and Normal Rate  Volume:  Normal  Mood:  Euthymic  Affect:  Appropriate and Congruent  Thought Process:  Coherent, Goal Directed and Linear  Orientation:  Full (Time, Place, and Person)  Thought Content: WDL and Logical   Suicidal Thoughts:  No  Homicidal Thoughts:  No  Memory:  Immediate;   Good Recent;   Good Remote;   Good  Judgement:  Good  Insight:  Good  Psychomotor Activity:  Normal  Concentration:  Concentration: Good and Attention Span: Good  Recall:  Good  Fund of Knowledge: Good  Language: Good  Akathisia:  No  Handed:  Right  AIMS (if indicated): Not done  Assets:  Communication Skills Desire for Improvement Financial Resources/Insurance Housing Social Support  ADL's:  Intact  Cognition: WNL  Sleep:  Good   Screenings: AIMS    Flowsheet Row [facility]  AIMS Total Score 0   AUDIT    Flowsheet Row Ad[facility]  Alcohol Use Disorder Identification Test Final Score (AUDIT) 0   GAD-7    Flowsheet Row Video Visit from 01/02/2025 in [facility] Video Visit from 10/15/2024 in [facility] Video Visit from 05/08/2024 in [facility] Video Visit from 02/28/2024 in [facility] Video Visit from 01/05/2024 in [facility]  Total GAD-7 Score 3 2 0 1 1   PHQ2-9     Flowsheet Row Video Visit  from 01/02/2025 in [facility] Video Visit from 10/15/2024 in [facility] Video Visit from 05/08/2024 in [facility] Video Visit from 02/28/2024 in [facility] Video Visit from 01/05/2024 in [facility]  PHQ-2 Total Score 0 0 0 0 2  PHQ-9 Total Score 2 1 0 1 2   Flowsheet Row ED from 01/31/2022 in [facility] ED from 01/30/2022 in [facility] Emergency Department at [facility] Video Visit from 01/27/2022 in [facility]  C-SSRS RISK CATEGORY No Risk No Risk No Risk     Assessment and Plan: Patient reports that he is doing well on his current medication regimen.  No medication changes made today. Patient agreeable to continue medications as prescribed.  1. Generalized anxiety disorder  Continue- propranolol  (INDERAL ) 10 MG tablet; Take 1 tablet (10 mg total) by mouth 4 (four) times daily.  Dispense: 120 tablet; Refill: 3 Continue- gabapentin  (NEURONTIN ) 300 MG capsule; Take 2 capsules (600 mg total) by mouth 3 (three) times daily.  Dispense: 180 capsule; Refill: 3 Continue- hydrOXYzine  (ATARAX ) 25 MG tablet; Take 1 tablet (25 mg total) by mouth 3 (three) times daily as needed.  Dispense: 90 tablet; Refill: 3 Continue- mirtazapine  (REMERON ) 30 MG tablet; Take 1 tablet (30 mg total) by mouth at bedtime.  Dispense: 30 tablet; Refill: 3  2. Recurrent major depressive disorder, in partial remission (HCC)  Continue- propranolol  (INDERAL ) 10 MG tablet; Take 1 tablet (10 mg total) by mouth 4 (four) times daily.  Dispense: 120 tablet; Refill: 3 Continue- gabapentin  (NEURONTIN ) 300 MG capsule; Take 2 capsules (600 mg total) by mouth 3 (three) times daily.  Dispense: 180 capsule; Refill: 3 Continue- mirtazapine  (REMERON ) 30 MG tablet; Take 1 tablet (30 mg total) by mouth at  bedtime.  Dispense: 30 tablet; Refill: 3  Follow-up in 2.5 months Follow-up with therapy Zane FORBES Bach, NP 01/02/2025, 9:54 AM

## 2025-03-05 ENCOUNTER — Telehealth (HOSPITAL_COMMUNITY): Payer: MEDICAID | Admitting: Psychiatry
